# Patient Record
Sex: Female | Born: 1945 | Race: White | Hispanic: No | Marital: Married | Smoking: Former smoker
Health system: Southern US, Community
[De-identification: ages and names within clinical notes are randomized; demographics above are authoritative.]

## PROBLEM LIST (undated history)

## (undated) DIAGNOSIS — R55 Syncope and collapse: Secondary | ICD-10-CM

## (undated) DIAGNOSIS — I2510 Atherosclerotic heart disease of native coronary artery without angina pectoris: Secondary | ICD-10-CM

## (undated) DIAGNOSIS — J449 Chronic obstructive pulmonary disease, unspecified: Secondary | ICD-10-CM

## (undated) DIAGNOSIS — N183 Chronic kidney disease, stage 3 unspecified: Secondary | ICD-10-CM

## (undated) DIAGNOSIS — E119 Type 2 diabetes mellitus without complications: Secondary | ICD-10-CM

## (undated) DIAGNOSIS — I5042 Chronic combined systolic (congestive) and diastolic (congestive) heart failure: Secondary | ICD-10-CM

## (undated) DIAGNOSIS — I428 Other cardiomyopathies: Secondary | ICD-10-CM

## (undated) HISTORY — PX: APPENDECTOMY: SHX54

## (undated) HISTORY — PX: TUBAL LIGATION: SHX77

## (undated) HISTORY — DX: Chronic kidney disease, stage 3 (moderate): N18.3

## (undated) HISTORY — DX: Chronic kidney disease, stage 3 unspecified: N18.30

## (undated) HISTORY — PX: PACEMAKER INSERTION: SHX728

## (undated) HISTORY — DX: Chronic combined systolic (congestive) and diastolic (congestive) heart failure: I50.42

## (undated) HISTORY — PX: TONSILLECTOMY: SUR1361

---

## 2015-08-17 DIAGNOSIS — J449 Chronic obstructive pulmonary disease, unspecified: Secondary | ICD-10-CM | POA: Insufficient documentation

## 2015-08-17 DIAGNOSIS — Z87898 Personal history of other specified conditions: Secondary | ICD-10-CM | POA: Insufficient documentation

## 2015-09-30 DIAGNOSIS — E782 Mixed hyperlipidemia: Secondary | ICD-10-CM | POA: Insufficient documentation

## 2015-10-29 ENCOUNTER — Ambulatory Visit: Payer: Self-pay | Admitting: Cardiovascular Disease

## 2015-10-30 DIAGNOSIS — E1122 Type 2 diabetes mellitus with diabetic chronic kidney disease: Secondary | ICD-10-CM | POA: Insufficient documentation

## 2015-10-30 DIAGNOSIS — N183 Chronic kidney disease, stage 3 unspecified: Secondary | ICD-10-CM | POA: Insufficient documentation

## 2015-11-01 DIAGNOSIS — Z87898 Personal history of other specified conditions: Secondary | ICD-10-CM | POA: Insufficient documentation

## 2015-11-24 ENCOUNTER — Other Ambulatory Visit: Payer: Self-pay | Admitting: Internal Medicine

## 2015-11-24 DIAGNOSIS — R112 Nausea with vomiting, unspecified: Secondary | ICD-10-CM

## 2015-11-25 ENCOUNTER — Encounter: Payer: Self-pay | Admitting: Emergency Medicine

## 2015-11-25 ENCOUNTER — Inpatient Hospital Stay
Admission: EM | Admit: 2015-11-25 | Discharge: 2015-11-29 | DRG: 377 | Disposition: A | Discharge status: Home Health | Payer: Medicare Other | Attending: Internal Medicine | Admitting: Internal Medicine

## 2015-11-25 ENCOUNTER — Emergency Department: Payer: Medicare Other

## 2015-11-25 ENCOUNTER — Inpatient Hospital Stay: Payer: Medicare Other

## 2015-11-25 DIAGNOSIS — K2980 Duodenitis without bleeding: Secondary | ICD-10-CM | POA: Diagnosis present

## 2015-11-25 DIAGNOSIS — D62 Acute posthemorrhagic anemia: Secondary | ICD-10-CM | POA: Diagnosis present

## 2015-11-25 DIAGNOSIS — I5032 Chronic diastolic (congestive) heart failure: Secondary | ICD-10-CM | POA: Diagnosis present

## 2015-11-25 DIAGNOSIS — E875 Hyperkalemia: Secondary | ICD-10-CM | POA: Diagnosis present

## 2015-11-25 DIAGNOSIS — Z79899 Other long term (current) drug therapy: Secondary | ICD-10-CM | POA: Diagnosis not present

## 2015-11-25 DIAGNOSIS — R112 Nausea with vomiting, unspecified: Secondary | ICD-10-CM

## 2015-11-25 DIAGNOSIS — J449 Chronic obstructive pulmonary disease, unspecified: Secondary | ICD-10-CM | POA: Diagnosis present

## 2015-11-25 DIAGNOSIS — E1122 Type 2 diabetes mellitus with diabetic chronic kidney disease: Secondary | ICD-10-CM | POA: Diagnosis present

## 2015-11-25 DIAGNOSIS — K2960 Other gastritis without bleeding: Secondary | ICD-10-CM | POA: Diagnosis present

## 2015-11-25 DIAGNOSIS — K921 Melena: Principal | ICD-10-CM | POA: Diagnosis present

## 2015-11-25 DIAGNOSIS — N183 Chronic kidney disease, stage 3 (moderate): Secondary | ICD-10-CM | POA: Diagnosis present

## 2015-11-25 DIAGNOSIS — Z9981 Dependence on supplemental oxygen: Secondary | ICD-10-CM | POA: Diagnosis not present

## 2015-11-25 DIAGNOSIS — Z950 Presence of cardiac pacemaker: Secondary | ICD-10-CM

## 2015-11-25 DIAGNOSIS — Z794 Long term (current) use of insulin: Secondary | ICD-10-CM | POA: Diagnosis not present

## 2015-11-25 DIAGNOSIS — T460X5A Adverse effect of cardiac-stimulant glycosides and drugs of similar action, initial encounter: Secondary | ICD-10-CM | POA: Diagnosis present

## 2015-11-25 DIAGNOSIS — Z87891 Personal history of nicotine dependence: Secondary | ICD-10-CM

## 2015-11-25 DIAGNOSIS — I509 Heart failure, unspecified: Secondary | ICD-10-CM

## 2015-11-25 DIAGNOSIS — Y92009 Unspecified place in unspecified non-institutional (private) residence as the place of occurrence of the external cause: Secondary | ICD-10-CM

## 2015-11-25 DIAGNOSIS — D649 Anemia, unspecified: Secondary | ICD-10-CM | POA: Diagnosis present

## 2015-11-25 DIAGNOSIS — Z7982 Long term (current) use of aspirin: Secondary | ICD-10-CM | POA: Diagnosis not present

## 2015-11-25 DIAGNOSIS — E1310 Other specified diabetes mellitus with ketoacidosis without coma: Secondary | ICD-10-CM

## 2015-11-25 DIAGNOSIS — N170 Acute kidney failure with tubular necrosis: Secondary | ICD-10-CM | POA: Diagnosis present

## 2015-11-25 HISTORY — DX: Chronic obstructive pulmonary disease, unspecified: J44.9

## 2015-11-25 HISTORY — DX: Syncope and collapse: R55

## 2015-11-25 HISTORY — DX: Type 2 diabetes mellitus without complications: E11.9

## 2015-11-25 LAB — COMPREHENSIVE METABOLIC PANEL
ALBUMIN: 2.7 g/dL — AB (ref 3.5–5.0)
ALK PHOS: 46 U/L (ref 38–126)
ALT: 20 U/L (ref 14–54)
AST: 24 U/L (ref 15–41)
Anion gap: 9 (ref 5–15)
BILIRUBIN TOTAL: 0.7 mg/dL (ref 0.3–1.2)
BUN: 73 mg/dL — AB (ref 6–20)
CO2: 26 mmol/L (ref 22–32)
Calcium: 8.9 mg/dL (ref 8.9–10.3)
Chloride: 105 mmol/L (ref 101–111)
Creatinine, Ser: 2.65 mg/dL — ABNORMAL HIGH (ref 0.44–1.00)
GFR calc Af Amer: 20 mL/min — ABNORMAL LOW (ref >60)
GFR calc non Af Amer: 17 mL/min — ABNORMAL LOW (ref >60)
GLUCOSE: 100 mg/dL — AB (ref 65–99)
POTASSIUM: 5.8 mmol/L — AB (ref 3.5–5.1)
Sodium: 140 mmol/L (ref 135–145)
TOTAL PROTEIN: 6.5 g/dL (ref 6.5–8.1)

## 2015-11-25 LAB — CBC
HEMATOCRIT: 21.2 % — AB (ref 35.0–47.0)
Hemoglobin: 6.9 g/dL — ABNORMAL LOW (ref 12.0–16.0)
MCH: 28.2 pg (ref 26.0–34.0)
MCHC: 32.5 g/dL (ref 32.0–36.0)
MCV: 86.6 fL (ref 80.0–100.0)
Platelets: 232 10*3/uL (ref 150–440)
RBC: 2.45 MIL/uL — ABNORMAL LOW (ref 3.80–5.20)
RDW: 17.2 % — AB (ref 11.5–14.5)
WBC: 7.8 10*3/uL (ref 3.6–11.0)

## 2015-11-25 LAB — ABO/RH: ABO/RH(D): O NEG

## 2015-11-25 LAB — PROTIME-INR
INR: 1.14
PROTHROMBIN TIME: 14.8 s (ref 11.4–15.0)

## 2015-11-25 LAB — DIGOXIN LEVEL: DIGOXIN LVL: 2.6 ng/mL — AB (ref 0.8–2.0)

## 2015-11-25 LAB — GLUCOSE, CAPILLARY
GLUCOSE-CAPILLARY: 51 mg/dL — AB (ref 65–99)
GLUCOSE-CAPILLARY: 132 mg/dL — AB (ref 65–99)

## 2015-11-25 LAB — PREPARE RBC (CROSSMATCH)

## 2015-11-25 LAB — TROPONIN I

## 2015-11-25 MED ORDER — PANTOPRAZOLE SODIUM 40 MG IV SOLR: 40.0000 mg | Freq: Two times a day (BID) | INTRAVENOUS | Status: DC

## 2015-11-25 MED ORDER — INSULIN ASPART 100 UNIT/ML ~~LOC~~ SOLN
0.0000 [IU] | Freq: Every day | SUBCUTANEOUS | Status: DC
  Administered 2015-11-27 – 2015-11-28 (×2): 2 [IU] via SUBCUTANEOUS
  Filled 2015-11-25 (×2): qty 2

## 2015-11-25 MED ORDER — ATORVASTATIN CALCIUM 20 MG PO TABS
80.0000 mg | ORAL_TABLET | Freq: Every day | ORAL | Status: DC
  Administered 2015-11-25 – 2015-11-28 (×4): 80 mg via ORAL
  Filled 2015-11-25 (×4): qty 4

## 2015-11-25 MED ORDER — SODIUM CHLORIDE 0.9 % IV SOLN
1.0000 g | Freq: Once | INTRAVENOUS | Status: AC
  Administered 2015-11-25: 1 g via INTRAVENOUS
  Filled 2015-11-25: qty 10

## 2015-11-25 MED ORDER — SODIUM CHLORIDE 0.9 % IV SOLN
80.0000 mg | Freq: Once | INTRAVENOUS | Status: AC
  Administered 2015-11-25: 80 mg via INTRAVENOUS
  Filled 2015-11-25: qty 80

## 2015-11-25 MED ORDER — SODIUM CHLORIDE 0.9 % IV SOLN
8.0000 mg/h | INTRAVENOUS | Status: DC
  Administered 2015-11-25 – 2015-11-28 (×4): 8 mg/h via INTRAVENOUS
  Filled 2015-11-25 (×6): qty 80

## 2015-11-25 MED ORDER — ONDANSETRON HCL 4 MG/2ML IJ SOLN
4.0000 mg | Freq: Four times a day (QID) | INTRAMUSCULAR | Status: DC | PRN
Start: 2015-11-25 — End: 2015-11-29

## 2015-11-25 MED ORDER — SODIUM CHLORIDE 0.9% FLUSH
3.0000 mL | Freq: Two times a day (BID) | INTRAVENOUS | Status: DC
  Administered 2015-11-26 – 2015-11-29 (×5): 3 mL via INTRAVENOUS

## 2015-11-25 MED ORDER — ONDANSETRON HCL 4 MG PO TABS: 4.0000 mg | ORAL_TABLET | Freq: Four times a day (QID) | ORAL | Status: DC | PRN

## 2015-11-25 MED ORDER — ACETAMINOPHEN 325 MG PO TABS: 650.0000 mg | ORAL_TABLET | Freq: Four times a day (QID) | ORAL | Status: DC | PRN

## 2015-11-25 MED ORDER — SODIUM CHLORIDE 0.9 % IV SOLN
10.0000 mL/h | Freq: Once | INTRAVENOUS | Status: AC
  Administered 2015-11-25: 10 mL/h via INTRAVENOUS

## 2015-11-25 MED ORDER — INSULIN ASPART 100 UNIT/ML IV SOLN
10.0000 [IU] | Freq: Once | INTRAVENOUS | Status: AC
  Administered 2015-11-25: 10 [IU] via INTRAVENOUS
  Filled 2015-11-25: qty 0.1

## 2015-11-25 MED ORDER — VITAMIN B-12 1000 MCG PO TABS
1000.0000 ug | ORAL_TABLET | Freq: Every day | ORAL | Status: DC
  Administered 2015-11-25 – 2015-11-29 (×3): 1000 ug via ORAL
  Filled 2015-11-25 (×4): qty 1

## 2015-11-25 MED ORDER — INSULIN ASPART 100 UNIT/ML ~~LOC~~ SOLN
0.0000 [IU] | Freq: Three times a day (TID) | SUBCUTANEOUS | Status: DC
  Administered 2015-11-27 – 2015-11-28 (×2): 2 [IU] via SUBCUTANEOUS
  Administered 2015-11-28: 3 [IU] via SUBCUTANEOUS
  Administered 2015-11-28: 2 [IU] via SUBCUTANEOUS
  Administered 2015-11-29 (×3): 3 [IU] via SUBCUTANEOUS
  Filled 2015-11-25: qty 3
  Filled 2015-11-25: qty 2
  Filled 2015-11-25: qty 3
  Filled 2015-11-25 (×3): qty 2
  Filled 2015-11-25: qty 3

## 2015-11-25 MED ORDER — DEXTROSE 50 % IV SOLN
50.0000 mL | Freq: Once | INTRAVENOUS | Status: AC
  Administered 2015-11-25: 50 mL via INTRAVENOUS
  Filled 2015-11-25: qty 50

## 2015-11-25 MED ORDER — SODIUM CHLORIDE 0.9 % IV SOLN
INTRAVENOUS | Status: DC
  Administered 2015-11-25 – 2015-11-26 (×2): via INTRAVENOUS

## 2015-11-25 MED ORDER — ACETAMINOPHEN 650 MG RE SUPP: 650.0000 mg | Freq: Four times a day (QID) | RECTAL | Status: DC | PRN

## 2015-11-25 MED ORDER — IPRATROPIUM-ALBUTEROL 0.5-2.5 (3) MG/3ML IN SOLN
3.0000 mL | Freq: Four times a day (QID) | RESPIRATORY_TRACT | Status: DC | PRN
  Administered 2015-11-27: 3 mL via RESPIRATORY_TRACT

## 2015-11-25 MED ORDER — INSULIN REGULAR HUMAN 100 UNIT/ML IJ SOLN: 10.0000 [IU] | Freq: Once | INTRAMUSCULAR | Status: DC

## 2015-11-25 NOTE — ED Notes (Signed)
Patient transported to CT 

## 2015-11-25 NOTE — ED Notes (Signed)
Patient transported to x-ray. ?

## 2015-11-25 NOTE — ED Notes (Signed)
Pharmacy called and notified of need of protonix. States they will send. Patient made aware.

## 2015-11-25 NOTE — ED Notes (Signed)
Patient presents to the ED with a lack of appetite per husband and stating, "she just seems out of it, not fully awake."  Husband states patient has been sick for a long time and just left [facility] 2-3 weeks ago.  Husband states  the lack of appetite  has been going on for about 2 weeks and patient told him that her stool was dark yesterday.  Patient's mucous membranes appear pale as well as patient's skin.  Patient appears very tired in triage.  Patient is able to answer questions when asked.

## 2015-11-25 NOTE — H&P (Signed)
[facility] NAME: Teresa Mercado    MR#:  [id number]  DATE OF BIRTH:  1946-03-23  DATE OF ADMISSION:  11/25/2015  PRIMARY CARE PHYSICIAN: Idelle Crouch, MD   REQUESTING/REFERRING PHYSICIAN: Dr. Charlotte Crumb  CHIEF COMPLAINT:   Chief Complaint  Patient presents with  . Altered Mental Status    HISTORY OF PRESENT ILLNESS:  Teresa Mercado  is a 70 y.o. female with a known history of diastolic CHF, COPD, diabetes mellitus, CK D stage III with baseline creatinine around 1.6, complete heart block with a pacemaker presents to the hospital secondary to dark stools and worsening nausea and vomiting for 4 days now. Patient has been having indigestion symptoms for over a month now. But she's been started having nausea and vomiting up until 4 days ago. She saw her PCP for this same 2 days ago and was ordered and after sound of the liver and also GI follow up for an endoscopy. This morning she noted that she was having dark stools with worsening abdominal pain, periumbilical mostly. She was extremely weak and also appearing pale and so was brought to the emergency room. Baseline hemoglobin seems to be around 10.5-11. Hemoglobin 2 days ago was 11.0 and now it's down to 6.9. Blood pressure is stable. No other source of active bleeding noted.  Also labs indicate acute on chronic renal failure. Digoxin level is pending.  PAST MEDICAL HISTORY:   Past Medical History  Diagnosis Date  . COPD (chronic obstructive pulmonary disease) (HCC)     Not on home o2  . CHF (congestive heart failure) (HCC)     Diastolic, last ECHO from Jan 2017- normal EF 55%  . Diabetes mellitus without complication ([place])     On Levemir  . Renal disorder     CKD stage 3  . Syncope     s/p pacemaker    PAST SURGICAL HISTORY:   Past Surgical History  Procedure Laterality Date  . Pacemaker insertion    . Appendectomy    . Tonsillectomy    . Tubal ligation       SOCIAL HISTORY:   Social History  Substance Use Topics  . Smoking status: Former Smoker -- 2.00 packs/day    Types: Cigarettes    Quit date: 11/24/2005  . Smokeless tobacco: Not on file     Comment: Quit 10 years ago, but has 45 years smoking prior to that  . Alcohol Use: No    FAMILY HISTORY:   Family History  Problem Relation Age of Onset  . Kidney cancer Father   . Esophageal cancer Brother     DRUG ALLERGIES:   Allergies  Allergen Reactions  . Quinapril Other (See Comments)    Reaction:  Elevated potassium   . Venlafaxine Other (See Comments)    Reaction:  GI upset   . Zetia [Ezetimibe] Nausea And Vomiting    REVIEW OF SYSTEMS:   Review of Systems  Constitutional: Positive for malaise/fatigue. Negative for fever, chills and weight loss.  HENT: Negative for ear discharge, ear pain, hearing loss and nosebleeds.   Eyes: Negative for blurred vision, double vision and photophobia.  Respiratory: Negative for cough, hemoptysis, shortness of breath and wheezing.   Cardiovascular: Negative for chest pain, palpitations, orthopnea and leg swelling.  Gastrointestinal: Positive for nausea, vomiting, abdominal pain, blood in stool and melena. Negative for heartburn, diarrhea and constipation.  Genitourinary: Negative for dysuria, urgency, frequency and hematuria.  Musculoskeletal:  Positive for myalgias. Negative for back pain and neck pain.  Skin: Negative for rash.  Neurological: Positive for weakness. Negative for dizziness, tingling, tremors, sensory change, speech change, focal weakness and headaches.  Endo/Heme/Allergies: Does not bruise/bleed easily.  Psychiatric/Behavioral: Negative for depression.    MEDICATIONS AT HOME:   Prior to Admission medications   Medication Sig Start Date End Date Taking? Authorizing Provider  aspirin EC 325 MG tablet Take 325 mg by mouth daily.   Yes Historical Provider, MD  atorvastatin (LIPITOR) 80 MG tablet Take 80 mg by mouth at  bedtime.   Yes Historical Provider, MD  digoxin (LANOXIN) 0.125 MG tablet Take 0.125 mg by mouth daily.   Yes Historical Provider, MD  furosemide (LASIX) 40 MG tablet Take 40 mg by mouth daily.   Yes Historical Provider, MD  glimepiride (AMARYL) 4 MG tablet Take 4 mg by mouth daily with breakfast.   Yes Historical Provider, MD  insulin detemir (LEVEMIR) 100 UNIT/ML injection Inject 32 Units into the skin at bedtime.   Yes Historical Provider, MD  ipratropium-albuterol (DUONEB) 0.5-2.5 (3) MG/3ML SOLN Take 3 mLs by nebulization every 6 (six) hours as needed (for wheezing/shortness of breath).   Yes Historical Provider, MD  lisinopril (PRINIVIL,ZESTRIL) 10 MG tablet Take 10 mg by mouth daily.   Yes Historical Provider, MD  metFORMIN (GLUCOPHAGE) 500 MG tablet Take 1,000 mg by mouth 2 (two) times daily with a meal.   Yes Historical Provider, MD  metoprolol tartrate (LOPRESSOR) 25 MG tablet Take 25 mg by mouth 2 (two) times daily.   Yes Historical Provider, MD  ondansetron (ZOFRAN) 4 MG tablet Take 4 mg by mouth every 8 (eight) hours as needed for nausea or vomiting.   Yes Historical Provider, MD  pantoprazole (PROTONIX) 40 MG tablet Take 40 mg by mouth daily.   Yes Historical Provider, MD  sitaGLIPtin (JANUVIA) 100 MG tablet Take 100 mg by mouth daily.   Yes Historical Provider, MD  vitamin B-12 (CYANOCOBALAMIN) 1000 MCG tablet Take 1,000 mcg by mouth daily.   Yes Historical Provider, MD      VITAL SIGNS:  Blood pressure 104/57, pulse 84, temperature 98 F (36.7 C), temperature source Oral, resp. rate 19, height 5\' 3"  (1.6 m), weight 106.595 kg (235 lb), SpO2 100 %.  PHYSICAL EXAMINATION:   Physical Exam  GENERAL:  70 y.o.-year-old patient lying in the bed with no acute distress. Pale appearing EYES: Pupils equal, round, reactive to light and accommodation. No scleral icterus. Extraocular muscles intact.  HEENT: Head atraumatic, normocephalic. Oropharynx and nasopharynx clear.  Edentulous NECK:  Supple, no jugular venous distention. No thyroid enlargement, no tenderness.  LUNGS: Normal breath sounds bilaterally, no wheezing, rales,rhonchi or crepitation. No use of accessory muscles of respiration. Decreased bibasilar breath sounds. CARDIOVASCULAR: S1, S2 normal. No rubs, or gallops. 2/6 systolic murmur heard ABDOMEN: Soft, generalized discomfort on abdominal palpation, no guarding or rigidity, nondistended. Bowel sounds present. No organomegaly or mass.  EXTREMITIES: No cyanosis, or clubbing. 1+ pedal edema present. NEUROLOGIC: Cranial nerves II through XII are intact. Muscle strength 5/5 in all extremities. Sensation intact. Gait not checked.  PSYCHIATRIC: The patient is alert and oriented x 3.  SKIN: No obvious rash, lesion, or ulcer.   LABORATORY PANEL:   CBC  Recent Labs Lab 11/25/15 1457  WBC 7.8  HGB 6.9*  HCT 21.2*  PLT 232   ------------------------------------------------------------------------------------------------------------------  Chemistries   Recent Labs Lab 11/25/15 1457  NA 140  K 5.8*  CL 105  CO2 26  GLUCOSE 100*  BUN 73*  CREATININE 2.65*  CALCIUM 8.9  AST 24  ALT 20  ALKPHOS 46  BILITOT 0.7   ------------------------------------------------------------------------------------------------------------------  Cardiac Enzymes  Recent Labs Lab 11/25/15 1457  TROPONINI <0.03   ------------------------------------------------------------------------------------------------------------------  RADIOLOGY:  Dg Chest 2 View  11/25/2015  CLINICAL DATA:  Short of breath for several days. EXAM: CHEST  2 VIEW COMPARISON:  10/30/2015 FINDINGS: Hyperexpansion is consistent with emphysema. The lungs are clear wiithout focal pneumonia, edema, pneumothorax or pleural effusion. Interstitial markings are diffusely coarsened with chronic features. The cardio pericardial silhouette is enlarged. The visualized bony structures of the  thorax are intact. Left-sided permanent pacemaker remains in place. IMPRESSION: No active cardiopulmonary disease. Electronically Signed   By: Misty Stanley M.D.   On: 11/25/2015 17:55    EKG:   Orders placed or performed during the hospital encounter of 11/25/15  . ED EKG  . ED EKG  . EKG 12-Lead  . EKG 12-Lead    IMPRESSION AND PLAN:   Aleysha Lakin  is a 70 y.o. female with a known history of diastolic CHF, COPD, diabetes mellitus, CK D stage III with baseline creatinine around 1.6, complete heart block with a pacemaker presents to the hospital secondary to dark stools and worsening nausea and vomiting for 4 days now.  #1 Anemia- acute anemia- likely GI source- melena with nausea, vomiting - transfuse 2 units now and check hb q8h, no active bleeding, vitals stable now - GI consulted - CT abd f/u - also started on protonix drip  #2 ARF on CKD stage 3- ATN likely - Hold nephrotoxins, IV fluids, f/u on kidneys after CT abd - baseline cr around 1.6, now at 2.6 - monitor I&O  #3 Diastolic CHF- chronic- hold lasix Monitor on tele, f/u CXR  #4 Hyperkalemia- could be from digoxin toxicity in the setting of ARF - hold lisinopril, digoxin - calcium gluconate and insulin, dextrose ordered - hold kayexalate in the setting of GI bleed- recheck K+ again  #5 DM- hold levemir, metformin and Januvia as NPO On SSI  #6 DVT Prophylaxis- TEDs and SCDs    All the records are reviewed and case discussed with ED provider. Management plans discussed with the patient, family and they are in agreement.  CODE STATUS: Full Code  TOTAL CRITICAL CARE TIME SPENT IN TAKING CARE OF THIS PATIENT: 60 minutes.    Gladstone Lighter M.D on 11/25/2015 at 6:10 PM  Between 7am to 6pm - Pager - [phone]  After 6pm go to [email or web address] - password [email or web address] [facility]  Office  [phone]  CC: Primary care physician; Idelle Crouch, MD

## 2015-11-25 NOTE — Progress Notes (Signed)
Called Dr. To notify that patients BP was low even with blood and fluids running. He said to monitor and call back once blood was finished running. Will continue to monitor. Patient resting with no complaints.

## 2015-11-25 NOTE — ED Provider Notes (Addendum)
[facility] Emergency Department Provider Note  ____________________________________________   I have reviewed the triage vital signs and the nursing notes.   HISTORY  Chief Complaint Altered Mental Status    HPI Teresa Mercado is a 70 y.o. female with a history of reflux disease, CHF, on aspirin every day, as well as diabetes and renal disorder presents today feeling generally weak which is beginning gradually worse last couple weeks. She denies any fever or chills. She denies any CHF symptoms. She states she feels short of breath because she is weak not because of fluid in her lungs to the extent that she can tell the difference. She denies any lower extremity swelling. She states that she did have a black stool yesterday. That is the first time she has noticed it. Her family feels that she looks somewhat pale. No fever no chills. No other anticoagulation aside from aspirin. She does not recollect having a GI procedure in the past.  Past Medical History  Diagnosis Date  . COPD (chronic obstructive pulmonary disease) ([place])   . CHF (congestive heart failure) ([place])   . Diabetes mellitus without complication ([place])   . Renal disorder     There are no active problems to display for this patient.   Past Surgical History  Procedure Laterality Date  . Pacemaker insertion    . Appendectomy    . Tonsillectomy    . Tubal ligation      No current outpatient prescriptions on file.  Allergies Review of patient's allergies indicates not on file.  No family history on file.  Social History Social History  Substance Use Topics  . Smoking status: Former Smoker    Quit date: 11/24/2005  . Smokeless tobacco: None  . Alcohol Use: No    Review of Systems Constitutional: No fever/chills Eyes: No visual changes. ENT: No sore throat. No stiff neck no neck pain Cardiovascular: Denies chest pain. Respiratory: See history of present illness Gastrointestinal:   no  vomiting.  No diarrhea.  No constipation. Genitourinary: Negative for dysuria. Musculoskeletal: Negative lower extremity swelling Skin: Negative for rash. Neurological: Negative for headaches, focal weakness or numbness. 10-point ROS otherwise negative.  ____________________________________________   PHYSICAL EXAM:  VITAL SIGNS: ED Triage Vitals  Enc Vitals Group     BP 11/25/15 1447 110/81 mmHg     Pulse Rate 11/25/15 1445 77     Resp --      Temp 11/25/15 1445 99.1 F (37.3 C)     Temp Source 11/25/15 1445 Oral     SpO2 11/25/15 1445 96 %     Weight 11/25/15 1445 235 lb (106.595 kg)     Height 11/25/15 1445 5\' 3"  (1.6 m)     Head Cir --      Peak Flow --      Pain Score 11/25/15 1446 5     Pain Loc --      Pain Edu? --      Excl. in [place]? --     Constitutional: Alert and oriented. Pale and generally weak appearing but otherwise in no acute medical distress at this moment Eyes: Conjunctivae are normal. PERRL. EOMI. Head: Atraumatic. Nose: No congestion/rhinnorhea. Mouth/Throat: Mucous membranes are moist.  Oropharynx non-erythematous. Neck: No stridor.   Nontender with no meningismus Cardiovascular: Normal rate, regular rhythm. Grossly normal heart sounds.  Good peripheral circulation. Respiratory: Normal respiratory effort.  No retractions. Lungs CTAB. Abdominal: Soft and nontender. No distention. No guarding no rebound Back:  There  is no focal tenderness or step off there is no midline tenderness there are no lesions noted. there is no CVA tenderness Rectal exam: Female nurse chaperone present, Black guaiac positive stool noted. Musculoskeletal: No lower extremity tenderness. No joint effusions, no DVT signs strong distal pulses no edema Neurologic:  Normal speech and language. No gross focal neurologic deficits are appreciated.  Skin:  Skin is warm, dry and intact. No rash noted. Psychiatric: Mood and affect are normal. Speech and behavior are  normal.  ____________________________________________   LABS (all labs ordered are listed, but only abnormal results are displayed)  Labs Reviewed  COMPREHENSIVE METABOLIC PANEL - Abnormal; Notable for the following:    Potassium 5.8 (*)    Glucose, Bld 100 (*)    BUN 73 (*)    Creatinine, Ser 2.65 (*)    Albumin 2.7 (*)    GFR calc non Af Amer 17 (*)    GFR calc Af Amer 20 (*)    All other components within normal limits  CBC - Abnormal; Notable for the following:    RBC 2.45 (*)    Hemoglobin 6.9 (*)    HCT 21.2 (*)    RDW 17.2 (*)    All other components within normal limits  PROTIME-INR  DIGOXIN LEVEL  TYPE AND SCREEN  ABO/RH   ____________________________________________  EKG  I personally interpreted any EKGs ordered by me or triage Ventricularly paced rhythm at 80 bpm ____________________________________________  RADIOLOGY  I reviewed any imaging ordered by me or triage that were performed during my shift and, if possible, patient and/or family made aware of any abnormal findings. ____________________________________________   PROCEDURES  Procedure(s) performed: None  Critical Care performed: CRITICAL CARE Performed by: Schuyler Amor   Total critical care time: 44 minutes  Critical care time was exclusive of separately billable procedures and treating other patients.  Critical care was necessary to treat or prevent imminent or life-threatening deterioration.  Critical care was time spent personally by me on the following activities: development of treatment plan with patient and/or surrogate as well as nursing, discussions with consultants, evaluation of patient's response to treatment, examination of patient, obtaining history from patient or surrogate, ordering and performing treatments and interventions, ordering and review of laboratory studies, ordering and review of radiographic studies, pulse oximetry and re-evaluation of patient's  condition.   ____________________________________________   INITIAL IMPRESSION / ASSESSMENT AND PLAN / ED COURSE  Pertinent labs & imaging results that were available during my care of the patient were reviewed by me and considered in my medical decision making (see chart for details).  Patient with dark rectal bleeding and a hemoglobin of the significant lower than baseline and feeling weak. Vital signs are stable likely this is been a gradual progression. We will admit her to the hospital start her on telemetry strip as a suspect this is an upper GI bleed. ____________________________________________   FINAL CLINICAL IMPRESSION(S) / ED DIAGNOSES  Final diagnoses:  None      This chart was dictated using voice recognition software.  Despite best efforts to proofread,  errors can occur which can change meaning.     Schuyler Amor, MD 11/25/15 1633  Schuyler Amor, MD 11/25/15 1700

## 2015-11-26 LAB — CBC
HEMATOCRIT: 24.9 % — AB (ref 35.0–47.0)
Hemoglobin: 8.2 g/dL — ABNORMAL LOW (ref 12.0–16.0)
MCH: 28.5 pg (ref 26.0–34.0)
MCHC: 33 g/dL (ref 32.0–36.0)
MCV: 86.4 fL (ref 80.0–100.0)
Platelets: 194 10*3/uL (ref 150–440)
RBC: 2.88 MIL/uL — ABNORMAL LOW (ref 3.80–5.20)
RDW: 16.2 % — AB (ref 11.5–14.5)
WBC: 6.4 10*3/uL (ref 3.6–11.0)

## 2015-11-26 LAB — HEMOGLOBIN
HEMOGLOBIN: 8.4 g/dL — AB (ref 12.0–16.0)
Hemoglobin: 8.9 g/dL — ABNORMAL LOW (ref 12.0–16.0)

## 2015-11-26 LAB — TYPE AND SCREEN
ABO/RH(D): O NEG
ANTIBODY SCREEN: NEGATIVE
Unit division: 0
Unit division: 0

## 2015-11-26 LAB — BASIC METABOLIC PANEL
Anion gap: 5 (ref 5–15)
BUN: 58 mg/dL — AB (ref 6–20)
CALCIUM: 8.3 mg/dL — AB (ref 8.9–10.3)
CO2: 27 mmol/L (ref 22–32)
CREATININE: 2.27 mg/dL — AB (ref 0.44–1.00)
Chloride: 112 mmol/L — ABNORMAL HIGH (ref 101–111)
GFR calc Af Amer: 24 mL/min — ABNORMAL LOW (ref >60)
GFR calc non Af Amer: 21 mL/min — ABNORMAL LOW (ref >60)
GLUCOSE: 53 mg/dL — AB (ref 65–99)
Potassium: 5.3 mmol/L — ABNORMAL HIGH (ref 3.5–5.1)
Sodium: 144 mmol/L (ref 135–145)

## 2015-11-26 LAB — GLUCOSE, CAPILLARY
GLUCOSE-CAPILLARY: 125 mg/dL — AB (ref 65–99)
Glucose-Capillary: 44 mg/dL — CL (ref 65–99)
Glucose-Capillary: 137 mg/dL — ABNORMAL HIGH (ref 65–99)
Glucose-Capillary: 94 mg/dL (ref 65–99)
Glucose-Capillary: 129 mg/dL — ABNORMAL HIGH (ref 65–99)

## 2015-11-26 LAB — DIGOXIN LEVEL: Digoxin Level: 2.2 ng/mL — ABNORMAL HIGH (ref 0.8–2.0)

## 2015-11-26 LAB — HEMOGLOBIN A1C: Hgb A1c MFr Bld: 6.6 % — ABNORMAL HIGH (ref 4.0–6.0)

## 2015-11-26 MED ORDER — DEXTROSE 50 % IV SOLN
1.0000 | Freq: Once | INTRAVENOUS | Status: AC
  Administered 2015-11-26: 50 mL via INTRAVENOUS

## 2015-11-26 MED ORDER — DEXTROSE 50 % IV SOLN
INTRAVENOUS | Status: AC
  Administered 2015-11-26: 08:00:00
  Filled 2015-11-26: qty 50

## 2015-11-26 MED ORDER — DEXTROSE-NACL 5-0.9 % IV SOLN
INTRAVENOUS | Status: DC
  Administered 2015-11-26 – 2015-11-27 (×2): via INTRAVENOUS

## 2015-11-26 NOTE — Progress Notes (Signed)
Inpatient Diabetes Program Recommendations  AACE/ADA: New Consensus Statement on Inpatient Glycemic Control (2015)  Target Ranges:  Prepandial:   less than 140 mg/dL      Peak postprandial:   less than 180 mg/dL (1-2 hours)      Critically ill patients:  140 - 180 mg/dL   Review of Glycemic Control  Results for Teresa Mercado, Teresa Mercado (MRN [id number]) as of 11/26/2015 09:50  Ref. Range 11/25/2015 20:12 11/25/2015 21:24 11/26/2015 07:26 11/26/2015 08:13  Glucose-Capillary Latest Ref Range: 65-99 mg/dL 51 (L) 132 (H) 44 (LL) 137 (H)    Diabetes history: Type 2 Outpatient Diabetes medications: Amaryl 4mg  qam, Levemir 32 units at hs, Metformin 1000mg  bid, Januvia 100mg /day Current orders for Inpatient glycemic control: Novolog 0-9 units tid, Novolog 0-5 units qhs  Inpatient Diabetes Program Recommendations:  Agree with current orders for diabetes management.  Gentry Fitz, RN, BA, MHA, CDE Diabetes Coordinator Inpatient Diabetes Program  [phone] (Team Pager) [phone] ([place]) 11/26/2015 10:21 AM

## 2015-11-26 NOTE — Progress Notes (Signed)
Patient's CBG 44 this AM. Was alert and oriented and talking to me during report this AM. Given 1 amp of d50 due to patient being NPO. Dr. Posey Pronto notified. CBG came up to 137 and patient is doing well. Will continue to monitor.

## 2015-11-26 NOTE — Consult Note (Signed)
Please see full GI consult note by Ms Constance Haw. Patietn seen and examined chart reviewed.  Patient admitted with anemia, n+v, and report of black stool. Patient taking a 325 mg asa daily, unsure of other meds. No apparent h/o pudz.  Patient is not microcytic, and bun improving.  No evidence of ongoing obvious GI bleeding, hemodynamically stable.    Recommend continue ppi as you are, serial hgb, transfuse as needed, will plan for EGD tomorrow.  I have discussed the risks benefits and complications of procedures to include not limited to bleeding, infection, perforation and the risk of sedation and the patient wishes to proceed. Case discussed with patient and he caretaker/daughter.

## 2015-11-26 NOTE — Consult Note (Signed)
GI Inpatient Consult Note  Reason for Consult: Anemia    Attending Requesting Consult: Dr. Tressia Miners   History of Present Illness: Teresa Mercado is a 70 y.o. female seen for evaluation of Dr. Tressia Miners at the request of anemia. PMHx of CHF, COPD, DM, CKD stage III, complete heart block.     She is a fairly poor historian.  Most of the history is obtained via chart review.  She does not have any family at bedside.  Per patient she has had nausea and vomiting for a couple days, she is really rather unsure when this started but it seems better at this point. Denies nausea and vomiting today. No hx of hematemesis. She also reports a history of abdominal pain but unable to say if that started with the vomiting.  She denies any abdominal pain currently  She reports her biggest symptoms as weakness and shortness of breath.  She denies any chronic GERD symptoms and dysphagia.  She is unsure how often she has a bowel movement, states approximately daily.  Unsure as to color stool, per chart review these have been black.  She does take aspirin 325 mg which she has been on many years.  She denies any history of upper endoscopy or colonoscopy. She uses alk seltizer but denies other NSAIDs.   Last Colonoscopy: None prior Last Endoscopy: None prior   Past Medical History:  Past Medical History  Diagnosis Date  . COPD (chronic obstructive pulmonary disease) (HCC)     Not on home o2  . CHF (congestive heart failure) (HCC)     Diastolic, last ECHO from Jan 2017- normal EF 55%  . Diabetes mellitus without complication ([place])     On Levemir  . Renal disorder     CKD stage 3  . Syncope     s/p pacemaker    Problem List: Patient Active Problem List   Diagnosis Date Noted  . Anemia 11/25/2015    Past Surgical History: Past Surgical History  Procedure Laterality Date  . Pacemaker insertion    . Appendectomy    . Tonsillectomy    . Tubal ligation      Allergies: Allergies  Allergen Reactions  .  Quinapril Other (See Comments)    Reaction:  Elevated potassium   . Venlafaxine Other (See Comments)    Reaction:  GI upset   . Zetia [Ezetimibe] Nausea And Vomiting    Home Medications: Prescriptions prior to admission  Medication Sig Dispense Refill Last Dose  . aspirin EC 325 MG tablet Take 325 mg by mouth daily.   11/24/2015 at [place]   . atorvastatin (LIPITOR) 80 MG tablet Take 80 mg by mouth at bedtime.   11/24/2015 at Unknown time  . digoxin (LANOXIN) 0.125 MG tablet Take 0.125 mg by mouth daily.   11/24/2015 at [place]   . furosemide (LASIX) 40 MG tablet Take 40 mg by mouth daily.   11/24/2015 at Unknown time  . glimepiride (AMARYL) 4 MG tablet Take 4 mg by mouth daily with breakfast.   11/24/2015 at Unknown time  . insulin detemir (LEVEMIR) 100 UNIT/ML injection Inject 32 Units into the skin at bedtime.   11/24/2015 at Unknown time  . ipratropium-albuterol (DUONEB) 0.5-2.5 (3) MG/3ML SOLN Take 3 mLs by nebulization every 6 (six) hours as needed (for wheezing/shortness of breath).   11/24/2015 at Unknown time  . lisinopril (PRINIVIL,ZESTRIL) 10 MG tablet Take 10 mg by mouth daily.   11/24/2015 at Unknown time  . metFORMIN (GLUCOPHAGE) 500  MG tablet Take 1,000 mg by mouth 2 (two) times daily with a meal.   11/24/2015 at Unknown time  . metoprolol tartrate (LOPRESSOR) 25 MG tablet Take 25 mg by mouth 2 (two) times daily.   11/24/2015 at [place]   . ondansetron (ZOFRAN) 4 MG tablet Take 4 mg by mouth every 8 (eight) hours as needed for nausea or vomiting.   11/24/2015 at Unknown time  . pantoprazole (PROTONIX) 40 MG tablet Take 40 mg by mouth daily.   11/24/2015 at Unknown time  . sitaGLIPtin (JANUVIA) 100 MG tablet Take 100 mg by mouth daily.   11/24/2015 at Unknown time  . vitamin B-12 (CYANOCOBALAMIN) 1000 MCG tablet Take 1,000 mcg by mouth daily.   11/24/2015 at Unknown time   Home medication reconciliation was completed with the patient.   Scheduled Inpatient Medications:   . atorvastatin  80 mg Oral QHS  .  insulin aspart  0-5 Units Subcutaneous QHS  . insulin aspart  0-9 Units Subcutaneous TID WC  . [START ON 11/29/2015] pantoprazole (PROTONIX) IV  40 mg Intravenous Q12H  . sodium chloride flush  3 mL Intravenous Q12H  . vitamin B-12  1,000 mcg Oral Daily    Continuous Inpatient Infusions:   . dextrose 5 % and 0.9% NaCl    . pantoprozole (PROTONIX) infusion 8 mg/hr (11/25/15 1827)    PRN Inpatient Medications:  acetaminophen **OR** acetaminophen, ipratropium-albuterol, ondansetron **OR** ondansetron (ZOFRAN) IV  Family History: family history includes Esophageal cancer in her brother; Kidney cancer in her father.    Social History:   reports that she quit smoking about 10 years ago. Her smoking use included Cigarettes. She smoked 2.00 packs per day. She does not have any smokeless tobacco history on file. She reports that she does not drink alcohol or use illicit drugs.   Review of Systems: Constitutional: Weight is stable.  Eyes: No changes in vision. ENT: No oral lesions, sore throat.  GI: see HPI.  Heme/Lymph: No easy bruising.  CV: No chest pain.  GU: No hematuria.  Integumentary: No rashes.  Neuro: No headaches.  Psych: No depression/anxiety.  Endocrine: No heat/cold intolerance.  Allergic/Immunologic: No urticaria.  Resp: No cough, SOB.  Musculoskeletal: No joint swelling.    Physical Examination: BP 114/33 mmHg  Pulse 74  Temp(Src) 97.8 F (36.6 C) (Oral)  Resp 20  Ht '5\' 3"'  (1.6 m)  Wt 235 lb (106.595 kg)  BMI 41.64 kg/m2  SpO2 96% Gen: NAD, alert and oriented x 4. Able to accurately state the year but unable to provide details on recent illness.  HEENT: PEERLA, EOMI, Neck: supple, no JVD or thyromegaly Chest: CTA bilaterally, no wheezes, crackles, or other adventitious sounds CV: RRR, no m/g/c/r Abd: soft, NT, ND, +BS in all four quadrants; no HSM, guarding, ridigity, or rebound tenderness Rectal-dark brown stool in rectal vault. Grossly heme + Ext: no  edema, well perfused with 2+ pulses, Skin: no rash or lesions noted Lymph: no LAD  Data: Lab Results  Component Value Date   WBC 6.4 11/26/2015   HGB 8.4* 11/26/2015   HCT 24.9* 11/26/2015   MCV 86.4 11/26/2015   PLT 194 11/26/2015    Recent Labs Lab 11/25/15 1457 11/26/15 0508 11/26/15 1028  HGB 6.9* 8.2* 8.4*   Lab Results  Component Value Date   NA 144 11/26/2015   K 5.3* 11/26/2015   CL 112* 11/26/2015   CO2 27 11/26/2015   BUN 58* 11/26/2015   CREATININE 2.27* 11/26/2015  Lab Results  Component Value Date   ALT 20 11/25/2015   AST 24 11/25/2015   ALKPHOS 46 11/25/2015   BILITOT 0.7 11/25/2015    Recent Labs Lab 11/25/15 1457  INR 1.14   Elevated digoxin level at 2.6-->2.2    CT a/p: IMPRESSION: There is diverticulosis throughout the descending and sigmoid colon.On axial images 42 through 48, there appears to be a 2.6 cm rounded structure containing air and debris, arising off of the medial wall of a loop of left mid abdomen jejunum. There is a zone of mesenteric inflammation around this structure measuring 5 cm. There are several nonpathologic mesenteric lymph nodes in this region.  Assessment/Plan: Teresa Mercado is a 70 y.o. female admitted for nausea/vomiting, AMS found to have anemia and worsening renal function.   1. Anemia - baseline Hgb around 10-11 now down to 6.9 on admission. Normocytic. INR 1.14. Has improved after transfusion. Grossly occult blood positive on exam. Given her aspirin 325 mg use most likely PUD, erosive gastritis. Plan for upper endoscopy initially. If negative consider colonoscopy which she has not had in the past. Broad differential includes AVM, malignancy, PUD, etc. Continue PPI gtt.   2. ARF w/ CKD-improving w/ rehydration.   Recommendations:   1. EGD tomorrow.  2. NPO at midnight.  3. Continue PPI gtt 4. Hold ASA 317m  Case discussed w/ Dr. SGustavo Lah   Thank you for the consult. Please call with questions or  concerns.  JRonney Asters PA-C [facility]

## 2015-11-26 NOTE — Progress Notes (Signed)
Patient ID: Anjalina Lehto, female   DOB: Jan 07, 1946, 70 y.o.   MRN: [id number] [place] at [place] NAME: Teresa Mercado    MR#:  [id number]  DATE OF BIRTH:  [birth year]  SUBJECTIVE:  Patient presented with increasing shortness of breath found to be very weak had black tarry stools hemoglobin dropped to 6.9  REVIEW OF SYSTEMS:   Review of Systems  Constitutional: Negative for fever, chills and weight loss.  HENT: Negative for ear discharge, ear pain and nosebleeds.   Eyes: Negative for blurred vision, pain and discharge.  Respiratory: Positive for shortness of breath. Negative for sputum production, wheezing and stridor.   Cardiovascular: Positive for palpitations. Negative for chest pain, orthopnea and PND.  Gastrointestinal: Negative for nausea, vomiting, abdominal pain and diarrhea.  Genitourinary: Negative for urgency and frequency.  Musculoskeletal: Positive for back pain. Negative for joint pain.  Neurological: Positive for weakness. Negative for sensory change, speech change and focal weakness.  Psychiatric/Behavioral: Negative for depression and hallucinations. The patient is not nervous/anxious.   All other systems reviewed and are negative.  Tolerating Diet:npo Tolerating PT: pending  DRUG ALLERGIES:   Allergies  Allergen Reactions  . Quinapril Other (See Comments)    Reaction:  Elevated potassium   . Venlafaxine Other (See Comments)    Reaction:  GI upset   . Zetia [Ezetimibe] Nausea And Vomiting    VITALS:  Blood pressure 114/33, pulse 74, temperature 97.8 F (36.6 C), temperature source Oral, resp. rate 20, height 5\' 3"  (1.6 m), weight 106.595 kg (235 lb), SpO2 96 %.  PHYSICAL EXAMINATION:   Physical Exam  GENERAL:  70 y.o.-year-old patient lying in the bed with no acute distress.  EYES: Pupils equal, round, reactive to light and accommodation. No scleral icterus. Extraocular muscles intact. palllor HEENT: Head  atraumatic, normocephalic. Oropharynx and nasopharynx clear.  NECK:  Supple, no jugular venous distention. No thyroid enlargement, no tenderness.  LUNGS: distant breath sounds bilaterally, no wheezing, rales, rhonchi. No use of accessory muscles of respiration.  CARDIOVASCULAR: S1, S2 normal. No murmurs, rubs, or gallops.  ABDOMEN: Soft, nontender, nondistended. Bowel sounds present. No organomegaly or mass.  EXTREMITIES: No cyanosis, clubbing or edema b/l.    NEUROLOGIC: Cranial nerves II through XII are intact. No focal Motor or sensory deficits b/l.   PSYCHIATRIC:  patient is alert and oriented x 3.  SKIN: No obvious rash, lesion, or ulcer.   LABORATORY PANEL:  CBC  Recent Labs Lab 11/26/15 0508 11/26/15 1028  WBC 6.4  --   HGB 8.2* 8.4*  HCT 24.9*  --   PLT 194  --     Chemistries   Recent Labs Lab 11/25/15 1457 11/26/15 0508  NA 140 144  K 5.8* 5.3*  CL 105 112*  CO2 26 27  GLUCOSE 100* 53*  BUN 73* 58*  CREATININE 2.65* 2.27*  CALCIUM 8.9 8.3*  AST 24  --   ALT 20  --   ALKPHOS 46  --   BILITOT 0.7  --    Cardiac Enzymes  Recent Labs Lab 11/25/15 1457  TROPONINI <0.03   RADIOLOGY:  Ct Abdomen Pelvis Wo Contrast  11/25/2015  CLINICAL DATA:  Dark stools with worsening nausea and vomiting for 4 days, history of congestive heart failure and COPD EXAM: CT ABDOMEN AND PELVIS WITHOUT CONTRAST TECHNIQUE: Multidetector CT imaging of the abdomen and pelvis was performed following the standard protocol without IV contrast. COMPARISON:  None. FINDINGS: Lower  chest: Subsegmental atelectasis at both lung bases. Small focal pericardial effusion along the right heart border measuring up to 16 mm. Hepatobiliary: Normal non contrasted appearance Pancreas: Normal Spleen: Normal Adrenals/Urinary Tract: Adrenal glands are normal. Both kidneys are atrophic. Right kidney appears otherwise normal. Left kidney demonstrates a heavily calcified area in the lower pole cortex measuring 2  cm likely not of acute significance. Bladder normal. Stomach/Bowel: There is diverticulosis throughout the descending and sigmoid colon.On axial images 42 through 48, there appears to be a 2.6 cm rounded structure containing air and debris, arising off of the medial wall of a loop of left mid abdomen jejunum. There is a zone of mesenteric inflammation around this structure measuring 5 cm. There are several nonpathologic mesenteric lymph nodes in this region. Vascular/Lymphatic: Extensive atherosclerotic aortoiliac calcification. No significant abdominal or pelvic adenopathy. Reproductive: 2.5 cm calcified left uterine fibroid. Other: No ascites Musculoskeletal: No acute musculoskeletal findings IMPRESSION: 5 cm focus of inflammation in the small bowel mesentery of the left abdomen appears related to a 2.5 cm structure that likely represents an inflamed jejunal diverticulum, consistent with diverticuli. Electronically Signed   By: Skipper Cliche M.D.   On: 11/25/2015 18:45   Dg Chest 2 View  11/25/2015  CLINICAL DATA:  Short of breath for several days. EXAM: CHEST  2 VIEW COMPARISON:  10/30/2015 FINDINGS: Hyperexpansion is consistent with emphysema. The lungs are clear wiithout focal pneumonia, edema, pneumothorax or pleural effusion. Interstitial markings are diffusely coarsened with chronic features. The cardio pericardial silhouette is enlarged. The visualized bony structures of the thorax are intact. Left-sided permanent pacemaker remains in place. IMPRESSION: No active cardiopulmonary disease. Electronically Signed   By: Misty Stanley M.D.   On: 11/25/2015 17:55   ASSESSMENT AND PLAN:   Teresa Mercado is a 70 y.o. female with a known history of diastolic CHF, COPD, diabetes mellitus, CK D stage III with baseline creatinine around 1.6, complete heart block with a pacemaker presents to the hospital secondary to dark stools and worsening nausea and vomiting for 4 days now.  #1 Anemia- acute anemia- likely GI  source- melena with nausea, vomiting -Patient came in with 6.9-- transfused 2 units--8.3--- 8.4 - GI consulted -  on protonix drip  #2 ARF on CKD stage 3- ATN likely - Hold nephrotoxins, IV fluids, f/u on kidneys after CT abd - baseline cr around 1.6, now at 2.6 - monitor I&O  #3 Diastolic CHF- chronic- hold lasix Monitor on tele, f/u CXR  #4 Hyperkalemia- could be from digoxin toxicity in the setting of ARF - hold lisinopril, digoxin - calcium gluconate and insulin, dextrose ordered - hold kayexalate in the setting of GI bleed- recheck K+ again  #5 DM- hold levemir, metformin and Januvia as NPO On SSI  #6 DVT Prophylaxis- TEDs and SCDs  Case discussed with Care Management/Social Worker. Management plans discussed with the patient, family and they are in agreement.  CODE STATUS: full  DVT Prophylaxis: scd and teds  TOTAL TIME TAKING CARE OF THIS PATIENT: 30 minutes.  >50% time spent on counselling and coordination of care  POSSIBLE D/C IN 2-23 DAYS, DEPENDING ON CLINICAL CONDITION.  Note: This dictation was prepared with Dragon dictation along with smaller phrase technology. Any transcriptional errors that result from this process are unintentional.  Teresa Mercado M.D on 11/26/2015 at 2:22 PM  Between 7am to 6pm - Pager - [phone]  After 6pm go to [email or web address] - [facility]  Office  [phone]  CC: Primary care physician; Idelle Crouch, MD

## 2015-11-26 NOTE — Progress Notes (Signed)
Notified Dr. Posey Pronto that patient's CBG's are running low due to NPO status. MD stated to change fluids to D5 NS @75 . Will continue to monitor.

## 2015-11-27 ENCOUNTER — Encounter
Admission: EM | Disposition: A | Discharge status: Home Health | Payer: Self-pay | Source: Home / Self Care | Attending: Internal Medicine

## 2015-11-27 ENCOUNTER — Encounter: Payer: Self-pay | Admitting: *Deleted

## 2015-11-27 ENCOUNTER — Inpatient Hospital Stay: Payer: Medicare Other | Admitting: Anesthesiology

## 2015-11-27 HISTORY — PX: ESOPHAGOGASTRODUODENOSCOPY: SHX5428

## 2015-11-27 LAB — GLUCOSE, CAPILLARY
GLUCOSE-CAPILLARY: 185 mg/dL — AB (ref 65–99)
GLUCOSE-CAPILLARY: 158 mg/dL — AB (ref 65–99)
Glucose-Capillary: 179 mg/dL — ABNORMAL HIGH (ref 65–99)
Glucose-Capillary: 219 mg/dL — ABNORMAL HIGH (ref 65–99)
Glucose-Capillary: 247 mg/dL — ABNORMAL HIGH (ref 65–99)

## 2015-11-27 LAB — CREATININE, SERUM
CREATININE: 1.84 mg/dL — AB (ref 0.44–1.00)
GFR calc Af Amer: 31 mL/min — ABNORMAL LOW (ref >60)
GFR, EST NON AFRICAN AMERICAN: 27 mL/min — AB (ref >60)

## 2015-11-27 SURGERY — ESOPHAGOGASTRODUODENOSCOPY (EGD) WITH PROPOFOL
Anesthesia: General

## 2015-11-27 SURGERY — EGD (ESOPHAGOGASTRODUODENOSCOPY)
Anesthesia: General

## 2015-11-27 MED ORDER — SODIUM CHLORIDE 0.9 % IV SOLN: INTRAVENOUS | Status: DC

## 2015-11-27 MED ORDER — LIDOCAINE HCL (PF) 2 % IJ SOLN
INTRAMUSCULAR | Status: DC | PRN
  Administered 2015-11-27: 60 mg

## 2015-11-27 MED ORDER — PROPOFOL 500 MG/50ML IV EMUL
INTRAVENOUS | Status: DC | PRN
  Administered 2015-11-27: 50 ug/kg/min via INTRAVENOUS

## 2015-11-27 MED ORDER — MIDAZOLAM HCL 5 MG/5ML IJ SOLN
INTRAMUSCULAR | Status: DC | PRN
  Administered 2015-11-27: 1 mg via INTRAVENOUS

## 2015-11-27 MED ORDER — SODIUM CHLORIDE 0.9 % IV SOLN
INTRAVENOUS | Status: DC
  Administered 2015-11-27 (×2): via INTRAVENOUS

## 2015-11-27 MED ORDER — PROPOFOL 10 MG/ML IV BOLUS
INTRAVENOUS | Status: DC | PRN
  Administered 2015-11-27: 30 mg via INTRAVENOUS
  Administered 2015-11-27 (×2): 10 mg via INTRAVENOUS

## 2015-11-27 MED ORDER — PHENYLEPHRINE HCL 10 MG/ML IJ SOLN
INTRAMUSCULAR | Status: DC | PRN
  Administered 2015-11-27 (×2): 300 ug via INTRAVENOUS
  Administered 2015-11-27 (×2): 200 ug via INTRAVENOUS
  Administered 2015-11-27: 100 ug via INTRAVENOUS
  Administered 2015-11-27: 200 ug via INTRAVENOUS
  Administered 2015-11-27: 300 ug via INTRAVENOUS
  Administered 2015-11-27: 200 ug via INTRAVENOUS

## 2015-11-27 MED ORDER — FENTANYL CITRATE (PF) 100 MCG/2ML IJ SOLN
INTRAMUSCULAR | Status: DC | PRN
  Administered 2015-11-27: 50 ug via INTRAVENOUS

## 2015-11-27 MED ORDER — IPRATROPIUM-ALBUTEROL 0.5-2.5 (3) MG/3ML IN SOLN
RESPIRATORY_TRACT | Status: AC
  Administered 2015-11-27: 3 mL via RESPIRATORY_TRACT
  Filled 2015-11-27: qty 3

## 2015-11-27 MED ORDER — IPRATROPIUM-ALBUTEROL 0.5-2.5 (3) MG/3ML IN SOLN: 3.0000 mL | Freq: Once | RESPIRATORY_TRACT | Status: DC

## 2015-11-27 MED ORDER — SODIUM CHLORIDE 0.9 % IV SOLN
INTRAVENOUS | Status: DC
  Administered 2015-11-27: 15:00:00 via INTRAVENOUS

## 2015-11-27 NOTE — Progress Notes (Signed)
Initial Nutrition Assessment    INTERVENTION:   Meals/Snacks: await diet progression post procedure  NUTRITION DIAGNOSIS:   Inadequate oral intake related to acute illness as evidenced by NPO status.  GOAL:   Patient will meet greater than or equal to 90% of their needs  MONITOR:   Diet advancement, PO intake, Labs, Weight trends, Other (Comment) (Digestigve System)  REASON FOR ASSESSMENT:   Malnutrition Screening Tool    ASSESSMENT:    Pt admitted with acute anemia with black tarry stools, down for EGD on visit today, acute on CKD III  Past Medical History  Diagnosis Date  . COPD (chronic obstructive pulmonary disease) (HCC)     Not on home o2  . CHF (congestive heart failure) (HCC)     Diastolic, last ECHO from Jan 2017- normal EF 55%  . Diabetes mellitus without complication ([place])     On Levemir  . Renal disorder     CKD stage 3  . Syncope     s/p pacemaker    Diet Order:  Diet NPO time specified   Energy Intake: NPO day 3   Recent Labs Lab 11/25/15 1457 11/26/15 0508 11/27/15 1010  NA 140 144  --   K 5.8* 5.3*  --   CL 105 112*  --   CO2 26 27  --   BUN 73* 58*  --   CREATININE 2.65* 2.27* 1.84*  CALCIUM 8.9 8.3*  --   GLUCOSE 100* 53*  --     Glucose Profile:   Recent Labs  11/27/15 0554 11/27/15 0802 11/27/15 1110  GLUCAP 179* 185* 158*   Nutritional Anemia Profile:  CBC Latest Ref Rng 11/26/2015 11/26/2015 11/26/2015  WBC 3.6 - 11.0 K/uL - - 6.4  Hemoglobin 12.0 - 16.0 g/dL 8.9(L) 8.4(L) 8.2(L)  Hematocrit 35.0 - 47.0 % - - 24.9(L)  Platelets 150 - 440 K/uL - - 194    Meds: D5-NS at 40 ml/hr  Skin:  Reviewed, no issues  Height:   Ht Readings from Last 1 Encounters:  11/27/15 5\' 3"  (1.6 m)    Weight:   Wt Readings from Last 1 Encounters:  11/27/15 235 lb (106.595 kg)    BMI:  Body mass index is 41.64 kg/(m^2).  LOW Care Level  Kerman Passey MS, [place], LDN [phone] Pager  [phone] Weekend/On-Call Pager

## 2015-11-27 NOTE — Progress Notes (Signed)
[facility]  Patient Status: Active  [facility] is providing the following services: SN/PT/OT  If patient discharges after hours, please call [phone].   Teresa Mercado 11/27/2015, 9:30 AM

## 2015-11-27 NOTE — Consult Note (Signed)
Subjective: Patient seen for anemia and reported black stools. No nausea vomiting or abdominal pain overnight. No bowel movement overnight. Hemoglobin stable on testing. Hemodynamically stable.  Objective: Vital signs in last 24 hours: Temp:  [98 F (36.7 C)-98.2 F (36.8 C)] 98 F (36.7 C) (03/10 1139) Pulse Rate:  [81-112] 88 (03/10 1414) Resp:  [18-20] 18 (03/10 1139) BP: (111-148)/(27-41) 124/27 mmHg (03/10 1414) SpO2:  [89 %-98 %] 89 % (03/10 1201) Blood pressure 124/27, pulse 88, temperature 98 F (36.7 C), temperature source Oral, resp. rate 18, height 5\' 3"  (1.6 m), weight 106.595 kg (235 lb), SpO2 89 %.   Intake/Output from previous day: 03/09 0701 - 03/10 0700 In: 1136.3 [I.V.:1136.3] Out: 1725 [Urine:1725]  Intake/Output this shift: Total I/O In: -  Out: 300 [Urine:300]   General appearance:  Elderly 70 year old female no distress Resp:  Coarse wheezes bilaterally Cardio:  Regular rate and rhythm GI:  Soft nontender nondistended bowel sounds positive normoactive, obese Extremities:  Clubbing or cyanosis   Lab Results: Results for orders placed or performed during the hospital encounter of 11/25/15 (from the past 24 hour(s))  Glucose, capillary     Status: Abnormal   Collection Time: 11/26/15  4:28 PM  Result Value Ref Range   Glucose-Capillary 125 (H) 65 - 99 mg/dL   Comment 1 Notify RN   Hemoglobin     Status: Abnormal   Collection Time: 11/26/15  7:24 PM  Result Value Ref Range   Hemoglobin 8.9 (L) 12.0 - 16.0 g/dL  Glucose, capillary     Status: Abnormal   Collection Time: 11/26/15  9:32 PM  Result Value Ref Range   Glucose-Capillary 129 (H) 65 - 99 mg/dL  Glucose, capillary     Status: Abnormal   Collection Time: 11/27/15  5:54 AM  Result Value Ref Range   Glucose-Capillary 179 (H) 65 - 99 mg/dL  Glucose, capillary     Status: Abnormal   Collection Time: 11/27/15  8:02 AM  Result Value Ref Range   Glucose-Capillary 185 (H) 65 - 99 mg/dL   Creatinine, serum     Status: Abnormal   Collection Time: 11/27/15 10:10 AM  Result Value Ref Range   Creatinine, Ser 1.84 (H) 0.44 - 1.00 mg/dL   GFR calc non Af Amer 27 (L) >60 mL/min   GFR calc Af Amer 31 (L) >60 mL/min  Glucose, capillary     Status: Abnormal   Collection Time: 11/27/15 11:10 AM  Result Value Ref Range   Glucose-Capillary 158 (H) 65 - 99 mg/dL      Recent Labs  11/25/15 1457 11/26/15 0508 11/26/15 1028 11/26/15 1924  WBC 7.8 6.4  --   --   HGB 6.9* 8.2* 8.4* 8.9*  HCT 21.2* 24.9*  --   --   PLT 232 194  --   --    BMET  Recent Labs  11/25/15 1457 11/26/15 0508 11/27/15 1010  NA 140 144  --   K 5.8* 5.3*  --   CL 105 112*  --   CO2 26 27  --   GLUCOSE 100* 53*  --   BUN 73* 58*  --   CREATININE 2.65* 2.27* 1.84*  CALCIUM 8.9 8.3*  --    LFT  Recent Labs  11/25/15 1457  PROT 6.5  ALBUMIN 2.7*  AST 24  ALT 20  ALKPHOS 46  BILITOT 0.7   PT/INR  Recent Labs  11/25/15 1457  LABPROT 14.8  INR 1.14   Hepatitis  Panel No results for input(s): HEPBSAG, HCVAB, HEPAIGM, HEPBIGM in the last 72 hours. C-Diff No results for input(s): CDIFFTOX in the last 72 hours. No results for input(s): CDIFFPCR in the last 72 hours.   Studies/Results: Ct Abdomen Pelvis Wo Contrast  11/25/2015  CLINICAL DATA:  Dark stools with worsening nausea and vomiting for 4 days, history of congestive heart failure and COPD EXAM: CT ABDOMEN AND PELVIS WITHOUT CONTRAST TECHNIQUE: Multidetector CT imaging of the abdomen and pelvis was performed following the standard protocol without IV contrast. COMPARISON:  None. FINDINGS: Lower chest: Subsegmental atelectasis at both lung bases. Small focal pericardial effusion along the right heart border measuring up to 16 mm. Hepatobiliary: Normal non contrasted appearance Pancreas: Normal Spleen: Normal Adrenals/Urinary Tract: Adrenal glands are normal. Both kidneys are atrophic. Right kidney appears otherwise normal. Left kidney  demonstrates a heavily calcified area in the lower pole cortex measuring 2 cm likely not of acute significance. Bladder normal. Stomach/Bowel: There is diverticulosis throughout the descending and sigmoid colon.On axial images 42 through 48, there appears to be a 2.6 cm rounded structure containing air and debris, arising off of the medial wall of a loop of left mid abdomen jejunum. There is a zone of mesenteric inflammation around this structure measuring 5 cm. There are several nonpathologic mesenteric lymph nodes in this region. Vascular/Lymphatic: Extensive atherosclerotic aortoiliac calcification. No significant abdominal or pelvic adenopathy. Reproductive: 2.5 cm calcified left uterine fibroid. Other: No ascites Musculoskeletal: No acute musculoskeletal findings IMPRESSION: 5 cm focus of inflammation in the small bowel mesentery of the left abdomen appears related to a 2.5 cm structure that likely represents an inflamed jejunal diverticulum, consistent with diverticuli. Electronically Signed   By: Skipper Cliche M.D.   On: 11/25/2015 18:45   Dg Chest 2 View  11/25/2015  CLINICAL DATA:  Short of breath for several days. EXAM: CHEST  2 VIEW COMPARISON:  10/30/2015 FINDINGS: Hyperexpansion is consistent with emphysema. The lungs are clear wiithout focal pneumonia, edema, pneumothorax or pleural effusion. Interstitial markings are diffusely coarsened with chronic features. The cardio pericardial silhouette is enlarged. The visualized bony structures of the thorax are intact. Left-sided permanent pacemaker remains in place. IMPRESSION: No active cardiopulmonary disease. Electronically Signed   By: Misty Stanley M.D.   On: 11/25/2015 17:55    Scheduled Inpatient Medications:   . atorvastatin  80 mg Oral QHS  . insulin aspart  0-5 Units Subcutaneous QHS  . insulin aspart  0-9 Units Subcutaneous TID WC  . [START ON 11/29/2015] pantoprazole (PROTONIX) IV  40 mg Intravenous Q12H  . sodium chloride flush  3 mL  Intravenous Q12H  . vitamin B-12  1,000 mcg Oral Daily    Continuous Inpatient Infusions:   . dextrose 5 % and 0.9% NaCl 40 mL/hr at 11/27/15 0923  . pantoprozole (PROTONIX) infusion 8 mg/hr (11/27/15 0226)    PRN Inpatient Medications:  acetaminophen **OR** acetaminophen, ipratropium-albuterol, ondansetron **OR** ondansetron (ZOFRAN) IV  Miscellaneous:   Assessment:  1. Anemia, black stools, recent nausea and vomiting.  Plan:  1. EGD with indicated procedures. I have discussed the risks benefits and complications of procedures to include not limited to bleeding, infection, perforation and the risk of sedation and the patient wishes to proceed.  Lollie Sails MD 11/27/2015, 2:20 PM

## 2015-11-27 NOTE — Consult Note (Addendum)
Please see full EGD report. There was no evidence of an active bleeding lesion or lesion with stigmata of recent bleeding. There was some mild duodenitis and some mild prepyloric gastritis. It is possible that she is Helicobacter pylori positive and biopsies have been taken for that. I would recommend further evaluation which could be done as an outpatient to include small bowel series, by mouth capsule endoscopy, and colonoscopy. Small bowel series could possibly be done before discharge which would assist in the outpatient evaluation. The last Dr. Rayann Heman to see tomorrow if patient is still in house. Continue bid ppi until seen in GI fu.Case discussed with Dr Fritzi Mandes.

## 2015-11-27 NOTE — Op Note (Signed)
[facility] Patient Name: Teresa Mercado Procedure Date: 11/27/2015 2:23 PM MRN: [id number] Account #: [id number] Date of Birth: 12/01/1945 Admit Type: Inpatient Age: 70 Room: [facility] ENDO ROOM 3 Gender: Female Note Status: Finalized Procedure:            Upper GI endoscopy Indications:          Acute post hemorrhagic anemia, Nausea with vomiting Providers:            Lollie Sails, MD Referring MD:         Leonie Douglas. Doy Hutching, MD (Referring MD) Medicines:            Monitored Anesthesia Care Complications:        No immediate complications. Procedure:            Pre-Anesthesia Assessment:                       - ASA Grade Assessment: III - A patient with severe                        systemic disease.                       After obtaining informed consent, the endoscope was                        passed under direct vision. Throughout the procedure,                        the patient's blood pressure, pulse, and oxygen                        saturations were monitored continuously. The Endoscope                        was introduced through the mouth, and advanced to the                        third part of duodenum. The upper GI endoscopy was                        accomplished without difficulty. The patient tolerated                        the procedure well. Findings:      The Z-line was variable. Biopsies were taken with a cold forceps for       histology.      The exam of the esophagus was otherwise normal.      Patchy mild inflammation characterized by congestion (edema) and       erythema was found in the prepyloric region of the stomach. Biopsies       were taken with a cold forceps for histology. Biopsies were taken with a       cold forceps for Helicobacter pylori testing.      Patchy mild inflammation characterized by congestion (edema), erythema       and granularity was found in the duodenal bulb and in the second portion       of  the duodenum.      There is some nodular appearing mucosa in the anterior duodenal bulb.       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 Impression:           - Z-line variable. Biopsied.                       - Gastritis. Biopsied.                       - Duodenitis. Recommendation:       - Await pathology results.                       - Use Protonix (pantoprazole) 40 mg PO BID daily.                       - consider colonoscopy, small bowel series and video                        capsule endoscopy Procedure Code(s):    --- Professional ---                       [phone], Esophagogastroduodenoscopy, flexible, transoral;                        with biopsy, single or multiple Diagnosis Code(s):    --- Professional ---                       K22.8, Other specified diseases of esophagus                       K29.70, Gastritis, unspecified, without bleeding                       K29.80, Duodenitis without bleeding                       D62, Acute posthemorrhagic anemia                       R11.2, Nausea with vomiting, unspecified CPT copyright 2016 American Medical Association. All rights reserved. The codes documented in this report are preliminary and upon coder review may  be revised to meet current compliance requirements. Lollie Sails, MD 11/27/2015 4:42:06 PM This report has been signed electronically. Number of Addenda: 0 Note Initiated On: 11/27/2015 2:23 PM      [facility]

## 2015-11-27 NOTE — Care Management Important Message (Signed)
Important Message  Patient Details  Name: Teresa Mercado MRN: [id number] Date of Birth: 29-Oct-1945   Medicare Important Message Given:  Yes    [name] 11/27/2015, 9:31 AM

## 2015-11-27 NOTE — Progress Notes (Signed)
Patients last CBG was >200. Prime doc paged to see if IVF can be changed from D5% to NS. Dr. Vianne Bulls gave orders to change IVF to NS @60ml /hr.

## 2015-11-27 NOTE — Care Management (Signed)
Patient is admitted from home.  EGD today.  Found that patient is currently open to [place] for SN PT OT.  PT consult pending.

## 2015-11-27 NOTE — Anesthesia Preprocedure Evaluation (Addendum)
Anesthesia Evaluation  Patient identified by MRN, date of birth, ID band Patient awake    Reviewed: Allergy & Precautions, H&P , NPO status , Patient's Chart, lab work & pertinent test results  History of Anesthesia Complications Negative for: history of anesthetic complications  Airway Mallampati: III  TM Distance: >3 FB Neck ROM: full    Dental  (+) Poor Dentition, Chipped, Missing   Pulmonary shortness of breath and with exertion, COPD, former smoker,    Pulmonary exam normal breath sounds clear to auscultation       Cardiovascular Exercise Tolerance: Poor (-) angina+CHF and + DOE  (-) Past MI Normal cardiovascular exam+ pacemaker  Rhythm:regular Rate:Normal     Neuro/Psych negative neurological ROS  negative psych ROS   GI/Hepatic negative GI ROS, Neg liver ROS,   Endo/Other  negative endocrine ROSdiabetes, Type 2  Renal/GU Renal disease  negative genitourinary   Musculoskeletal   Abdominal   Peds  Hematology negative hematology ROS (+) anemia ,   Anesthesia Other Findings Past Medical History:   COPD (chronic obstructive pulmonary disease) (*                Comment:Not on home o2   CHF (congestive heart failure) ([place])                           Comment:Diastolic, last ECHO from Jan 2017- normal EF               55%   Diabetes mellitus without complication ([place])                   Comment:On Levemir   Renal disorder                                                 Comment:CKD stage 3   Syncope                                                        Comment:s/p pacemaker  Past Surgical History:   PACEMAKER INSERTION                                           APPENDECTOMY                                                  TONSILLECTOMY                                                 TUBAL LIGATION  BMI    Body Mass Index   41.63 kg/m 2       Reproductive/Obstetrics negative OB ROS                            Anesthesia Physical Anesthesia Plan  ASA: III  Anesthesia Plan: General   Post-op Pain Management:    Induction:   Airway Management Planned:   Additional Equipment:   Intra-op Plan:   Post-operative Plan:   Informed Consent: I have reviewed the patients History and Physical, chart, labs and discussed the procedure including the risks, benefits and alternatives for the proposed anesthesia with the patient or authorized representative who has indicated his/her understanding and acceptance.   Dental Advisory Given  Plan Discussed with: Anesthesiologist, CRNA and Surgeon  Anesthesia Plan Comments:         Anesthesia Quick Evaluation

## 2015-11-27 NOTE — Evaluation (Signed)
Physical Therapy Evaluation Patient Details Name: Teresa Mercado MRN: [id number] DOB: 08/12/1946 Today's Date: 11/27/2015   History of Present Illness  Pt is a 70 y.o. female with PMH of CHF, COPD, diabetes, CKD and pacemaker.  Pt presented with indigestion, nausea, vomitting, altered mental status, dark stools and weakness.  Pt admitted for upper GI bleed.  Pt with recent discharge outside hospital two to three weeks ago.    Clinical Impression  Prior to admission pt was independent.  Pt lives with husband.  Pt was CGA for bed mobility, sit to stand with RW and ambulation with RW.  Pt required intermittent VC's for RW placement during ambulation.  Pt's vitals were [facility] during session.  Due to aforementioned function and strength deficits, pt is in need of skilled physical therapy.  It is recommended that pt is discharged to home with familial support and home health PT when medically appropriate.     Follow Up Recommendations Home health PT;Supervision/Assistance - 24 hour    Equipment Recommendations  Rolling walker with 5" wheels    Recommendations for Other Services       Precautions / Restrictions Precautions Precautions: Fall Restrictions Weight Bearing Restrictions: No      Mobility  Bed Mobility Overal bed mobility: Needs Assistance Bed Mobility: Rolling;Sidelying to Sit;Sit to Sidelying Rolling: Min guard Sidelying to sit: Min guard     Sit to sidelying: Min guard    Transfers Overall transfer level: Needs assistance Equipment used: Rolling walker (2 wheeled) Transfers: Sit to/from Stand Sit to Stand: Min guard            Ambulation/Gait Ambulation/Gait assistance: Min guard Ambulation Distance (Feet): 15 Feet Assistive device: Rolling walker (2 wheeled) Gait Pattern/deviations: Decreased step length - left;Decreased step length - right Gait velocity: decreased   General Gait Details: intermittent VC's for walker placement   Stairs             Wheelchair Mobility    Modified Rankin (Stroke Patients Only)       Balance Overall balance assessment: Needs assistance Sitting-balance support: Feet supported Sitting balance-Leahy Scale: Fair     Standing balance support: Bilateral upper extremity supported (RW) Standing balance-Leahy Scale: Fair                               Pertinent Vitals/Pain Pain Assessment: No/denies pain    Home Living Family/patient expects to be discharged to:: Private residence   Available Help at Discharge: Family Type of Home: House Home Access: Stairs to enter Entrance Stairs-Rails: None Technical brewer of Steps: 1 Home Layout: One level Home Equipment: None      Prior Function Level of Independence: Independent               Hand Dominance        Extremity/Trunk Assessment   Upper Extremity Assessment: Overall WFL for tasks assessed           Lower Extremity Assessment: Generalized weakness      Cervical / Trunk Assessment: Normal  Communication   Communication: No difficulties  Cognition Arousal/Alertness: Awake/alert Behavior During Therapy: WFL for tasks assessed/performed Overall Cognitive Status: No family/caregiver present to determine baseline cognitive functioning       Memory: Decreased recall of precautions;Decreased short-term memory.  Oriented to person, not oriented to place, time or reason admitted.              General Comments  Nursing was contacted and cleared pt for physical therapy.  Pt was agreeable and tolerated session well.  Dr Posey Pronto was contacted and MD gave verbal confirmation to see pt due to previous hyperkalemia concerns.     Exercises        Assessment/Plan    PT Assessment Patient needs continued PT services  PT Diagnosis Generalized weakness   PT Problem List Decreased strength;Decreased activity tolerance;Decreased balance;Decreased mobility;Decreased knowledge of use of DME  PT Treatment  Interventions DME instruction;Gait training;Stair training;Functional mobility training;Therapeutic activities;Therapeutic exercise;Balance training;Patient/family education   PT Goals (Current goals can be found in the Care Plan section) Acute Rehab PT Goals Patient Stated Goal: to go home  PT Goal Formulation: With patient Time For Goal Achievement: 12/11/15 Potential to Achieve Goals: Fair    Frequency Min 2X/week   Barriers to discharge        Co-evaluation               End of Session Equipment Utilized During Treatment: Gait belt;Oxygen (2 L/min via nasal cannula) Activity Tolerance: Patient tolerated treatment well Patient left: with bed alarm set;in bed;with call bell/phone within reach;with SCD's reapplied Nurse Communication: Mobility status         Time: [id number] PT Time Calculation (min) (ACUTE ONLY): 34 min   Charges:         PT G Codes:       Mittie Bodo, SPT Mittie Bodo 11/27/2015, 12:49 PM

## 2015-11-27 NOTE — Care Management (Signed)
Patient off floor in endoscopy.  PT consult pending.

## 2015-11-27 NOTE — Anesthesia Postprocedure Evaluation (Signed)
Anesthesia Post Note  Patient: Teresa Mercado  Procedure(s) Performed: Procedure(s) (LRB): ESOPHAGOGASTRODUODENOSCOPY (EGD) (N/A)  Patient location during evaluation: PACU Anesthesia Type: General Level of consciousness: awake Pain management: pain level controlled Vital Signs Assessment: post-procedure vital signs reviewed and stable Respiratory status: spontaneous breathing Cardiovascular status: blood pressure returned to baseline Anesthetic complications: no    Last Vitals:  Filed Vitals:   11/27/15 1639 11/27/15 1649  BP: 107/41 91/41  Pulse: 90 105  Temp: 36.5 C   Resp: 37 27    Last Pain:  Filed Vitals:   11/27/15 1652  PainSc: 0-No pain                 VAN STAVEREN,Cora Brierley

## 2015-11-27 NOTE — Transfer of Care (Signed)
Immediate Anesthesia Transfer of Care Note  Patient: Teresa Mercado  Procedure(s) Performed: Procedure(s): ESOPHAGOGASTRODUODENOSCOPY (EGD) (N/A)  Patient Location: PACU  Anesthesia Type:General  Level of Consciousness: sedated  Airway & Oxygen Therapy: Patient Spontanous Breathing and Patient connected to nasal cannula oxygen  Post-op Assessment: Report given to RN and Post -op Vital signs reviewed and stable  Post vital signs: Reviewed and stable  Last Vitals:  Filed Vitals:   11/27/15 1426 11/27/15 1639  BP: 130/32 96/45  Pulse: 93 90  Temp: 37 C 36.5 C  Resp: 16 37    Complications: No apparent anesthesia complications

## 2015-11-27 NOTE — Progress Notes (Signed)
Patient ID: Teresa Mercado, female   DOB: 08/11/1946, 70 y.o.   MRN: [id number] [facility] NAME: Teresa Mercado    MR#:  [id number]  DATE OF BIRTH:  1946-01-23  SUBJECTIVE:  Patient presented with increasing shortness of breath found to be very weak had black tarry stools hemoglobin dropped to 6.9 No complaints REVIEW OF SYSTEMS:   Review of Systems  Constitutional: Negative for fever, chills and weight loss.  HENT: Negative for ear discharge, ear pain and nosebleeds.   Eyes: Negative for blurred vision, pain and discharge.  Respiratory: Positive for shortness of breath. Negative for sputum production, wheezing and stridor.   Cardiovascular: Positive for palpitations. Negative for chest pain, orthopnea and PND.  Gastrointestinal: Negative for nausea, vomiting, abdominal pain and diarrhea.  Genitourinary: Negative for urgency and frequency.  Musculoskeletal: Positive for back pain. Negative for joint pain.  Neurological: Positive for weakness. Negative for sensory change, speech change and focal weakness.  Psychiatric/Behavioral: Negative for depression and hallucinations. The patient is not nervous/anxious.   All other systems reviewed and are negative.  Tolerating Diet:npo Tolerating PT: pending  DRUG ALLERGIES:   Allergies  Allergen Reactions  . Quinapril Other (See Comments)    Reaction:  Elevated potassium   . Venlafaxine Other (See Comments)    Reaction:  GI upset   . Zetia [Ezetimibe] Nausea And Vomiting    VITALS:  Blood pressure 111/35, pulse 112, temperature 98 F (36.7 C), temperature source Oral, resp. rate 18, height 5\' 3"  (1.6 m), weight 106.595 kg (235 lb), SpO2 89 %.  PHYSICAL EXAMINATION:   Physical Exam  GENERAL:  70 y.o.-year-old patient lying in the bed with no acute distress.  EYES: Pupils equal, round, reactive to light and accommodation. No scleral icterus. Extraocular muscles intact.  palllor HEENT: Head atraumatic, normocephalic. Oropharynx and nasopharynx clear.  NECK:  Supple, no jugular venous distention. No thyroid enlargement, no tenderness.  LUNGS: distant breath sounds bilaterally, no wheezing, rales, rhonchi. No use of accessory muscles of respiration.  CARDIOVASCULAR: S1, S2 normal. No murmurs, rubs, or gallops.  ABDOMEN: Soft, nontender, nondistended. Bowel sounds present. No organomegaly or mass.  EXTREMITIES: No cyanosis, clubbing or edema b/l.    NEUROLOGIC: Cranial nerves II through XII are intact. No focal Motor or sensory deficits b/l.   PSYCHIATRIC:  patient is alert and oriented x 3.  SKIN: No obvious rash, lesion, or ulcer.   LABORATORY PANEL:  CBC  Recent Labs Lab 11/26/15 0508  11/26/15 1924  WBC 6.4  --   --   HGB 8.2*  < > 8.9*  HCT 24.9*  --   --   PLT 194  --   --   < > = values in this interval not displayed.  Chemistries   Recent Labs Lab 11/25/15 1457 11/26/15 0508 11/27/15 1010  NA 140 144  --   K 5.8* 5.3*  --   CL 105 112*  --   CO2 26 27  --   GLUCOSE 100* 53*  --   BUN 73* 58*  --   CREATININE 2.65* 2.27* 1.84*  CALCIUM 8.9 8.3*  --   AST 24  --   --   ALT 20  --   --   ALKPHOS 46  --   --   BILITOT 0.7  --   --    Cardiac Enzymes  Recent Labs Lab 11/25/15 1457  TROPONINI <0.03   RADIOLOGY:  Ct Abdomen Pelvis Wo Contrast  11/25/2015  CLINICAL DATA:  Dark stools with worsening nausea and vomiting for 4 days, history of congestive heart failure and COPD EXAM: CT ABDOMEN AND PELVIS WITHOUT CONTRAST TECHNIQUE: Multidetector CT imaging of the abdomen and pelvis was performed following the standard protocol without IV contrast. COMPARISON:  None. FINDINGS: Lower chest: Subsegmental atelectasis at both lung bases. Small focal pericardial effusion along the right heart border measuring up to 16 mm. Hepatobiliary: Normal non contrasted appearance Pancreas: Normal Spleen: Normal Adrenals/Urinary Tract: Adrenal glands are  normal. Both kidneys are atrophic. Right kidney appears otherwise normal. Left kidney demonstrates a heavily calcified area in the lower pole cortex measuring 2 cm likely not of acute significance. Bladder normal. Stomach/Bowel: There is diverticulosis throughout the descending and sigmoid colon.On axial images 42 through 48, there appears to be a 2.6 cm rounded structure containing air and debris, arising off of the medial wall of a loop of left mid abdomen jejunum. There is a zone of mesenteric inflammation around this structure measuring 5 cm. There are several nonpathologic mesenteric lymph nodes in this region. Vascular/Lymphatic: Extensive atherosclerotic aortoiliac calcification. No significant abdominal or pelvic adenopathy. Reproductive: 2.5 cm calcified left uterine fibroid. Other: No ascites Musculoskeletal: No acute musculoskeletal findings IMPRESSION: 5 cm focus of inflammation in the small bowel mesentery of the left abdomen appears related to a 2.5 cm structure that likely represents an inflamed jejunal diverticulum, consistent with diverticuli. Electronically Signed   By: Skipper Cliche M.D.   On: 11/25/2015 18:45   Dg Chest 2 View  11/25/2015  CLINICAL DATA:  Short of breath for several days. EXAM: CHEST  2 VIEW COMPARISON:  10/30/2015 FINDINGS: Hyperexpansion is consistent with emphysema. The lungs are clear wiithout focal pneumonia, edema, pneumothorax or pleural effusion. Interstitial markings are diffusely coarsened with chronic features. The cardio pericardial silhouette is enlarged. The visualized bony structures of the thorax are intact. Left-sided permanent pacemaker remains in place. IMPRESSION: No active cardiopulmonary disease. Electronically Signed   By: Misty Stanley M.D.   On: 11/25/2015 17:55   ASSESSMENT AND PLAN:   Teresa Mercado is a 70 y.o. female with a known history of diastolic CHF, COPD, diabetes mellitus, CK D stage III with baseline creatinine around 1.6, complete heart  block with a pacemaker presents to the hospital secondary to dark stools and worsening nausea and vomiting for 4 days now.  #1 Anemia- acute anemia- likely GI source- melena with nausea, vomiting -Patient came in with 6.9-- transfused 2 units--8.3--- 8.4--8.9 - GI consulted---EGD today -  on protonix drip  #2 ARF on CKD stage 3- ATN likely - Hold nephrotoxins, IV fluids, f/u on kidneys after CT abd - baseline cr around 1.6, now at 0000000  #3 Diastolic CHF- chronic- hold lasix stable  #4 Hyperkalemia- could be from digoxin toxicity in the setting of ARF - hold lisinopril, digoxin - calcium gluconate and insulin, dextrose ordered - hold kayexalate in the setting of GI bleed- recheck K+ again  #5 DM- hold levemir, metformin and Januvia as NPO On SSI  #6 DVT Prophylaxis- TEDs and SCDs  Case discussed with Care Management/Social Worker. Management plans discussed with the patient, family and they are in agreement.  CODE STATUS: full  DVT Prophylaxis: scd and teds  TOTAL TIME TAKING CARE OF THIS PATIENT: 30 minutes.  >50% time spent on counselling and coordination of care  POSSIBLE D/C IN 2-23 DAYS, DEPENDING ON CLINICAL CONDITION.  Note: This dictation was prepared with Viviann Spare  dictation along with smaller phrase technology. Any transcriptional errors that result from this process are unintentional.  Davison Ohms M.D on 11/27/2015 at 2:05 PM  Between 7am to 6pm - Pager - [phone]  After 6pm go to [email or web address] - password [email or web address]  Office  [phone]  CC: Primary care physician; Idelle Crouch, MD

## 2015-11-28 ENCOUNTER — Inpatient Hospital Stay: Payer: Medicare Other

## 2015-11-28 ENCOUNTER — Encounter: Payer: Self-pay | Admitting: Gastroenterology

## 2015-11-28 LAB — BASIC METABOLIC PANEL
ANION GAP: 6 (ref 5–15)
BUN: 25 mg/dL — AB (ref 6–20)
CALCIUM: 7.7 mg/dL — AB (ref 8.9–10.3)
CO2: 25 mmol/L (ref 22–32)
Chloride: 108 mmol/L (ref 101–111)
Creatinine, Ser: 1.63 mg/dL — ABNORMAL HIGH (ref 0.44–1.00)
GFR calc Af Amer: 36 mL/min — ABNORMAL LOW (ref >60)
GFR calc non Af Amer: 31 mL/min — ABNORMAL LOW (ref >60)
GLUCOSE: 214 mg/dL — AB (ref 65–99)
Potassium: 4.4 mmol/L (ref 3.5–5.1)
Sodium: 139 mmol/L (ref 135–145)

## 2015-11-28 LAB — GLUCOSE, CAPILLARY
GLUCOSE-CAPILLARY: 157 mg/dL — AB (ref 65–99)
GLUCOSE-CAPILLARY: 212 mg/dL — AB (ref 65–99)
Glucose-Capillary: 199 mg/dL — ABNORMAL HIGH (ref 65–99)
Glucose-Capillary: 228 mg/dL — ABNORMAL HIGH (ref 65–99)

## 2015-11-28 LAB — CBC
HCT: 24.8 % — ABNORMAL LOW (ref 35.0–47.0)
HEMOGLOBIN: 7.9 g/dL — AB (ref 12.0–16.0)
MCH: 28.1 pg (ref 26.0–34.0)
MCHC: 31.9 g/dL — AB (ref 32.0–36.0)
MCV: 88.1 fL (ref 80.0–100.0)
Platelets: 196 10*3/uL (ref 150–440)
RBC: 2.82 MIL/uL — ABNORMAL LOW (ref 3.80–5.20)
RDW: 17.2 % — ABNORMAL HIGH (ref 11.5–14.5)
WBC: 7.3 10*3/uL (ref 3.6–11.0)

## 2015-11-28 LAB — DIGOXIN LEVEL: DIGOXIN LVL: 1.1 ng/mL (ref 0.8–2.0)

## 2015-11-28 MED ORDER — INSULIN DETEMIR 100 UNIT/ML ~~LOC~~ SOLN
12.0000 [IU] | Freq: Every day | SUBCUTANEOUS | Status: DC
  Administered 2015-11-28: 12 [IU] via SUBCUTANEOUS
  Filled 2015-11-28 (×2): qty 0.12

## 2015-11-28 MED ORDER — PANTOPRAZOLE SODIUM 40 MG IV SOLR
40.0000 mg | Freq: Two times a day (BID) | INTRAVENOUS | Status: DC
  Administered 2015-11-28 – 2015-11-29 (×3): 40 mg via INTRAVENOUS
  Filled 2015-11-28 (×3): qty 40

## 2015-11-28 NOTE — Progress Notes (Addendum)
Patient ID: Teresa Mercado, female   DOB: May 12, 1946, 70 y.o.   MRN: [id number] [facility] [place] NAME: Teresa Mercado    MR#:  [id number]  DATE OF BIRTH:  10/20/1945  SUBJECTIVE:   -Patient admitted with melena and drop in hemoglobin on admission. She received 2 units of packed RBC and hemoglobin is stable at 8. -Status post upper GI endoscopy showing gastritis and duodenitis. Further GI workup recommended as outpatient. Hasn't had a bowel movement after EGD yet. -Remains on oxygen at 3 L, on chronic home O2.  REVIEW OF SYSTEMS:   Review of Systems  Constitutional: Negative for fever, chills and weight loss.  HENT: Negative for ear discharge, ear pain and nosebleeds.   Eyes: Negative for blurred vision, pain and discharge.  Respiratory: Positive for shortness of breath. Negative for sputum production, wheezing and stridor.   Cardiovascular: Negative for chest pain, palpitations, orthopnea and PND.  Gastrointestinal: Negative for nausea, vomiting, abdominal pain and diarrhea.  Genitourinary: Negative for urgency and frequency.  Musculoskeletal: Positive for back pain. Negative for joint pain.  Neurological: Positive for weakness. Negative for sensory change, speech change and focal weakness.  Psychiatric/Behavioral: Negative for depression and hallucinations. The patient is not nervous/anxious.   All other systems reviewed and are negative.  Tolerating Diet: Tolerating liquids Tolerating PT: pending  DRUG ALLERGIES:   Allergies  Allergen Reactions  . Quinapril Other (See Comments)    Reaction:  Elevated potassium   . Venlafaxine Other (See Comments)    Reaction:  GI upset   . Zetia [Ezetimibe] Nausea And Vomiting    VITALS:  Blood pressure 110/33, pulse 85, temperature 97.8 F (36.6 C), temperature source Oral, resp. rate 16, height 5\' 3"  (1.6 m), weight 106.595 kg (235 lb), SpO2 97 %.  PHYSICAL EXAMINATION:   Physical  Exam  GENERAL:  70 y.o.-year-old patient lying in the bed with no acute distress.  EYES: Pupils equal, round, reactive to light and accommodation. No scleral icterus. Extraocular muscles intact. palllor HEENT: Head atraumatic, normocephalic. Oropharynx and nasopharynx clear.  NECK:  Supple, no jugular venous distention. No thyroid enlargement, no tenderness.  LUNGS: distant breath sounds bilaterally, no wheezing, rales, rhonchi. No use of accessory muscles of respiration. Decreased bibasilar breath sounds CARDIOVASCULAR: S1, S2 normal. No murmurs, rubs, or gallops.  ABDOMEN: Soft, nontender, nondistended. Bowel sounds present. No organomegaly or mass.  EXTREMITIES: No cyanosis, clubbing or edema b/l.    NEUROLOGIC: Cranial nerves II through XII are intact. No focal Motor or sensory deficits b/l.   PSYCHIATRIC:  patient is alert and oriented x 3.  SKIN: No obvious rash, lesion, or ulcer.   LABORATORY PANEL:  CBC  Recent Labs Lab 11/26/15 0508  11/26/15 1924  WBC 6.4  --   --   HGB 8.2*  < > 8.9*  HCT 24.9*  --   --   PLT 194  --   --   < > = values in this interval not displayed.  Chemistries   Recent Labs Lab 11/25/15 1457 11/26/15 0508 11/27/15 1010  NA 140 144  --   K 5.8* 5.3*  --   CL 105 112*  --   CO2 26 27  --   GLUCOSE 100* 53*  --   BUN 73* 58*  --   CREATININE 2.65* 2.27* 1.84*  CALCIUM 8.9 8.3*  --   AST 24  --   --   ALT 20  --   --  ALKPHOS 46  --   --   BILITOT 0.7  --   --    Cardiac Enzymes  Recent Labs Lab 11/25/15 1457  TROPONINI <0.03   RADIOLOGY:  No results found. ASSESSMENT AND PLAN:   Shali Hau is a 70 y.o. female with a known history of diastolic CHF, COPD, diabetes mellitus, CK D stage III with baseline creatinine around 1.6, complete heart block with a pacemaker presents to the hospital secondary to dark stools and worsening nausea and vomiting for 4 days now.  #1 Anemia- acute anemia- likely GI source- melena with nausea,  vomiting -Patient came in with 6.9-- transfused 2 units--Hemoglobin stable around 8.5 - Status post EGD yesterday showing duodenitis and gastritis. H. pylori testing is pending. -Change Protonix to IV twice a day. Advance diet to full liquids today. -Appreciate GI consult. Small bowel series, capsule endoscopy and colonoscopy as outpatient  #2 ARF on CKD stage 3- ATN likely - Hold nephrotoxins,discontinue fluids, atrophied kidneys noted on CT abd - baseline cr around 1.6, now at 1.84 -Monitor as fluids are stopped today  #3 Diastolic CHF- chronic- hold lasix stable. On chronically 2 L of home oxygen. Follow up chest x-ray today  #4 Hyperkalemia- could be from digoxin toxicity in the setting of ARF - hold lisinopril, digoxin -Repeat digitoxin level today. Potassium is corrected  #5 DM- restart low-dose levemir as patient is still on clear liquids,  -Continue to hold metformin and Januvia On SSI  #6 DVT Prophylaxis- TEDs and SCDs  Case discussed with Care Management/Social Worker. Management plans discussed with the patient, family and they are in agreement.  CODE STATUS: full  DVT Prophylaxis: scd and teds due to GI bleed  TOTAL TIME TAKING CARE OF THIS PATIENT: 30 minutes.  >50% time spent on counselling and coordination of care  Physical therapy recommended home health.  POSSIBLE D/C IN 1-2 DAYS, DEPENDING ON CLINICAL CONDITION.  Note: This dictation was prepared with Dragon dictation along with smaller phrase technology. Any transcriptional errors that result from this process are unintentional.  Isom Kochan M.D on 11/28/2015 at 10:22 AM  Between 7am to 6pm - Pager - [phone]  After 6pm go to [email or web address] - password [email or web address] [facility]  Office  [phone]  CC: Primary care physician; Idelle Crouch, MD

## 2015-11-28 NOTE — Progress Notes (Signed)
NS continuous at 60 mL/hour and patient is CHF. Dr. Jannifer Franklin notified if the fluid can be D/C, he stated if patient is not having SOB, then the fluid should continue running as scheduled. Patient is alert and oriented, no SOB or any c/o pain. Resting quietly at this time with her eyes closed, respirations even and unlabored.  Will continue to monitor.

## 2015-11-28 NOTE — Progress Notes (Signed)
Pt had comfortable day. ivf's d/c'd. protonix gtt as well. Pt remains a/o and comfortable. Pt advanced to soft diet which she is tolerating. No bm as yet post egd. Up with single assist. Tele is pacing all afternoon .

## 2015-11-29 LAB — GLUCOSE, CAPILLARY
GLUCOSE-CAPILLARY: 176 mg/dL — AB (ref 65–99)
GLUCOSE-CAPILLARY: 223 mg/dL — AB (ref 65–99)
GLUCOSE-CAPILLARY: 243 mg/dL — AB (ref 65–99)

## 2015-11-29 MED ORDER — DIGOXIN 125 MCG PO TABS
0.1250 mg | ORAL_TABLET | ORAL | Status: DC
Start: 2015-11-29 — End: 2015-11-29
  Administered 2015-11-29: 0.125 mg via ORAL
  Filled 2015-11-29: qty 1

## 2015-11-29 MED ORDER — FERROUS SULFATE 325 (65 FE) MG PO TABS: 325.0000 mg | ORAL_TABLET | Freq: Two times a day (BID) | ORAL | Status: DC

## 2015-11-29 MED ORDER — INSULIN DETEMIR 100 UNIT/ML ~~LOC~~ SOLN: 15.0000 [IU] | Freq: Every day | SUBCUTANEOUS | Status: DC

## 2015-11-29 MED ORDER — METOPROLOL TARTRATE 25 MG PO TABS
25.0000 mg | ORAL_TABLET | Freq: Two times a day (BID) | ORAL | Status: DC
  Administered 2015-11-29: 25 mg via ORAL
  Filled 2015-11-29: qty 1

## 2015-11-29 MED ORDER — DIGOXIN 125 MCG PO TABS: 0.1250 mg | ORAL_TABLET | ORAL | Status: DC

## 2015-11-29 MED ORDER — FUROSEMIDE 10 MG/ML IJ SOLN
40.0000 mg | Freq: Once | INTRAMUSCULAR | Status: AC
  Administered 2015-11-29: 40 mg via INTRAVENOUS
  Filled 2015-11-29: qty 4

## 2015-11-29 MED ORDER — PANTOPRAZOLE SODIUM 40 MG PO TBEC: 40.0000 mg | DELAYED_RELEASE_TABLET | Freq: Two times a day (BID) | ORAL | Status: DC

## 2015-11-29 NOTE — Discharge Summary (Signed)
[facility] [place] NAME: Teresa Mercado    MR#:  [id number]  DATE OF BIRTH:  [birth year]  DATE OF ADMISSION:  11/25/2015 ADMITTING PHYSICIAN: Gladstone Lighter, MD  DATE OF DISCHARGE: 11/29/2015  PRIMARY CARE PHYSICIAN: SPARKS,JEFFREY D, MD    ADMISSION DIAGNOSIS:  CHF (congestive heart failure) (HCC) [I50.9] Nausea & vomiting [R11.2]  DISCHARGE DIAGNOSIS:  Active Problems:   Anemia   SECONDARY DIAGNOSIS:   Past Medical History  Diagnosis Date  . COPD (chronic obstructive pulmonary disease) (HCC)     Not on home o2  . CHF (congestive heart failure) (HCC)     Diastolic, last ECHO from Jan 2017- normal EF 55%  . Diabetes mellitus without complication ([place])     On Levemir  . Renal disorder     CKD stage 3  . Syncope     s/p pacemaker    HOSPITAL COURSE:   Teresa Mercado is a 70 y.o. female with a known history of diastolic CHF, COPD, diabetes mellitus, CK D stage III with baseline creatinine around 1.6, complete heart block with a pacemaker presents to the hospital secondary to dark stools and worsening nausea and vomiting for 4 days now.  #1 Anemia- acute anemia- likely GI source- melena with nausea, vomiting on presentation -Patient came in with 6.9-- transfused 2 units--Hemoglobin stable around 8.0 - Status post EGD showing duodenitis and gastritis. H. pylori testing is pending. -continue Protonix to BID. Tolerating soft diet today -Appreciate GI consult. Small bowel series, capsule endoscopy and colonoscopy as outpatient  #2 ARF on CKD stage 3- ATN likely - Hold nephrotoxins, atrophied kidneys noted on CT abd - baseline cr around 1.6, and creatinine is close to baseline and stable.  #3 Diastolic CHF- chronic-  Restart lasix stable. On chronically 2 L of home oxygen. - restarted on metoprolol and digoxin- at lower dose  #4 Hyperkalemia- could be from digoxin toxicity in the setting of ARF on admission- resolved -  hold lisinopril, digoxin -Repeat digitoxin level today. Potassium is corrected  #5 DM- restarted low-dose levemir.  -restart metformin, amaryl and Januvia   Weakness at baseline, ambulates with a walker. Discharge today with home health  DISCHARGE CONDITIONS:   Guarded  CONSULTS OBTAINED:   GI consult by Dr. Gustavo Lah  DRUG ALLERGIES:   Allergies  Allergen Reactions  . Quinapril Other (See Comments)    Reaction:  Elevated potassium   . Venlafaxine Other (See Comments)    Reaction:  GI upset   . Zetia [Ezetimibe] Nausea And Vomiting    DISCHARGE MEDICATIONS:   Current Discharge Medication List    CONTINUE these medications which have CHANGED   Details  digoxin (LANOXIN) 0.125 MG tablet Take 1 tablet (0.125 mg total) by mouth every other day. Qty: 30 tablet, Refills: 1    insulin detemir (LEVEMIR) 100 UNIT/ML injection Inject 0.15 mLs (15 Units total) into the skin at bedtime. Qty: 10 mL, Refills: 11      CONTINUE these medications which have NOT CHANGED   Details  atorvastatin (LIPITOR) 80 MG tablet Take 80 mg by mouth at bedtime.    furosemide (LASIX) 40 MG tablet Take 40 mg by mouth daily.    glimepiride (AMARYL) 4 MG tablet Take 4 mg by mouth daily with breakfast.    ipratropium-albuterol (DUONEB) 0.5-2.5 (3) MG/3ML SOLN Take 3 mLs by nebulization every 6 (six) hours as needed (for wheezing/shortness of breath).    metFORMIN (GLUCOPHAGE) 500 MG tablet  Take 1,000 mg by mouth 2 (two) times daily with a meal.    metoprolol tartrate (LOPRESSOR) 25 MG tablet Take 25 mg by mouth 2 (two) times daily.    ondansetron (ZOFRAN) 4 MG tablet Take 4 mg by mouth every 8 (eight) hours as needed for nausea or vomiting.    pantoprazole (PROTONIX) 40 MG tablet Take 40 mg by mouth daily.    sitaGLIPtin (JANUVIA) 100 MG tablet Take 100 mg by mouth daily.    vitamin B-12 (CYANOCOBALAMIN) 1000 MCG tablet Take 1,000 mcg by mouth daily.      STOP taking these medications      aspirin EC 325 MG tablet      lisinopril (PRINIVIL,ZESTRIL) 10 MG tablet          DISCHARGE INSTRUCTIONS:   1. GI f/u in 1 week- will need outpatient small bowel series, capsule endoscopy and also colonoscopy Repeat CBC by GI 2. PCP f/u in 1-2 weeks  If you experience worsening of your admission symptoms, develop shortness of breath, life threatening emergency, suicidal or homicidal thoughts you must seek medical attention immediately by calling 911 or calling your MD immediately  if symptoms less severe.  You Must read complete instructions/literature along with all the possible adverse reactions/side effects for all the Medicines you take and that have been prescribed to you. Take any new Medicines after you have completely understood and accept all the possible adverse reactions/side effects.   Please note  You were cared for by a hospitalist during your hospital stay. If you have any questions about your discharge medications or the care you received while you were in the hospital after you are discharged, you can call the unit and asked to speak with the hospitalist on call if the hospitalist that took care of you is not available. Once you are discharged, your primary care physician will handle any further medical issues. Please note that NO REFILLS for any discharge medications will be authorized once you are discharged, as it is imperative that you return to your primary care physician (or establish a relationship with a primary care physician if you do not have one) for your aftercare needs so that they can reassess your need for medications and monitor your lab values.    Today   CHIEF COMPLAINT:   Chief Complaint  Patient presents with  . Altered Mental Status    VITAL SIGNS:  Blood pressure 119/77, pulse 116, temperature 98.2 F (36.8 C), temperature source Oral, resp. rate 20, height 5\' 3"  (1.6 m), weight 106.595 kg (235 lb), SpO2 91 %.  I/O:   Intake/Output  Summary (Last 24 hours) at 11/29/15 1500 Last data filed at 11/29/15 1342  Gross per 24 hour  Intake    120 ml  Output   1900 ml  Net  -1780 ml    PHYSICAL EXAMINATION:   Physical Exam  GENERAL: 70 y.o.-year-old patient lying in the bed with no acute distress.  EYES: Pupils equal, round, reactive to light and accommodation. No scleral icterus. Extraocular muscles intact. palllor HEENT: Head atraumatic, normocephalic. Oropharynx and nasopharynx clear.  NECK: Supple, no jugular venous distention. No thyroid enlargement, no tenderness.  LUNGS: distant breath sounds bilaterally, no wheezing, rales, rhonchi. No use of accessory muscles of respiration. Decreased bibasilar breath sounds CARDIOVASCULAR: S1, S2 normal. No murmurs, rubs, or gallops.  ABDOMEN: Soft, nontender, nondistended. Bowel sounds present. No organomegaly or mass.  EXTREMITIES: No cyanosis, clubbing or edema b/l.  NEUROLOGIC: Cranial nerves II  through XII are intact. No focal Motor or sensory deficits b/l.  PSYCHIATRIC: patient is alert and oriented x 3.  SKIN: No obvious rash, lesion, or ulcer.   DATA REVIEW:   CBC  Recent Labs Lab 11/28/15 1018  WBC 7.3  HGB 7.9*  HCT 24.8*  PLT 196    Chemistries   Recent Labs Lab 11/25/15 1457  11/28/15 1018  NA 140  < > 139  K 5.8*  < > 4.4  CL 105  < > 108  CO2 26  < > 25  GLUCOSE 100*  < > 214*  BUN 73*  < > 25*  CREATININE 2.65*  < > 1.63*  CALCIUM 8.9  < > 7.7*  AST 24  --   --   ALT 20  --   --   ALKPHOS 46  --   --   BILITOT 0.7  --   --   < > = values in this interval not displayed.  Cardiac Enzymes  Recent Labs Lab 11/25/15 [place] <0.03    Microbiology Results  No results found for this or any previous visit.  RADIOLOGY:  Dg Chest 2 View  11/28/2015  CLINICAL DATA:  Shortness of breath EXAM: CHEST  2 VIEW COMPARISON:  November 25, 2015 FINDINGS: No pneumothorax. Stable cardiomegaly. Mild opacity in left lung base  persists. A pleural effusion is seen on the lateral view. Suggested mild edema. No other acute abnormalities. IMPRESSION: 1. Mild edema and small effusion. 2. Opacity in the left base is mildly more focal. Recommend attention on follow-up. Electronically Signed   By: Dorise Bullion III M.D   On: 11/28/2015 11:25    EKG:   Orders placed or performed during the hospital encounter of 11/25/15  . ED EKG  . ED EKG  . EKG 12-Lead  . EKG 12-Lead      Management plans discussed with the patient, family and they are in agreement.  CODE STATUS:     Code Status Orders        Start     Ordered   11/25/15 1900  Full code   Continuous     11/25/15 1859    Code Status History    Date Active Date Inactive Code Status Order ID Comments User Context   This patient has a current code status but no historical code status.      TOTAL TIME TAKING CARE OF THIS PATIENT: 40 minutes.    Gladstone Lighter M.D on 11/29/2015 at 3:00 PM  Between 7am to 6pm - Pager - [phone]  After 6pm go to [email or web address] - password [email or web address] [facility]  Office  [phone]  CC: Primary care physician; Idelle Crouch, MD

## 2015-11-29 NOTE — Care Management Note (Signed)
Case Management Note  Patient Details  Name: [name] MRN: [id number] Date of Birth: [birth year]  Subjective/Objective:         Mrs Teresa Mercado is an open client of [name]. A referral was faxed and called to [facility] requesting resumption of care for PT, OT, RN.            Action/Plan:   Expected Discharge Date:                  Expected Discharge Plan:     In-House Referral:     Discharge planning Services     Post Acute Care Choice:    Choice offered to:     DME Arranged:    DME Agency:     HH Arranged:    [facility] Agency:     Status of Service:     Medicare Important Message Given:  Yes Date Medicare IM Given:    Medicare IM give by:    Date Additional Medicare IM Given:    Additional Medicare Important Message give by:     If discussed at [facility] of Stay Meetings, dates discussed:    Additional Comments:  Jery Hollern A, RN 11/29/2015, 3:04 PM

## 2015-11-30 ENCOUNTER — Ambulatory Visit: Payer: Self-pay

## 2015-12-01 LAB — SURGICAL PATHOLOGY

## 2015-12-08 ENCOUNTER — Encounter (INDEPENDENT_AMBULATORY_CARE_PROVIDER_SITE_OTHER): Payer: Self-pay

## 2015-12-08 ENCOUNTER — Encounter: Payer: Self-pay | Admitting: Cardiovascular Disease

## 2015-12-08 ENCOUNTER — Ambulatory Visit (INDEPENDENT_AMBULATORY_CARE_PROVIDER_SITE_OTHER): Payer: Medicare Other | Admitting: Cardiovascular Disease

## 2015-12-08 VITALS — BP 110/58 | HR 75 | Ht 63.0 in | Wt 242.0 lb

## 2015-12-08 DIAGNOSIS — Z950 Presence of cardiac pacemaker: Secondary | ICD-10-CM

## 2015-12-08 DIAGNOSIS — R0602 Shortness of breath: Secondary | ICD-10-CM

## 2015-12-08 DIAGNOSIS — D500 Iron deficiency anemia secondary to blood loss (chronic): Secondary | ICD-10-CM

## 2015-12-08 DIAGNOSIS — R600 Localized edema: Secondary | ICD-10-CM

## 2015-12-08 DIAGNOSIS — I5032 Chronic diastolic (congestive) heart failure: Secondary | ICD-10-CM | POA: Diagnosis not present

## 2015-12-08 MED ORDER — FUROSEMIDE 40 MG PO TABS: 40.0000 mg | ORAL_TABLET | Freq: Two times a day (BID) | ORAL | Status: DC

## 2015-12-08 MED ORDER — ESOMEPRAZOLE MAGNESIUM 40 MG PO CPDR: 40.0000 mg | DELAYED_RELEASE_CAPSULE | Freq: Every day | ORAL | Status: DC

## 2015-12-08 NOTE — Assessment & Plan Note (Signed)
Significant lower extremity edema, recommended leg elevation, Lasix 40 mg twice a day, Ace wraps if tolerated   Total encounter time more than 45 minutes  Greater than 50% was spent in counseling and coordination of care with the patient

## 2015-12-08 NOTE — Assessment & Plan Note (Signed)
Recent GI bleed Unable to tolerate pantoprazole Recommend she talk with primary care, perhaps restart Nexium which she was taking in the past Also recommended she start on her iron

## 2015-12-08 NOTE — Assessment & Plan Note (Signed)
We will schedule an appointment with Dr. Caryl Comes to have her pacemaker evaluated This has not been evaluated since she arrived in [place] from [place] Presume he had sick sinus syndrome, she volunteers that she had 10 second pause, previous episodes of syncope

## 2015-12-08 NOTE — Assessment & Plan Note (Signed)
We have encouraged continued exercise, careful diet management in an effort to lose weight. 

## 2015-12-08 NOTE — Progress Notes (Signed)
Patient ID: Teresa Mercado, female    DOB: 08-Jul-1946, 70 y.o.   MRN: [id number]  HPI Comments: Teresa Mercado is a 70 y.o. female with a known history of diastolic CHF, COPD, diabetes mellitus, CK D stage III with baseline creatinine around 1.6, complete heart block with a pacemaker who recently presented to the [facility] with  dark stools and worsening nausea and vomiting for 4 days, found to have GI bleed, hematocrit 6.9 Requiring inpatient stay, blood transfusion 2. She presents today to establish care in the [facility] cardiology office for routine care of her chronic systolic CHF . Previously seen at [facility], she has indicated that she prefers to be seen by our office and is referred by Dr. Doy Hutching.   On today's visit, she reports that she was started on pantoprazole while in the hospital . She strongly feels this is causing diarrhea and she has stopped the medication . This was presumably started for findings from her EGD showing gastritis, erosions . Diarrhea is slowly improving. She has not started iron supplement yet, secondary to her diarrhea has not been able to go to the store to pick this up. She has been taking Lasix 40 mg daily, reports having worsening leg swelling since she left the hospital also with abdominal swelling. She does have shortness of breath, sleeps with several pillows legs are tight, very distended, tender to touch. She has been using oxygen at home for sleep, also for walking long distances..  She reports that she has not needed to take potassium in the past  EKG dated 11/25/2015 shows ventricular paced rhythm    Other past medical history   pacer placed in the past for sick sinus syndrome, reportedly had 10 second pause per the patient    Allergies  Allergen Reactions  . Quinapril Other (See Comments)    Reaction:  Elevated potassium   . Venlafaxine Other (See Comments)    Reaction:  GI upset   . Zetia [Ezetimibe] Nausea And Vomiting    Current Outpatient  Prescriptions on File Prior to Visit  Medication Sig Dispense Refill  . atorvastatin (LIPITOR) 80 MG tablet Take 80 mg by mouth at bedtime.    . digoxin (LANOXIN) 0.125 MG tablet Take 1 tablet (0.125 mg total) by mouth every other day. 30 tablet 1  . ferrous sulfate (FERROUSUL) 325 (65 FE) MG tablet Take 1 tablet (325 mg total) by mouth 2 (two) times daily with a meal. 60 tablet 2  . glimepiride (AMARYL) 4 MG tablet Take 4 mg by mouth daily with breakfast.    . insulin detemir (LEVEMIR) 100 UNIT/ML injection Inject 0.15 mLs (15 Units total) into the skin at bedtime. 10 mL 11  . ipratropium-albuterol (DUONEB) 0.5-2.5 (3) MG/3ML SOLN Take 3 mLs by nebulization every 6 (six) hours as needed (for wheezing/shortness of breath).    . metFORMIN (GLUCOPHAGE) 500 MG tablet Take 1,000 mg by mouth 2 (two) times daily with a meal.    . metoprolol tartrate (LOPRESSOR) 25 MG tablet Take 25 mg by mouth 2 (two) times daily.    . ondansetron (ZOFRAN) 4 MG tablet Take 4 mg by mouth every 8 (eight) hours as needed for nausea or vomiting.    . sitaGLIPtin (JANUVIA) 100 MG tablet Take 100 mg by mouth daily.    . vitamin B-12 (CYANOCOBALAMIN) 1000 MCG tablet Take 1,000 mcg by mouth daily.     No current facility-administered medications on file prior to visit.    Past Medical History  Diagnosis Date  . COPD (chronic obstructive pulmonary disease) (HCC)     Not on home o2  . CHF (congestive heart failure) (HCC)     Diastolic, last ECHO from Jan 2017- normal EF 55%  . Diabetes mellitus without complication ([place])     On Levemir  . Renal disorder     CKD stage 3  . Syncope     s/p pacemaker    Past Surgical History  Procedure Laterality Date  . Pacemaker insertion    . Appendectomy    . Tonsillectomy    . Tubal ligation    . Esophagogastroduodenoscopy N/A 11/27/2015    Procedure: ESOPHAGOGASTRODUODENOSCOPY (EGD);  Surgeon: Lollie Sails, MD;  Location[facility] ENDOSCOPY;  Service: Endoscopy;  Laterality:  N/A;    Social History  reports that she quit smoking about 10 years ago. Her smoking use included Cigarettes. She smoked 2.00 packs per day. She does not have any smokeless tobacco history on file. She reports that she does not drink alcohol or use illicit drugs.  Family History family history includes Esophageal cancer in her brother; Kidney cancer in her father.    Review of Systems  Constitutional: Negative.   Respiratory: Positive for shortness of breath.   Cardiovascular: Positive for leg swelling.  Gastrointestinal: Positive for diarrhea.  Musculoskeletal: Negative.   Neurological: Negative.   Hematological: Negative.   Psychiatric/Behavioral: Negative.   All other systems reviewed and are negative.   BP 110/58 mmHg  Pulse 75  Ht 5\' 3"  (1.6 m)  Wt 242 lb (109.77 kg)  BMI 42.88 kg/m2  SpO2 94%   Physical Exam  Constitutional: She is oriented to person, place, and time. She appears well-developed and well-nourished.  HENT:  Head: Normocephalic.  Nose: Nose normal.  Mouth/Throat: Oropharynx is clear and moist.  Eyes: Conjunctivae are normal. Pupils are equal, round, and reactive to light.  Neck: Normal range of motion. Neck supple. No JVD present.  Cardiovascular: Normal rate, regular rhythm, normal heart sounds and intact distal pulses.  Exam reveals no gallop and no friction rub.   No murmur heard. Bilaterally 2+ edema to below the knees  Pulmonary/Chest: Effort normal and breath sounds normal. No respiratory distress. She has no wheezes. She has no rales. She exhibits no tenderness.  Abdominal: Soft. Bowel sounds are normal. She exhibits no distension. There is no tenderness.  Musculoskeletal: Normal range of motion. She exhibits edema. She exhibits no tenderness.  Lymphadenopathy:    She has no cervical adenopathy.  Neurological: She is alert and oriented to person, place, and time. Coordination normal.  Skin: Skin is warm and dry. No rash noted. No erythema.   Psychiatric: She has a normal mood and affect. Her behavior is normal. Judgment and thought content normal.

## 2015-12-08 NOTE — Patient Instructions (Addendum)
You are doing well.  Please increase lasix up to one pill twice a day  We will set up an appt with Dr. Caryl Comes for pacemaker  Consider kidney labs in 2 weeks  Please call us if you have new issues that need to be addressed before your next appt.  Your physician wants you to follow-up in: 1 month.

## 2015-12-08 NOTE — Assessment & Plan Note (Signed)
Significant leg swelling on today's visit consistent with acute on chronic diastolic CHF.  recommended that she increase her Lasix up to 40 mg twice a day, decrease her fluid intake.  Symptoms likely exacerbated by underlying anemia. in the past has not needed potassium.

## 2015-12-08 NOTE — Assessment & Plan Note (Signed)
Shortness of breath secondary to anemia, deconditioning, obesity, acute on chronic diastolic CHF.

## 2016-01-22 ENCOUNTER — Encounter: Payer: Self-pay | Admitting: Cardiovascular Disease

## 2016-01-22 ENCOUNTER — Ambulatory Visit (INDEPENDENT_AMBULATORY_CARE_PROVIDER_SITE_OTHER): Payer: Medicare Other | Admitting: Cardiovascular Disease

## 2016-01-22 VITALS — BP 145/76 | HR 84 | Ht 63.0 in | Wt 227.8 lb

## 2016-01-22 DIAGNOSIS — R0602 Shortness of breath: Secondary | ICD-10-CM

## 2016-01-22 DIAGNOSIS — I5032 Chronic diastolic (congestive) heart failure: Secondary | ICD-10-CM

## 2016-01-22 DIAGNOSIS — R600 Localized edema: Secondary | ICD-10-CM

## 2016-01-22 DIAGNOSIS — Z950 Presence of cardiac pacemaker: Secondary | ICD-10-CM

## 2016-01-22 DIAGNOSIS — D500 Iron deficiency anemia secondary to blood loss (chronic): Secondary | ICD-10-CM

## 2016-01-22 NOTE — Assessment & Plan Note (Signed)
Needs follow-up with Dr. Caryl Comes

## 2016-01-22 NOTE — Assessment & Plan Note (Signed)
Shortness of breath has resolved with 15 pound diuresis in the past 6 weeks Feels that she is close to her baseline No other changes to her medications made at this time In follow-up we will consider adding low-dose ARB. She is currently on metoprolol. This could be changed to carvedilol. She is inclined to leave things alone as she feels well

## 2016-01-22 NOTE — Assessment & Plan Note (Signed)
We have encouraged continued exercise, careful diet management in an effort to lose weight. 

## 2016-01-22 NOTE — Assessment & Plan Note (Signed)
Dramatic improvement in her status in the past 6 weeks Weight declined, improved leg edema Suspect she may have 5 pounds more of weight loss to go before she is at her baseline Recommended she stay on Lasix 40 mg twice a day for now, With closely monitor her weight. After leg edema has resolved, if she develops dry mouth would potentially decrease Lasix down to 40 mg daily with close monitoring of her weight to maintain stable numbers

## 2016-01-22 NOTE — Assessment & Plan Note (Signed)
Dramatic improvement in her numbers. This were discussed with her She will stay on iron daily

## 2016-01-22 NOTE — Progress Notes (Signed)
Patient ID: Teresa Mercado, female    DOB: 09/28/1945, 70 y.o.   MRN: [id number]  HPI Comments: Teresa Mercado is a 70 y.o. female with a known history of diastolic CHF, COPD, diabetes mellitus, CK D stage III with baseline creatinine around 1.6, complete heart block with a pacemaker who recently presented to the [facility] with  dark stools and worsening nausea and vomiting for 4 days, found to have GI bleed, hematocrit 6.9 Requiring inpatient stay, blood transfusion 2. She presents today for routine follow-up of her chronic diastolic CHF  In follow-up today, she reports that she is feeling much better Leg swelling has improved, weight is down from 242 pounds in March 2017 now 227 pounds Legs are less tight, still mildly painful to outpatient Previously unable to wear compression hose, has not tried recently  Review of lab work with her in the office today shows hematocrit from a low of 27 now 33.3 Creatinine 2.1 now 1.6 with BUN previously 56 now 23 Hemoglobin A1c 7.4 now down to 7.2 Total cholesterol 158, LDL 65  Overall she is happy with her direction, started to cook more, do her ADLs, feels more comfortable Denies any PND or orthopnea She has been taking Nexium twice a day by mistake Taking Lasix 40 mg twice a day No longer needing oxygen during the daytime, but does sleep with oxygen  Other past medical history reviewed Previous EGD showing gastritis, erosions . Previously had diarrhea  EKG dated 11/25/2015 shows ventricular paced rhythm  Other past medical history   pacer placed in the past for sick sinus syndrome, reportedly had 10 second pause per the patient    Allergies  Allergen Reactions  . Quinapril Other (See Comments)    Reaction:  Elevated potassium   . Venlafaxine Other (See Comments)    Reaction:  GI upset   . Zetia [Ezetimibe] Nausea And Vomiting    Current Outpatient Prescriptions on File Prior to Visit  Medication Sig Dispense Refill  . atorvastatin (LIPITOR) 80  MG tablet Take 80 mg by mouth at bedtime.    . digoxin (LANOXIN) 0.125 MG tablet Take 1 tablet (0.125 mg total) by mouth every other day. 30 tablet 1  . esomeprazole (NEXIUM) 40 MG capsule Take 1 capsule (40 mg total) by mouth daily at 12 noon. 30 capsule 6  . ferrous sulfate (FERROUSUL) 325 (65 FE) MG tablet Take 1 tablet (325 mg total) by mouth 2 (two) times daily with a meal. 60 tablet 2  . furosemide (LASIX) 40 MG tablet Take 1 tablet (40 mg total) by mouth 2 (two) times daily. 60 tablet 6  . glimepiride (AMARYL) 4 MG tablet Take 4 mg by mouth daily with breakfast.    . insulin detemir (LEVEMIR) 100 UNIT/ML injection Inject 0.15 mLs (15 Units total) into the skin at bedtime. 10 mL 11  . ipratropium-albuterol (DUONEB) 0.5-2.5 (3) MG/3ML SOLN Take 3 mLs by nebulization every 6 (six) hours as needed (for wheezing/shortness of breath).    . metFORMIN (GLUCOPHAGE) 500 MG tablet Take 1,000 mg by mouth 2 (two) times daily with a meal.    . metoprolol tartrate (LOPRESSOR) 25 MG tablet Take 25 mg by mouth 2 (two) times daily.    . ondansetron (ZOFRAN) 4 MG tablet Take 4 mg by mouth every 8 (eight) hours as needed for nausea or vomiting.    . sitaGLIPtin (JANUVIA) 100 MG tablet Take 100 mg by mouth daily.    . vitamin B-12 (CYANOCOBALAMIN) 1000 MCG  tablet Take 1,000 mcg by mouth daily.     No current facility-administered medications on file prior to visit.    Past Medical History  Diagnosis Date  . COPD (chronic obstructive pulmonary disease) (HCC)     Not on home o2  . CHF (congestive heart failure) (HCC)     Diastolic, last ECHO from Jan 2017- normal EF 55%  . Diabetes mellitus without complication ([place])     On Levemir  . Renal disorder     CKD stage 3  . Syncope     s/p pacemaker    Past Surgical History  Procedure Laterality Date  . Pacemaker insertion    . Appendectomy    . Tonsillectomy    . Tubal ligation    . Esophagogastroduodenoscopy N/A 11/27/2015    Procedure:  ESOPHAGOGASTRODUODENOSCOPY (EGD);  Surgeon: Lollie Sails, MD;  Location: [facility] ENDOSCOPY;  Service: Endoscopy;  Laterality: N/A;    Social History  reports that she quit smoking about 10 years ago. Her smoking use included Cigarettes. She smoked 2.00 packs per day. She does not have any smokeless tobacco history on file. She reports that she does not drink alcohol or use illicit drugs.  Family History family history includes Esophageal cancer in her brother; Kidney cancer in her father.   Review of Systems  Respiratory: Negative.   Cardiovascular: Positive for leg swelling.  Gastrointestinal: Negative.   Musculoskeletal: Negative.   Neurological: Positive for weakness.  Hematological: Negative.   Psychiatric/Behavioral: Negative.   All other systems reviewed and are negative.   BP 145/76 mmHg  Pulse 84  Ht 5\' 3"  (1.6 m)  Wt 227 lb 12 oz (103.307 kg)  BMI 40.35 kg/m2   Physical Exam  Constitutional: She is oriented to person, place, and time. She appears well-developed and well-nourished.  Obese  HENT:  Head: Normocephalic.  Nose: Nose normal.  Mouth/Throat: Oropharynx is clear and moist.  Eyes: Conjunctivae are normal. Pupils are equal, round, and reactive to light.  Neck: Normal range of motion. Neck supple. No JVD present.  Cardiovascular: Normal rate, regular rhythm, normal heart sounds and intact distal pulses.  Exam reveals no gallop and no friction rub.   No murmur heard. Trace to 1+ edema above the sock line, tender to palpation  Pulmonary/Chest: Effort normal and breath sounds normal. No respiratory distress. She has no wheezes. She has no rales. She exhibits no tenderness.  Abdominal: Soft. Bowel sounds are normal. She exhibits no distension. There is no tenderness.  Musculoskeletal: Normal range of motion. She exhibits edema. She exhibits no tenderness.  Lymphadenopathy:    She has no cervical adenopathy.  Neurological: She is alert and oriented to person,  place, and time. Coordination normal.  Skin: Skin is warm and dry. No rash noted. No erythema.  Psychiatric: She has a normal mood and affect. Her behavior is normal. Judgment and thought content normal.

## 2016-01-22 NOTE — Assessment & Plan Note (Signed)
Leg edema has improved. Recommended she retry compression hose, leg elevation when she is sitting Suspect she has additional 5 pound weight loss to go Symptoms predominantly secondary to anemia contributing to her acute on chronic diastolic CHF   Total encounter time more than 25 minutes  Greater than 50% was spent in counseling and coordination of care with the patient

## 2016-01-22 NOTE — Patient Instructions (Signed)
You are doing well. No medication changes were made.  Please call us if you have new issues that need to be addressed before your next appt.  Your physician wants you to follow-up in: 6 months.  You will receive a reminder letter in the mail two months in advance. If you don't receive a letter, please call our office to schedule the follow-up appointment.   

## 2016-01-25 NOTE — Progress Notes (Signed)
ELECTROPHYSIOLOGY CONSULT NOTE  Patient ID: Teresa Mercado, MRN: [id number], DOB/AGE: January 02, 1946 70 y.o. Admit date: (Not on file) Date of Consult: 01/26/2016  Primary Physician: Idelle Crouch, MD Primary Cardiologist: TG  Chief Complaint: est pacemaker   HPI Teresa Mercado is a 70 y.o. female  Referred for follow-up of her pacemaker implanted 2010 for complete heart block. She had syncope at that time. She underwent generator replacement 12/15 in [place].  She has moderate dyspnea on exertion. She has occasional peripheral edema. She denies chest pain..    he  has no known history of atrial fibrillation*   She carries a diagnosis of diastolic heart failure. Echocardiogram 1/17 demonstrated normal left ventricular function and moderate LAE (46)  She has a history of GI bleeding. She underwent upper endoscopy but refused lower endoscopy. She is on iron replacement   Past Medical History  Diagnosis Date  . COPD (chronic obstructive pulmonary disease) (HCC)     Not on home o2  . CHF (congestive heart failure) (HCC)     Diastolic, last ECHO from Jan 2017- normal EF 55%  . Diabetes mellitus without complication ([place])     On Levemir  . Renal disorder     CKD stage 3  . Syncope     s/p pacemaker      Surgical History:  Past Surgical History  Procedure Laterality Date  . Pacemaker insertion    . Appendectomy    . Tonsillectomy    . Tubal ligation    . Esophagogastroduodenoscopy N/A 11/27/2015    Procedure: ESOPHAGOGASTRODUODENOSCOPY (EGD);  Surgeon: Lollie Sails, MD;  Location: [facility] ENDOSCOPY;  Service: Endoscopy;  Laterality: N/A;     Home Meds: Prior to Admission medications   Medication Sig Start Date End Date Taking? Authorizing Provider  atorvastatin (LIPITOR) 80 MG tablet Take 80 mg by mouth at bedtime.    Historical Provider, MD  digoxin (LANOXIN) 0.125 MG tablet Take 1 tablet (0.125 mg total) by mouth every other day. 11/29/15   Gladstone Lighter, MD    esomeprazole (NEXIUM) 40 MG capsule Take 1 capsule (40 mg total) by mouth daily at 12 noon. 12/08/15   Minna Merritts, MD  ferrous sulfate (FERROUSUL) 325 (65 FE) MG tablet Take 1 tablet (325 mg total) by mouth 2 (two) times daily with a meal. 11/29/15   Gladstone Lighter, MD  furosemide (LASIX) 40 MG tablet Take 1 tablet (40 mg total) by mouth 2 (two) times daily. 12/08/15   Minna Merritts, MD  glimepiride (AMARYL) 4 MG tablet Take 4 mg by mouth daily with breakfast.    Historical Provider, MD  insulin detemir (LEVEMIR) 100 UNIT/ML injection Inject 0.15 mLs (15 Units total) into the skin at bedtime. 11/29/15   Gladstone Lighter, MD  ipratropium-albuterol (DUONEB) 0.5-2.5 (3) MG/3ML SOLN Take 3 mLs by nebulization every 6 (six) hours as needed (for wheezing/shortness of breath).    Historical Provider, MD  metFORMIN (GLUCOPHAGE) 500 MG tablet Take 1,000 mg by mouth 2 (two) times daily with a meal.    Historical Provider, MD  metoprolol tartrate (LOPRESSOR) 25 MG tablet Take 25 mg by mouth 2 (two) times daily.    Historical Provider, MD  ondansetron (ZOFRAN) 4 MG tablet Take 4 mg by mouth every 8 (eight) hours as needed for nausea or vomiting.    Historical Provider, MD  sitaGLIPtin (JANUVIA) 100 MG tablet Take 100 mg by mouth daily.    Historical Provider, MD  vitamin B-12 (CYANOCOBALAMIN) 1000 MCG  tablet Take 1,000 mcg by mouth daily.    Historical Provider, MD     Allergies:  Allergies  Allergen Reactions  . Quinapril Other (See Comments)    Reaction:  Elevated potassium   . Venlafaxine Other (See Comments)    Reaction:  GI upset   . Zetia [Ezetimibe] Nausea And Vomiting    Social History   Social History  . Marital Status: Married    Spouse Name: N/A  . Number of Children: N/A  . Years of Education: N/A   Occupational History  . Not on file.   Social History Main Topics  . Smoking status: Former Smoker -- 2.00 packs/day    Types: Cigarettes    Quit date: 11/24/2005  .  Smokeless tobacco: Not on file     Comment: Quit 10 years ago, but has 45 years smoking prior to that  . Alcohol Use: No  . Drug Use: No  . Sexual Activity: Not on file   Other Topics Concern  . Not on file   Social History Narrative   Lives at home with husband.     Family History  Problem Relation Age of Onset  . Kidney cancer Father   . Esophageal cancer Brother      ROS:  Please see the history of present illness.     All other systems reviewed and negative.    Physical Exam: Blood pressure 126/68, pulse 77, height 5\' 3"  (1.6 m), weight 227 lb 8 oz (103.193 kg). General: Well developed, well nourished female in no acute distress. Head: Normocephalic, atraumatic, sclera non-icteric, no xanthomas, nares are without discharge. EENT: normal Lymph Nodes:  none Back: without scoliosis/kyphosis, no CVA tendersness Neck: Negative for carotid bruits. JVD not elevated. Lungs: Clear bilaterally to auscultation without wheezes, rales, or rhonchi. Breathing is unlabored. Heart: RRR with S1 S2. No* * murmur , rubs, or gallops appreciated. Abdomen: Soft, non-tender, non-distended with normoactive bowel sounds. No hepatomegaly. No rebound/guarding. No obvious abdominal masses. Msk:  Strength and tone appear normal for age. Extremities: No clubbing or cyanosis. tr edema.  Distal pedal pulses are 2+ and equal bilaterally. Skin: Warm and Dry Neuro: Alert and oriented X 3. CN III-XII intact Grossly normal sensory and motor function . Psych:  Responds to questions appropriately with a normal affect.      Labs: Cardiac Enzymes No results for input(s): CKTOTAL, CKMB, TROPONINI in the last 72 hours. CBC Lab Results  Component Value Date   WBC 7.3 11/28/2015   HGB 7.9* 11/28/2015   HCT 24.8* 11/28/2015   MCV 88.1 11/28/2015   PLT 196 11/28/2015   PROTIME: No results for input(s): LABPROT, INR in the last 72 hours. Chemistry No results for input(s): NA, K, CL, CO2, BUN, CREATININE,  CALCIUM, PROT, BILITOT, ALKPHOS, ALT, AST, GLUCOSE in the last 168 hours.  Invalid input(s): LABALBU Lipids No results found for: CHOL, HDL, LDLCALC, TRIG BNP No results found for: PROBNP Thyroid Function Tests: No results for input(s): TSH, T4TOTAL, T3FREE, THYROIDAB in the last 72 hours.  Invalid input(s): FREET3    Miscellaneous No results found for: DDIMER  Radiology/Studies:  No results found.  EKG: Sinus rhythm with PVCs synchronous pacing   Assessment and Plan:  Pacemaker  Syncope  Renal Insufficiency w DM  COPD  GI bleeding on iron replacement   DM   Pacemaker function is normal.  No evidence of atrial fibrillation.  Not on remote; we will have her come back in 6 months and we  will be getting that. Her equipment got misplaced and the moved from [facility]  No interval syncope    I reviewed with her the concerns regarding GI bleeding and her decision not to undergo colonoscopy  Virl Axe

## 2016-01-26 ENCOUNTER — Ambulatory Visit (INDEPENDENT_AMBULATORY_CARE_PROVIDER_SITE_OTHER): Payer: Medicare Other | Admitting: Internal Medicine

## 2016-01-26 ENCOUNTER — Encounter: Payer: Self-pay | Admitting: Internal Medicine

## 2016-01-26 VITALS — BP 126/68 | HR 77 | Ht 63.0 in | Wt 227.5 lb

## 2016-01-26 DIAGNOSIS — Z950 Presence of cardiac pacemaker: Secondary | ICD-10-CM | POA: Diagnosis not present

## 2016-01-26 DIAGNOSIS — I5032 Chronic diastolic (congestive) heart failure: Secondary | ICD-10-CM

## 2016-01-26 DIAGNOSIS — I442 Atrioventricular block, complete: Secondary | ICD-10-CM | POA: Diagnosis not present

## 2016-01-26 NOTE — Patient Instructions (Signed)
Medication Instructions: - no changes  Labwork: - none  Procedures/Testing: - none  Follow-Up: - Your physician wants you to follow-up in: 6 months with Raquel Sarna in the [place] year with Dr. Caryl Comes. You will receive a reminder letter in the mail two months in advance. If you don't receive a letter, please call our office to schedule the follow-up appointment.  Any Additional Special Instructions Will Be Listed Below (If Applicable).     If you need a refill on your cardiac medications before your next appointment, please call your pharmacy.

## 2016-01-27 LAB — CUP PACEART INCLINIC DEVICE CHECK
Date Time Interrogation Session: 20170510163522
Implantable Lead Location: 753860
Implantable Lead Model: 5076
MDC IDC LEAD IMPLANT DT: 20091106
MDC IDC LEAD IMPLANT DT: 20151221
MDC IDC LEAD LOCATION: 753859

## 2016-06-07 DIAGNOSIS — E1165 Type 2 diabetes mellitus with hyperglycemia: Secondary | ICD-10-CM | POA: Insufficient documentation

## 2016-08-05 ENCOUNTER — Ambulatory Visit (INDEPENDENT_AMBULATORY_CARE_PROVIDER_SITE_OTHER): Payer: Medicare Other | Admitting: Cardiovascular Disease

## 2016-08-05 ENCOUNTER — Encounter: Payer: Self-pay | Admitting: Cardiovascular Disease

## 2016-08-05 VITALS — BP 164/78 | HR 84 | Ht 63.0 in | Wt 216.5 lb

## 2016-08-05 DIAGNOSIS — N183 Chronic kidney disease, stage 3 unspecified: Secondary | ICD-10-CM | POA: Insufficient documentation

## 2016-08-05 DIAGNOSIS — Z950 Presence of cardiac pacemaker: Secondary | ICD-10-CM

## 2016-08-05 DIAGNOSIS — I5032 Chronic diastolic (congestive) heart failure: Secondary | ICD-10-CM

## 2016-08-05 DIAGNOSIS — R0602 Shortness of breath: Secondary | ICD-10-CM

## 2016-08-05 DIAGNOSIS — E1165 Type 2 diabetes mellitus with hyperglycemia: Secondary | ICD-10-CM

## 2016-08-05 DIAGNOSIS — I442 Atrioventricular block, complete: Secondary | ICD-10-CM | POA: Insufficient documentation

## 2016-08-05 MED ORDER — CLONIDINE HCL 0.1 MG PO TABS: 0.1000 mg | ORAL_TABLET | Freq: Two times a day (BID) | ORAL | 11 refills | Status: DC

## 2016-08-05 NOTE — Progress Notes (Signed)
Cardiology Office Note  Date:  08/05/2016   ID:  Teresa Mercado, DOB [birth year], MRN [id number]  PCP:  Idelle Crouch, MD   Chief Complaint  Patient presents with  . other     6 month f/u. pt states she is doing well. Reviewed meds with pt verbally.    HPI:  Teresa Mercado is a 70 y.o. female with a known history of diastolic CHF, COPD, diabetes mellitus, CKD stage III with baseline creatinine around 1.6 to 1.8, complete heart block with a pacemaker (initially presenting with syncope), prior history of GI bleed, hematocrit 6.9 requiring transfusion 2 whopresents today for routine follow-up of her chronic diastolic CHF  In general she reports that she Feels well,  No edema, takes Lasix 20 mg daily Sometimes with SOB when walking, does not like the cold air Trace ankle edema, wears compression hose  Diabetes has not been as well controlled, managed by Dr. Doy Hutching and endocrinology Continues to slowly lose weight No regular exercise program, in fact is very sedentary  Lab work reviewed with her in detail  Lab work reviewed July 2017 Creatinine 1.8, BUN 35, up from creatinine 1.6 in April Hematocrit 34.6, stable Hemoglobin A1c 10.7, up from 7.2 in April  Seen by Dr. Caryl Comes May 2017, device interrogated  Echocardiogram January 2017 Ejection fraction greater than 55%, No significant valve disease  Weight down 216, down from 227, previously from 247  EKG on today's visit shows paced rhythm rate 84 bpm  Other past medical history reviewed Previous EGD showing gastritis, erosions . Previously had diarrhea  EKG dated 11/25/2015 shows ventricular paced rhythm  Other past medical history   pacer placed in the past for sick sinus syndrome, reportedly had 10 second pause per the patient   PMH:   has a past medical history of CHF (congestive heart failure) ([place]); COPD (chronic obstructive pulmonary disease) ([place]); Diabetes mellitus without complication ([place]); Renal disorder; and  Syncope.  PSH:    Past Surgical History:  Procedure Laterality Date  . APPENDECTOMY    . ESOPHAGOGASTRODUODENOSCOPY N/A 11/27/2015   Procedure: ESOPHAGOGASTRODUODENOSCOPY (EGD);  Surgeon: Lollie Sails, MD;  Location: [facility] ENDOSCOPY;  Service: Endoscopy;  Laterality: N/A;  . PACEMAKER INSERTION    . TONSILLECTOMY    . TUBAL LIGATION      Current Outpatient Prescriptions  Medication Sig Dispense Refill  . atorvastatin (LIPITOR) 80 MG tablet Take 80 mg by mouth at bedtime.    . digoxin (LANOXIN) 0.125 MG tablet Take 1 tablet (0.125 mg total) by mouth every other day. 30 tablet 1  . esomeprazole (NEXIUM) 40 MG capsule Take 1 capsule (40 mg total) by mouth daily at 12 noon. 30 capsule 6  . ferrous sulfate (FERROUSUL) 325 (65 FE) MG tablet Take 1 tablet (325 mg total) by mouth 2 (two) times daily with a meal. 60 tablet 2  . furosemide (LASIX) 40 MG tablet Take 20 mg by mouth every morning.     Marland Kitchen glimepiride (AMARYL) 4 MG tablet Take 4 mg by mouth daily with breakfast.    . insulin aspart (NOVOLOG) 100 UNIT/ML FlexPen Inject 10 Units into the skin daily with lunch.    . insulin detemir (LEVEMIR) 100 UNIT/ML injection Inject 30 Units into the skin at bedtime.    . metFORMIN (GLUCOPHAGE) 500 MG tablet Take 1,000 mg by mouth 2 (two) times daily with a meal.    . metoprolol tartrate (LOPRESSOR) 25 MG tablet Take 25 mg by mouth 2 (two) times daily.    Marland Kitchen  ondansetron (ZOFRAN) 4 MG tablet Take 4 mg by mouth every 8 (eight) hours as needed for nausea or vomiting.    . vitamin B-12 (CYANOCOBALAMIN) 1000 MCG tablet Take 1,000 mcg by mouth daily.    . cloNIDine (CATAPRES) 0.1 MG tablet Take 1 tablet (0.1 mg total) by mouth 2 (two) times daily. 60 tablet 11  . lisinopril (PRINIVIL,ZESTRIL) 10 MG tablet Take 1 tablet (10 mg total) by mouth daily. 90 tablet 3   No current facility-administered medications for this visit.      Allergies:   Quinapril; Venlafaxine; and Zetia [ezetimibe]   Social  History:  The patient  reports that she quit smoking about 10 years ago. Her smoking use included Cigarettes. She smoked 2.00 packs per day. She has never used smokeless tobacco. She reports that she does not drink alcohol or use drugs.   Family History:   family history includes Esophageal cancer in her brother; Kidney cancer in her father.    Review of Systems: Review of Systems  Constitutional: Negative.   Respiratory: Positive for shortness of breath.   Cardiovascular: Negative.   Gastrointestinal: Negative.   Musculoskeletal: Negative.        Gait instability  Neurological: Negative.   Psychiatric/Behavioral: Negative.   All other systems reviewed and are negative.    PHYSICAL EXAM: VS:  BP (!) 164/78 (BP Location: Left Arm, Patient Position: Sitting, Cuff Size: Normal)   Pulse 84   Ht 5\' 3"  (1.6 m)   Wt 216 lb 8 oz (98.2 kg)   BMI 38.35 kg/m  , BMI Body mass index is 38.35 kg/m. GEN: Well nourished, well developed, in no acute distress  HEENT: normal  Neck: no JVD, carotid bruits, or masses Cardiac: RRR; no murmurs, rubs, or gallops,no edema  Respiratory:  clear to auscultation bilaterally, normal work of breathing GI: soft, nontender, nondistended, + BS MS: no deformity or atrophy  Skin: warm and dry, no rash Neuro:  Strength and sensation are intact Psych: euthymic mood, full affect    Recent Labs: 11/25/2015: ALT 20 11/28/2015: BUN 25; Creatinine, Ser 1.63; Hemoglobin 7.9; Platelets 196; Potassium 4.4; Sodium 139    Lipid Panel No results found for: CHOL, HDL, LDLCALC, TRIG    Wt Readings from Last 3 Encounters:  08/05/16 216 lb 8 oz (98.2 kg)  01/26/16 227 lb 8 oz (103.2 kg)  01/22/16 227 lb 12 oz (103.3 kg)       ASSESSMENT AND PLAN:  Chronic diastolic CHF (congestive heart failure) ([place]) - Plan: EKG 12-Lead Appears relatively euvolemic on today's visit Would avoid over diuresis given underlying renal dysfunction  Morbid obesity due to excess  calories ([place]) Unable to exercise, recommended low carbohydrate diet  Shortness of breath Chronic shortness of breath on exertion, suspect secondary to obesity, deconditioning. Leads a sedentary lifestyle Recommended she increase her steps daily  Poorly controlled type 2 diabetes mellitus ([place]) Currently working with Dr. Doy Hutching and endocrinology Suspect some dietary indiscretion  Complete heart block ([place]) Pacemaker, followed by Dr. 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 EKG 12-Lead     Signed, Esmond Plants, M.D., Ph.D. 08/05/2016  [place], [place]

## 2016-08-05 NOTE — Patient Instructions (Addendum)
Medication Instructions:   Clonidine one pill twice a day for blood pressure  Ask Dr. Doy Hutching about albuterol inhaler  Labwork:  No new labs needed  Testing/Procedures:  No further testing at this time   I recommend watching educational videos on topics of interest to you at:       [email or web address]  Enter code: [email or web address]    Follow-Up: It was a pleasure seeing you in the office today. Please call us if you have new issues that need to be addressed before your next appt.  [phone]  Your physician wants you to follow-up in: 12 months.  You will receive a reminder letter in the mail two months in advance. If you don't receive a letter, please call our office to schedule the follow-up appointment.  If you need a refill on your cardiac medications before your next appointment, please call your pharmacy.

## 2016-08-16 ENCOUNTER — Ambulatory Visit (INDEPENDENT_AMBULATORY_CARE_PROVIDER_SITE_OTHER): Payer: Medicare Other | Admitting: *Deleted

## 2016-08-16 DIAGNOSIS — Z950 Presence of cardiac pacemaker: Secondary | ICD-10-CM | POA: Diagnosis not present

## 2016-08-16 DIAGNOSIS — I442 Atrioventricular block, complete: Secondary | ICD-10-CM

## 2016-08-16 LAB — CUP PACEART INCLINIC DEVICE CHECK
Battery Impedance: 134 Ohm
Battery Remaining Longevity: 133 mo
Brady Statistic AP VP Percent: 1 %
Brady Statistic AS VP Percent: 99 %
Implantable Lead Implant Date: 20151221
Implantable Lead Location: 753859
Implantable Lead Model: 5076
Implantable Pulse Generator Implant Date: 20151221
Lead Channel Impedance Value: 515 Ohm
Lead Channel Impedance Value: 615 Ohm
Lead Channel Pacing Threshold Amplitude: 0.5 V
Lead Channel Pacing Threshold Amplitude: 0.5 V
Lead Channel Sensing Intrinsic Amplitude: 2.8 mV
Lead Channel Setting Pacing Amplitude: 1.5 V
Lead Channel Setting Pacing Pulse Width: 0.4 ms
MDC IDC LEAD IMPLANT DT: 20091106
MDC IDC LEAD LOCATION: 753860
MDC IDC MSMT BATTERY VOLTAGE: 2.79 V
MDC IDC MSMT LEADCHNL RA PACING THRESHOLD PULSEWIDTH: 0.4 ms
MDC IDC MSMT LEADCHNL RV PACING THRESHOLD PULSEWIDTH: 0.4 ms
MDC IDC SESS DTM: 20171128170642
MDC IDC SET LEADCHNL RV PACING AMPLITUDE: 2.5 V
MDC IDC SET LEADCHNL RV SENSING SENSITIVITY: 2.8 mV
MDC IDC STAT BRADY AP VS PERCENT: 0 %
MDC IDC STAT BRADY AS VS PERCENT: 0 %

## 2016-08-16 NOTE — Progress Notes (Signed)
Pacemaker check in clinic. Normal device function. Thresholds, sensing, impedances consistent with previous measurements. Device programmed to maximize longevity. 7 mode switches (<0.1%), EGMs suggest frequent PACs/AT, longest 3.49min. No high ventricular rate episodes. Device programmed at appropriate safety margins. Histogram distribution appropriate for patient activity level. Device programmed to optimize intrinsic conduction. Estimated longevity 11 years. Carelink transfer requested from clinic in [place]. Patient education completed. ROV with SK/B in 6 months.

## 2016-08-16 NOTE — Patient Instructions (Signed)
Your physician wants you to follow-up in: 6 months with Dr. Caryl Comes in [place]. You will receive a reminder letter in the mail two months in advance. If you don't receive a letter, please call our office to schedule the follow-up appointment.

## 2016-08-18 ENCOUNTER — Other Ambulatory Visit: Payer: Self-pay | Admitting: Cardiovascular Disease

## 2016-12-19 ENCOUNTER — Other Ambulatory Visit: Payer: Self-pay | Admitting: Cardiovascular Disease

## 2017-04-28 ENCOUNTER — Other Ambulatory Visit: Payer: Self-pay | Admitting: Cardiovascular Disease

## 2017-05-19 IMAGING — CR DG CHEST 2V
1 series · 2 of 2 positions shown · non-contrast
Comparison: 10/30/2015

CLINICAL DATA: Short of breath for several days.

EXAM:
CHEST  2 VIEW

[Series 1: dg chest 2 view · 0.14mm/px · 2 of 2 slices shown]
[im 1/2]
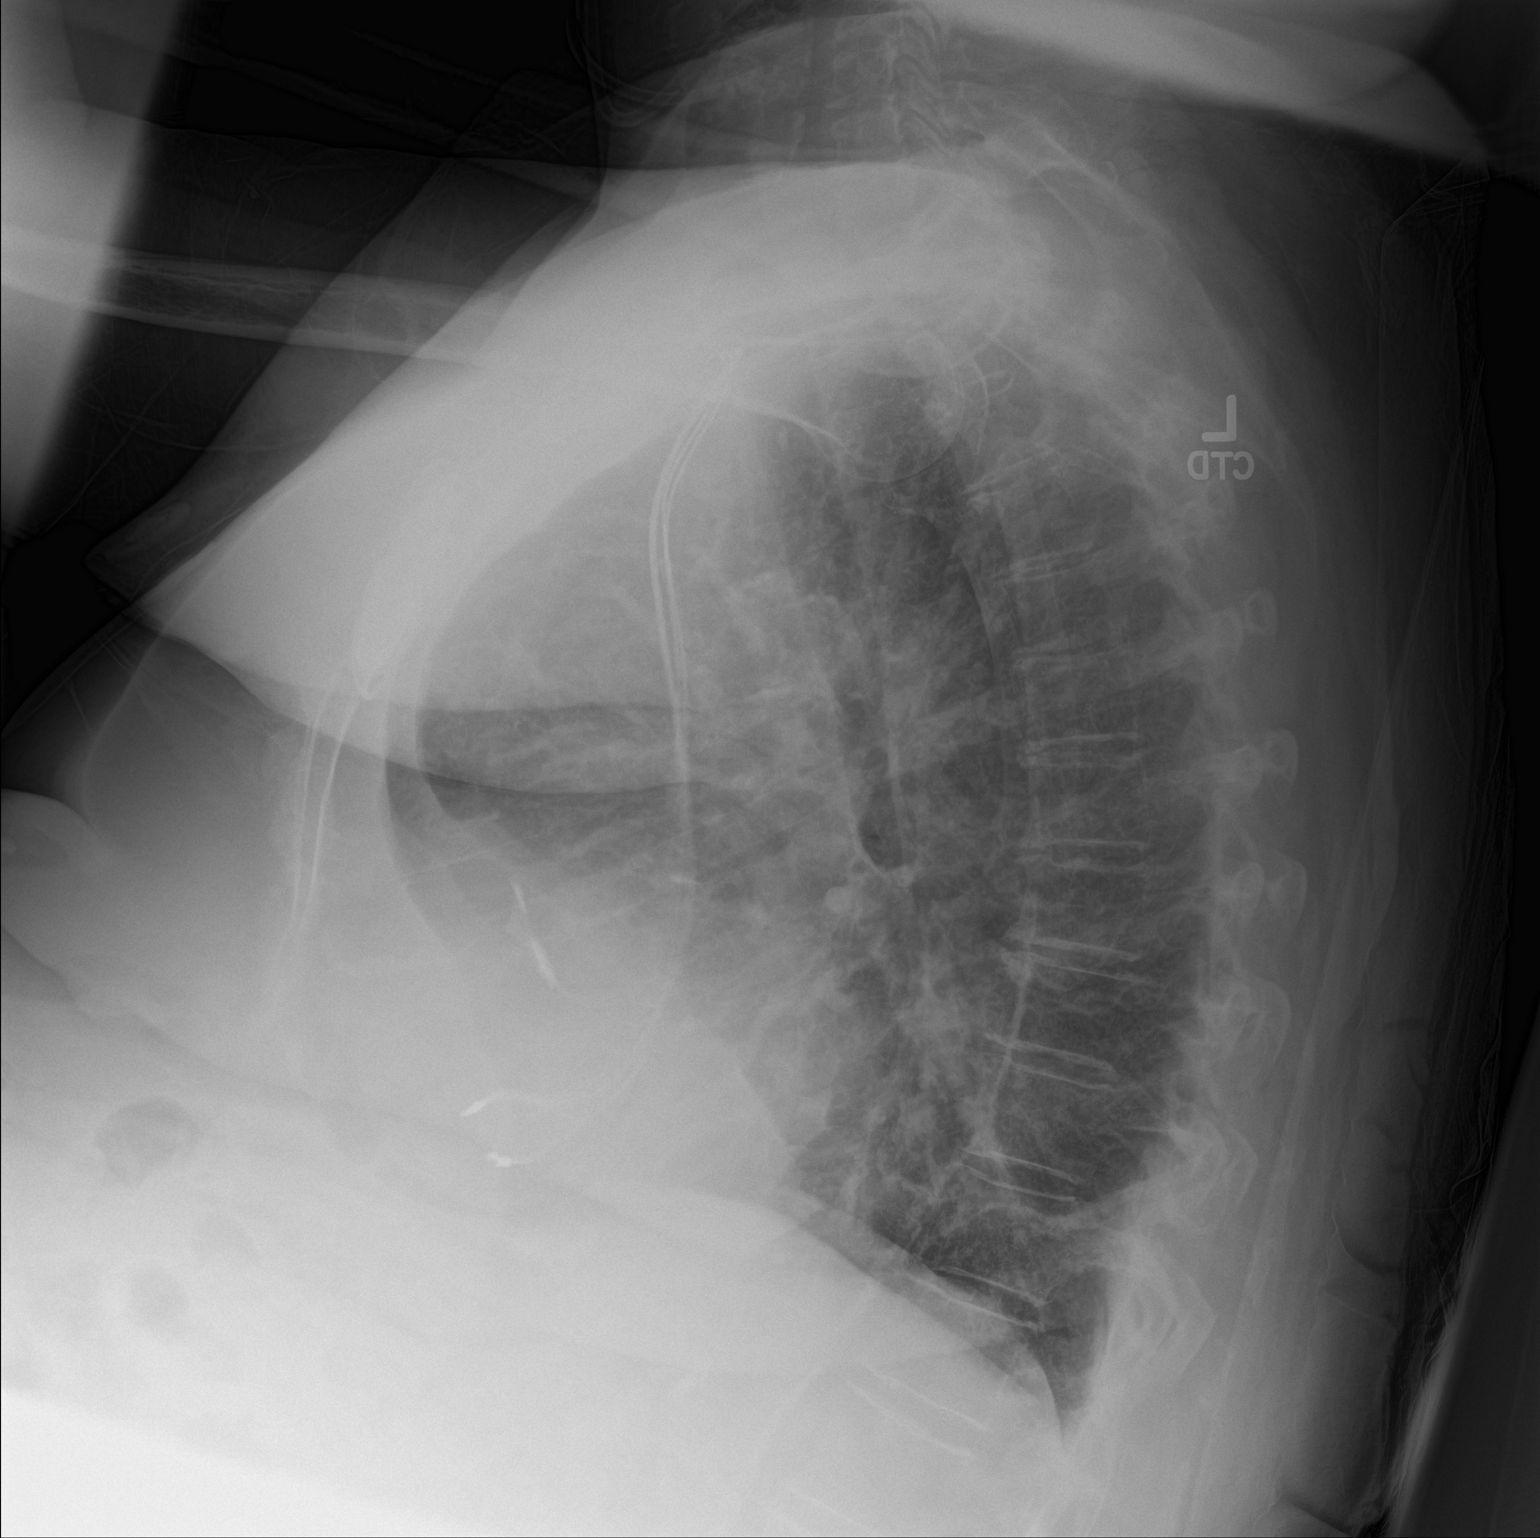
[im 2/2]
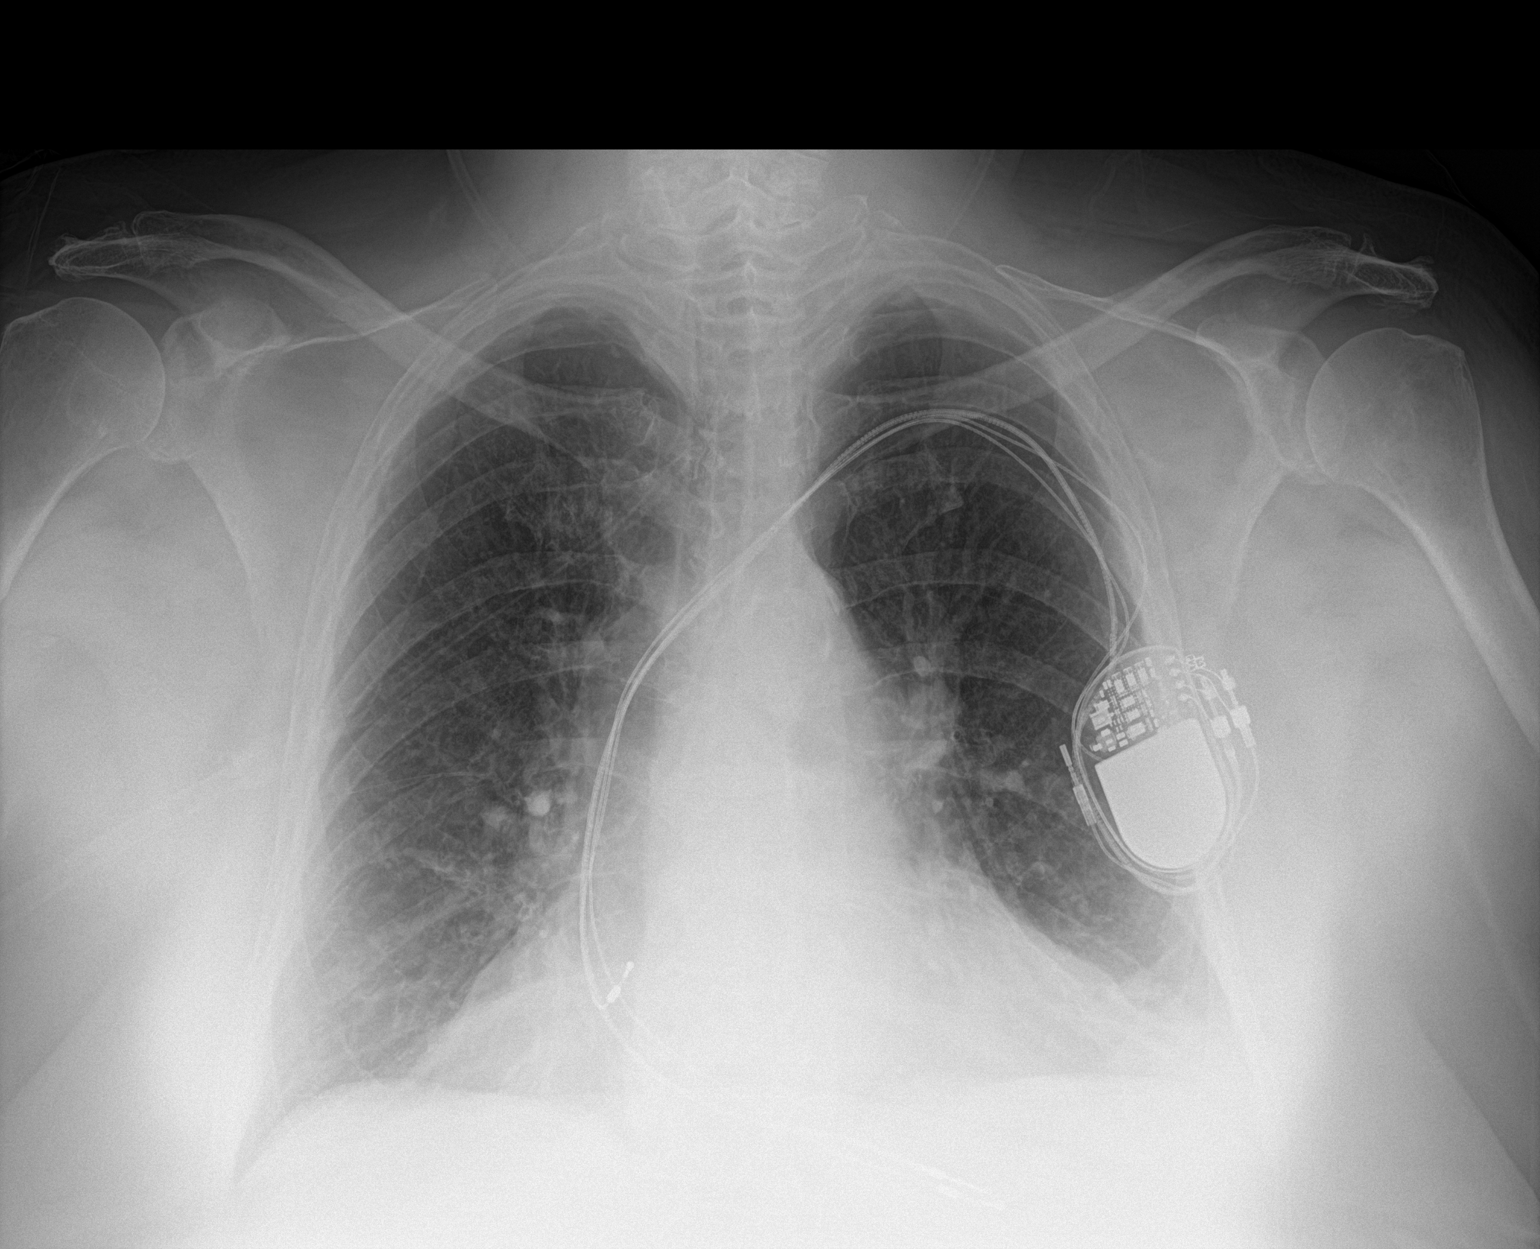

[2 of 2 positions shown; findings below may reference images not displayed]

FINDINGS: Hyperexpansion is consistent with emphysema. The lungs are clear
wiithout focal pneumonia, edema, pneumothorax or pleural effusion.
Interstitial markings are diffusely coarsened with chronic features.
The cardio pericardial silhouette is enlarged. The visualized bony
structures of the thorax are intact. Left-sided permanent pacemaker
remains in place.
IMPRESSION: No active cardiopulmonary disease.

## 2017-05-25 ENCOUNTER — Ambulatory Visit (INDEPENDENT_AMBULATORY_CARE_PROVIDER_SITE_OTHER): Payer: Medicare Other | Admitting: Cardiovascular Disease

## 2017-05-25 ENCOUNTER — Encounter: Payer: Self-pay | Admitting: Cardiovascular Disease

## 2017-05-25 VITALS — BP 170/70 | HR 71 | Ht 63.0 in | Wt 219.5 lb

## 2017-05-25 DIAGNOSIS — I442 Atrioventricular block, complete: Secondary | ICD-10-CM | POA: Diagnosis not present

## 2017-05-25 MED ORDER — CLONIDINE HCL 0.2 MG PO TABS: 0.2000 mg | ORAL_TABLET | Freq: Two times a day (BID) | ORAL | 4 refills | Status: DC

## 2017-05-25 MED ORDER — ESOMEPRAZOLE MAGNESIUM 40 MG PO CPDR: 40.0000 mg | DELAYED_RELEASE_CAPSULE | Freq: Every day | ORAL | 3 refills | Status: DC

## 2017-05-25 NOTE — Patient Instructions (Addendum)
Medication Instructions:   Please increase the clonidine up to 0.2 mg twice a day  Labwork:  No new labs needed  Testing/Procedures:  No further testing at this time   Follow-Up: It was a pleasure seeing you in the office today. Please call us if you have new issues that need to be addressed before your next appt.  [phone]  Your physician wants you to follow-up in: 12 months.  You will receive a reminder letter in the mail two months in advance. If you don't receive a letter, please call our office to schedule the follow-up appointment.  If you need a refill on your cardiac medications before your next appointment, please call your pharmacy.

## 2017-05-25 NOTE — Progress Notes (Signed)
Cardiology Office Note  Date:  05/25/2017   ID:  Teresa Mercado, DOB 1946-02-12, MRN [id number]  PCP:  Teresa Crouch, MD   Chief Complaint  Patient presents with  . other    6 month follow up. Meds reviewed by the pt. verbally. "doing well." Pt. c/o LE edema.     HPI:  Teresa Mercado is a 71 y.o. female with a known history of  diastolic CHF,  COPD,  diabetes mellitus,  CKD stage III with baseline creatinine around 1.6 to 1.8,  complete heart block with a pacemaker (initially presenting with syncope),   GI bleed, hematocrit 6.9 requiring transfusion 2 whopresents today for routine follow-up of her chronic diastolic CHF  Lab work reviewed with her in detail CR 1.5, stable HCT 33 HBA1C 8.4 tiotal chol 155, LDL 82  In general she reports that she Feels well,  Some leg swelling, nonpitting  takes Lasix 20 mg daily SOB when walking,Stable Trace ankle edema, wears compression hose in the winter, not in the summer  No regular exercise program, in fact is very sedentary Some leg weakness  Seen by Dr. Caryl Comes May 2017, device interrogated  Echocardiogram January 2017 Ejection fraction greater than 55%, No significant valve disease  Weight down 219, down from 227, previously from 247  EKG on today's visit shows paced rhythm rate 71 bpm  Other past medical history reviewed Previous EGD showing gastritis, erosions . Previously had diarrhea  EKG dated 11/25/2015 shows ventricular paced rhythm  Other past medical history   pacer placed in the past for sick sinus syndrome, reportedly had 10 second pause per the patient   PMH:   has a past medical history of CHF (congestive heart failure) ([place]); COPD (chronic obstructive pulmonary disease) ([place]); Diabetes mellitus without complication ([place]); Renal disorder; and Syncope.  PSH:    Past Surgical History:  Procedure Laterality Date  . APPENDECTOMY    . ESOPHAGOGASTRODUODENOSCOPY N/A 11/27/2015   Procedure:  ESOPHAGOGASTRODUODENOSCOPY (EGD);  Surgeon: Lollie Sails, MD;  Location: [facility] ENDOSCOPY;  Service: Endoscopy;  Laterality: N/A;  . PACEMAKER INSERTION    . TONSILLECTOMY    . TUBAL LIGATION      Current Outpatient Prescriptions  Medication Sig Dispense Refill  . atorvastatin (LIPITOR) 80 MG tablet Take 80 mg by mouth daily at 6 PM.     . cloNIDine (CATAPRES) 0.1 MG tablet Take 1 tablet (0.1 mg total) by mouth 2 (two) times daily. 60 tablet 11  . digoxin (LANOXIN) 0.125 MG tablet Take 1 tablet (0.125 mg total) by mouth every other day. 30 tablet 1  . esomeprazole (NEXIUM) 40 MG capsule take 1 capsule by mouth once daily 30 capsule 3  . ferrous sulfate (FERROUSUL) 325 (65 FE) MG tablet Take 1 tablet (325 mg total) by mouth 2 (two) times daily with a meal. (Patient taking differently: Take 325 mg by mouth daily with breakfast. ) 60 tablet 2  . furosemide (LASIX) 40 MG tablet Take 20 mg by mouth every morning.     Marland Kitchen glimepiride (AMARYL) 4 MG tablet Take 4 mg by mouth daily with breakfast.    . insulin aspart (NOVOLOG) 100 UNIT/ML FlexPen Inject 16 Units into the skin daily with lunch.     . insulin detemir (LEVEMIR) 100 UNIT/ML injection Inject 40 Units into the skin at bedtime.     Marland Kitchen lisinopril (PRINIVIL,ZESTRIL) 10 MG tablet Take 1 tablet (10 mg total) by mouth daily. 90 tablet 3  . metFORMIN (GLUCOPHAGE) 500 MG  tablet Take 500 mg by mouth 2 (two) times daily with a meal.     . metoprolol tartrate (LOPRESSOR) 25 MG tablet Take 25 mg by mouth 2 (two) times daily.    . ondansetron (ZOFRAN) 4 MG tablet Take 4 mg by mouth every 8 (eight) hours as needed for nausea or vomiting.    . vitamin B-12 (CYANOCOBALAMIN) 1000 MCG tablet Take 1,000 mcg by mouth daily.     No current facility-administered medications for this visit.      Allergies:   Quinapril; Venlafaxine; and Zetia [ezetimibe]   Social History:  The patient  reports that she quit smoking about 11 years ago. Her smoking use included  Cigarettes. She smoked 2.00 packs per day. She has never used smokeless tobacco. She reports that she does not drink alcohol or use drugs.   Family History:   family history includes Esophageal cancer in her brother; Kidney cancer in her father.    Review of Systems: Review of Systems  Constitutional: Negative.   Respiratory: Positive for shortness of breath.   Cardiovascular: Positive for leg swelling.  Gastrointestinal: Negative.   Musculoskeletal: Negative.        Gait instability  Neurological: Negative.   Psychiatric/Behavioral: Negative.   All other systems reviewed and are negative.    PHYSICAL EXAM: VS:  BP (!) 170/70 (BP Location: Right Arm, Patient Position: Sitting, Cuff Size: Normal)   Pulse 71   Ht 5\' 3"  (1.6 m)   Wt 219 lb 8 oz (99.6 kg)   BMI 38.88 kg/m  , BMI Body mass index is 38.88 kg/m. GEN: Well nourished, well developed, in no acute distress , obese HEENT: normal  Neck: no JVD, carotid bruits, or masses Cardiac: RRR; no murmurs, rubs, or gallops,nonpitting leg edema  Respiratory:  clear to auscultation bilaterally, normal work of breathing GI: soft, nontender, nondistended, + BS MS: no deformity or atrophy  Skin: warm and dry, no rash Neuro:  Strength and sensation are intact Psych: euthymic mood, full affect    Recent Labs: No results found for requested labs within last 8760 hours.    Lipid Panel No results found for: CHOL, HDL, LDLCALC, TRIG    Wt Readings from Last 3 Encounters:  05/25/17 219 lb 8 oz (99.6 kg)  08/05/16 216 lb 8 oz (98.2 kg)  01/26/16 227 lb 8 oz (103.2 kg)       ASSESSMENT AND PLAN:   Chronic diastolic CHF (congestive heart failure) ([place]) - Plan: EKG 12-Lead Appears relatively euvolemic on today's visit Continue 29 g Lasix daily  Morbid obesity due to excess calories (HCC) Weight slowly trending downward, stable Recommended regular walking program  Shortness of breath Chronic shortness of breath on  exertion, suspect secondary to obesity, deconditioning. sedentary lifestyle  Poorly controlled type 2 diabetes mellitus ([place]) Currently working with Dr. Doy Hutching and endocrinology Suspect some dietary indiscretion  Complete heart block ([place]) Pacemaker, followed by Dr. 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  Signed, Esmond Plants, M.D., Ph.D. 05/25/2017  [place], [place]

## 2017-07-04 ENCOUNTER — Ambulatory Visit (INDEPENDENT_AMBULATORY_CARE_PROVIDER_SITE_OTHER): Payer: Medicare Other | Admitting: Internal Medicine

## 2017-07-04 ENCOUNTER — Encounter: Payer: Self-pay | Admitting: Internal Medicine

## 2017-07-04 VITALS — BP 120/60 | HR 65 | Ht 63.0 in | Wt 215.5 lb

## 2017-07-04 DIAGNOSIS — E1165 Type 2 diabetes mellitus with hyperglycemia: Secondary | ICD-10-CM | POA: Diagnosis not present

## 2017-07-04 DIAGNOSIS — I442 Atrioventricular block, complete: Secondary | ICD-10-CM

## 2017-07-04 DIAGNOSIS — R600 Localized edema: Secondary | ICD-10-CM

## 2017-07-04 DIAGNOSIS — I5032 Chronic diastolic (congestive) heart failure: Secondary | ICD-10-CM

## 2017-07-04 DIAGNOSIS — Z950 Presence of cardiac pacemaker: Secondary | ICD-10-CM

## 2017-07-04 NOTE — Progress Notes (Signed)
Patient Care Team: Idelle Crouch, MD as PCP - General (Internal Medicine) Minna Merritts, MD as Consulting Physician (Cardiology)   HPI  Teresa Mercado is a 71 y.o. 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 She underwent generator replacement 12/15.  She continues with modest dyspnea on exertion as well as peripheral edema.  LVEF 1/17 by echo was normal with moderate LAE.  Dietary history reveals copious fluid intake and moderate sodium intake  Has a history of GI bleeding is on iron replacement    Date Cr K Hgb Dig   3/17      0.8   8/18 1.5 4.7 10.8       Records and Results Reviewed records andlabs  Past Medical History:  Diagnosis Date  . CHF (congestive heart failure) (HCC)    Diastolic, last ECHO from Jan 2017- normal EF 55%  . COPD (chronic obstructive pulmonary disease) (HCC)    Not on home o2  . Diabetes mellitus without complication ([place])    On Levemir  . Renal disorder    CKD stage 3  . Syncope    s/p pacemaker    Past Surgical History:  Procedure Laterality Date  . APPENDECTOMY    . ESOPHAGOGASTRODUODENOSCOPY N/A 11/27/2015   Procedure: ESOPHAGOGASTRODUODENOSCOPY (EGD);  Surgeon: Lollie Sails, MD;  Location: [facility] ENDOSCOPY;  Service: Endoscopy;  Laterality: N/A;  . PACEMAKER INSERTION    . TONSILLECTOMY    . TUBAL LIGATION      Current Outpatient Prescriptions  Medication Sig Dispense Refill  . atorvastatin (LIPITOR) 80 MG tablet Take 80 mg by mouth daily at 6 PM.     . cloNIDine (CATAPRES) 0.2 MG tablet Take 1 tablet (0.2 mg total) by mouth 2 (two) times daily. 180 tablet 4  . digoxin (LANOXIN) 0.125 MG tablet Take 1 tablet (0.125 mg total) by mouth every other day. 30 tablet 1  . esomeprazole (NEXIUM) 40 MG capsule Take 1 capsule (40 mg total) by mouth daily. 90 capsule 3  . ferrous sulfate (FERROUSUL) 325 (65 FE) MG tablet Take 1 tablet (325 mg total) by mouth 2  (two) times daily with a meal. (Patient taking differently: Take 325 mg by mouth daily with breakfast. ) 60 tablet 2  . furosemide (LASIX) 40 MG tablet Take 20 mg by mouth every morning.     Marland Kitchen glimepiride (AMARYL) 4 MG tablet Take 4 mg by mouth daily with breakfast.    . insulin aspart (NOVOLOG) 100 UNIT/ML FlexPen Inject 16 Units into the skin daily with lunch.     . insulin detemir (LEVEMIR) 100 UNIT/ML injection Inject 40 Units into the skin at bedtime.     Marland Kitchen lisinopril (PRINIVIL,ZESTRIL) 10 MG tablet Take 1 tablet (10 mg total) by mouth daily. 90 tablet 3  . metFORMIN (GLUCOPHAGE) 500 MG tablet Take 500 mg by mouth 2 (two) times daily with a meal.     . metoprolol tartrate (LOPRESSOR) 25 MG tablet Take 25 mg by mouth 2 (two) times daily.    . ondansetron (ZOFRAN) 4 MG tablet Take 4 mg by mouth every 8 (eight) hours as needed for nausea or vomiting.    . vitamin B-12 (CYANOCOBALAMIN) 1000 MCG tablet Take 1,000 mcg by mouth daily.     No current facility-administered medications for this visit.     Allergies  Allergen Reactions  . Quinapril Other (See Comments)  Reaction:  Elevated potassium   . Venlafaxine Other (See Comments)    Reaction:  GI upset   . Zetia [Ezetimibe] Nausea And Vomiting      Review of Systems negative except from HPI and PMH  Physical Exam BP 120/60 (BP Location: Left Arm, Patient Position: Sitting, Cuff Size: Large)   Pulse 65   Ht 5\' 3"  (1.6 m)   Wt 215 lb 8 oz (97.8 kg)   BMI 38.17 kg/m  Well developed and well nourished in no acute distress HENT normal E scleral and icterus clear Neck Supple JVP flat; carotids brisk and full Clear to ausculation Regular rate and rhythm, no murmurs gallops or rub Soft with active bowel sounds No clubbing cyanosis  1-2+ Edema Alert and oriented, grossly normal motor and sensory function Skin Warm and Dry  ECG demonstrates P synchronous pacing at 65  Assessment and  Plan  Pacemaker  [employer] The patient's  device was interrogated.  The information was reviewed. No changes were made in the programming.     Syncope  Renal Insufficiency w DM  Complete heart block  DM   HFpEF  With volume overload, we have reviewed dietary opportunities for sodium and fluid restriction. She'll see how she does. Would like to try this rather than increasing her diuretics  No interval syncope  We'll check a digoxin level although, if her LV function is normal I would be inclined to stop it given the paucity of data in HFpEF  Last Hgb A1c  3/17 was 6.6 /    Current medicines are reviewed at length with the patient today .  The patient does not  have concerns regarding medicines./

## 2017-07-04 NOTE — Patient Instructions (Signed)
Medication Instructions:  Your physician recommends that you continue on your current medications as directed. Please refer to the Current Medication list given to you today.   Labwork: Digoxin level and BMET today  Testing/Procedures: none  Follow-Up: Your physician wants you to follow-up in: 6 months in the office for a device check. You will receive a reminder letter in the mail two months in advance. If you don't receive a letter, please call our office to schedule the follow-up appointment.  Your physician wants you to follow-up in: 1 year with Dr. Caryl Comes.  You will receive a reminder letter in the mail two months in advance. If you don't receive a letter, please call our office to schedule the follow-up appointment.   Any Other Special Instructions Will Be Listed Below (If Applicable).     If you need a refill on your cardiac medications before your next appointment, please call your pharmacy.

## 2017-07-05 LAB — DIGOXIN, RANDOM, SERUM: Digoxin, Random, Serum: 1.6 ng/mL

## 2017-07-05 LAB — BASIC METABOLIC PANEL
BUN / CREAT RATIO: 19 (ref 12–28)
BUN: 35 mg/dL — ABNORMAL HIGH (ref 8–27)
CO2: 25 mmol/L (ref 20–29)
CREATININE: 1.8 mg/dL — AB (ref 0.57–1.00)
Calcium: 9.3 mg/dL (ref 8.7–10.3)
Chloride: 101 mmol/L (ref 96–106)
GFR calc Af Amer: 32 mL/min/{1.73_m2} — ABNORMAL LOW (ref >59)
GFR, EST NON AFRICAN AMERICAN: 28 mL/min/{1.73_m2} — AB (ref >59)
GLUCOSE: 187 mg/dL — AB (ref 65–99)
Potassium: 5.3 mmol/L — ABNORMAL HIGH (ref 3.5–5.2)
SODIUM: 141 mmol/L (ref 134–144)

## 2017-07-05 LAB — HEMOGLOBIN A1C
Est. average glucose Bld gHb Est-mCnc: 180 mg/dL
Hgb A1c MFr Bld: 7.9 % — ABNORMAL HIGH (ref 4.8–5.6)

## 2017-07-10 ENCOUNTER — Telehealth: Payer: Self-pay

## 2017-07-10 LAB — CUP PACEART INCLINIC DEVICE CHECK
Battery Impedance: 181 Ohm
Brady Statistic AP VP Percent: 0 %
Brady Statistic AP VS Percent: 0 %
Brady Statistic AS VS Percent: 0 %
Date Time Interrogation Session: 20181016161014
Implantable Lead Implant Date: 20091106
Implantable Lead Implant Date: 20151221
Implantable Lead Location: 753859
Implantable Lead Model: 5076
Lead Channel Impedance Value: 445 Ohm
Lead Channel Impedance Value: 571 Ohm
Lead Channel Pacing Threshold Amplitude: 0.75 V
Lead Channel Pacing Threshold Pulse Width: 0.4 ms
Lead Channel Pacing Threshold Pulse Width: 0.4 ms
Lead Channel Setting Pacing Amplitude: 1.5 V
Lead Channel Setting Sensing Sensitivity: 2.8 mV
MDC IDC LEAD LOCATION: 753860
MDC IDC MSMT BATTERY REMAINING LONGEVITY: 122 mo
MDC IDC MSMT BATTERY VOLTAGE: 2.79 V
MDC IDC MSMT LEADCHNL RA PACING THRESHOLD AMPLITUDE: 0.5 V
MDC IDC MSMT LEADCHNL RA SENSING INTR AMPL: 2 mV
MDC IDC PG IMPLANT DT: 20151221
MDC IDC SET LEADCHNL RV PACING AMPLITUDE: 2.5 V
MDC IDC SET LEADCHNL RV PACING PULSEWIDTH: 0.4 ms
MDC IDC STAT BRADY AS VP PERCENT: 100 %

## 2017-07-10 NOTE — Telephone Encounter (Signed)
Pt is aware and agreeable to abnormal resutls. She informed me that she did receive Dr. Olin Pia message about stopping digoxin which she is agreeable to. She is agreeable to going to [place] to having repeat BMET on Wednesday. She is also agreeable to following up with Dr. Doy Hutching (PCP) about worsening Kidney function and possible renal referral. I spoke with [facility] clinic and informed Dr. Doy Hutching nurse about abnormal labs and repeat BMET. Patient would prefer to have labs done there. Also spoke with nurse about moving patients follow up sooner with Dr. Doy Hutching then was is scheduled in November. She informed me she would work on it as Dr. Doy Hutching was not in the office today and would call me back later today. I called and relayed all information to patient. We are all agreeable to plan.

## 2017-07-11 ENCOUNTER — Other Ambulatory Visit: Payer: Self-pay

## 2017-07-11 DIAGNOSIS — I5032 Chronic diastolic (congestive) heart failure: Secondary | ICD-10-CM

## 2017-07-11 DIAGNOSIS — I442 Atrioventricular block, complete: Secondary | ICD-10-CM

## 2017-07-20 ENCOUNTER — Emergency Department (HOSPITAL_COMMUNITY): Payer: Medicare Other

## 2017-07-20 ENCOUNTER — Encounter (HOSPITAL_COMMUNITY): Payer: Self-pay | Admitting: Emergency Medicine

## 2017-07-20 ENCOUNTER — Inpatient Hospital Stay (HOSPITAL_COMMUNITY)
Admission: EM | Admit: 2017-07-20 | Discharge: 2017-07-23 | DRG: 281 | Disposition: A | Discharge status: Home / Self Care | Payer: Medicare Other | Attending: Internal Medicine | Admitting: Internal Medicine

## 2017-07-20 DIAGNOSIS — I428 Other cardiomyopathies: Secondary | ICD-10-CM | POA: Diagnosis present

## 2017-07-20 DIAGNOSIS — Z7982 Long term (current) use of aspirin: Secondary | ICD-10-CM | POA: Diagnosis not present

## 2017-07-20 DIAGNOSIS — Z888 Allergy status to other drugs, medicaments and biological substances status: Secondary | ICD-10-CM

## 2017-07-20 DIAGNOSIS — I214 Non-ST elevation (NSTEMI) myocardial infarction: Principal | ICD-10-CM | POA: Diagnosis present

## 2017-07-20 DIAGNOSIS — E669 Obesity, unspecified: Secondary | ICD-10-CM | POA: Diagnosis present

## 2017-07-20 DIAGNOSIS — N183 Chronic kidney disease, stage 3 unspecified: Secondary | ICD-10-CM

## 2017-07-20 DIAGNOSIS — E1165 Type 2 diabetes mellitus with hyperglycemia: Secondary | ICD-10-CM | POA: Diagnosis present

## 2017-07-20 DIAGNOSIS — E1122 Type 2 diabetes mellitus with diabetic chronic kidney disease: Secondary | ICD-10-CM | POA: Diagnosis present

## 2017-07-20 DIAGNOSIS — I2510 Atherosclerotic heart disease of native coronary artery without angina pectoris: Secondary | ICD-10-CM | POA: Diagnosis not present

## 2017-07-20 DIAGNOSIS — Z8051 Family history of malignant neoplasm of kidney: Secondary | ICD-10-CM

## 2017-07-20 DIAGNOSIS — Z800 Family history of malignant neoplasm of digestive organs: Secondary | ICD-10-CM

## 2017-07-20 DIAGNOSIS — R079 Chest pain, unspecified: Secondary | ICD-10-CM | POA: Diagnosis present

## 2017-07-20 DIAGNOSIS — Z950 Presence of cardiac pacemaker: Secondary | ICD-10-CM

## 2017-07-20 DIAGNOSIS — I5043 Acute on chronic combined systolic (congestive) and diastolic (congestive) heart failure: Secondary | ICD-10-CM | POA: Diagnosis not present

## 2017-07-20 DIAGNOSIS — Z6836 Body mass index (BMI) 36.0-36.9, adult: Secondary | ICD-10-CM | POA: Diagnosis not present

## 2017-07-20 DIAGNOSIS — Z87891 Personal history of nicotine dependence: Secondary | ICD-10-CM | POA: Diagnosis not present

## 2017-07-20 DIAGNOSIS — I5032 Chronic diastolic (congestive) heart failure: Secondary | ICD-10-CM | POA: Diagnosis present

## 2017-07-20 DIAGNOSIS — J449 Chronic obstructive pulmonary disease, unspecified: Secondary | ICD-10-CM | POA: Diagnosis present

## 2017-07-20 DIAGNOSIS — Z794 Long term (current) use of insulin: Secondary | ICD-10-CM

## 2017-07-20 DIAGNOSIS — I5021 Acute systolic (congestive) heart failure: Secondary | ICD-10-CM | POA: Diagnosis not present

## 2017-07-20 HISTORY — DX: Other cardiomyopathies: I42.8

## 2017-07-20 HISTORY — DX: Atherosclerotic heart disease of native coronary artery without angina pectoris: I25.10

## 2017-07-20 LAB — BASIC METABOLIC PANEL
Anion gap: 10 (ref 5–15)
BUN: 38 mg/dL — AB (ref 6–20)
CO2: 26 mmol/L (ref 22–32)
CREATININE: 1.8 mg/dL — AB (ref 0.44–1.00)
Calcium: 9.1 mg/dL (ref 8.9–10.3)
Chloride: 101 mmol/L (ref 101–111)
GFR calc Af Amer: 31 mL/min — ABNORMAL LOW (ref >60)
GFR, EST NON AFRICAN AMERICAN: 27 mL/min — AB (ref >60)
GLUCOSE: 133 mg/dL — AB (ref 65–99)
Potassium: 4.4 mmol/L (ref 3.5–5.1)
SODIUM: 137 mmol/L (ref 135–145)

## 2017-07-20 LAB — CBC
HCT: 33.3 % — ABNORMAL LOW (ref 36.0–46.0)
Hemoglobin: 10.5 g/dL — ABNORMAL LOW (ref 12.0–15.0)
MCH: 28.2 pg (ref 26.0–34.0)
MCHC: 31.5 g/dL (ref 30.0–36.0)
MCV: 89.5 fL (ref 78.0–100.0)
PLATELETS: 298 10*3/uL (ref 150–400)
RBC: 3.72 MIL/uL — ABNORMAL LOW (ref 3.87–5.11)
RDW: 15.5 % (ref 11.5–15.5)
WBC: 10.5 10*3/uL (ref 4.0–10.5)

## 2017-07-20 LAB — TROPONIN I
TROPONIN I: 0.58 ng/mL — AB (ref <0.03)
Troponin I: 0.04 ng/mL (ref <0.03)
Troponin I: 1.5 ng/mL (ref <0.03)
Troponin I: 2.66 ng/mL (ref <0.03)

## 2017-07-20 LAB — HEMOGLOBIN A1C
Hgb A1c MFr Bld: 7.5 % — ABNORMAL HIGH (ref 4.8–5.6)
MEAN PLASMA GLUCOSE: 168.55 mg/dL

## 2017-07-20 LAB — PROTIME-INR
INR: 0.94
PROTHROMBIN TIME: 12.5 s (ref 11.4–15.2)

## 2017-07-20 LAB — DIGOXIN LEVEL: DIGOXIN LVL: 0.8 ng/mL (ref 0.8–2.0)

## 2017-07-20 LAB — APTT: aPTT: 26 seconds (ref 24–36)

## 2017-07-20 LAB — GLUCOSE, CAPILLARY: Glucose-Capillary: 124 mg/dL — ABNORMAL HIGH (ref 65–99)

## 2017-07-20 LAB — HEPARIN LEVEL (UNFRACTIONATED): Heparin Unfractionated: 0.11 IU/mL — ABNORMAL LOW (ref 0.30–0.70)

## 2017-07-20 LAB — BRAIN NATRIURETIC PEPTIDE: B Natriuretic Peptide: 122 pg/mL — ABNORMAL HIGH (ref 0.0–100.0)

## 2017-07-20 MED ORDER — INSULIN DETEMIR 100 UNIT/ML ~~LOC~~ SOLN
10.0000 [IU] | Freq: Once | SUBCUTANEOUS | Status: AC
  Administered 2017-07-20: 10 [IU] via SUBCUTANEOUS
  Filled 2017-07-20: qty 0.1

## 2017-07-20 MED ORDER — ATORVASTATIN CALCIUM 80 MG PO TABS
80.0000 mg | ORAL_TABLET | Freq: Every day | ORAL | Status: DC
  Administered 2017-07-20 – 2017-07-23 (×4): 80 mg via ORAL
  Filled 2017-07-20 (×2): qty 2
  Filled 2017-07-20 (×2): qty 1

## 2017-07-20 MED ORDER — INSULIN DETEMIR 100 UNIT/ML ~~LOC~~ SOLN
20.0000 [IU] | Freq: Every day | SUBCUTANEOUS | Status: DC
  Administered 2017-07-20: 10 [IU] via SUBCUTANEOUS
  Administered 2017-07-21 – 2017-07-22 (×2): 20 [IU] via SUBCUTANEOUS
  Filled 2017-07-20 (×6): qty 0.2

## 2017-07-20 MED ORDER — FUROSEMIDE 10 MG/ML IJ SOLN
40.0000 mg | Freq: Once | INTRAMUSCULAR | Status: AC
  Administered 2017-07-20: 40 mg via INTRAVENOUS
  Filled 2017-07-20: qty 4

## 2017-07-20 MED ORDER — METOPROLOL TARTRATE 25 MG PO TABS
25.0000 mg | ORAL_TABLET | Freq: Two times a day (BID) | ORAL | Status: DC
  Administered 2017-07-20 – 2017-07-21 (×3): 25 mg via ORAL
  Filled 2017-07-20 (×3): qty 1

## 2017-07-20 MED ORDER — ORAL CARE MOUTH RINSE
15.0000 mL | Freq: Two times a day (BID) | OROMUCOSAL | Status: DC
  Administered 2017-07-20 – 2017-07-23 (×6): 15 mL via OROMUCOSAL

## 2017-07-20 MED ORDER — ONDANSETRON HCL 4 MG/2ML IJ SOLN: 4.0000 mg | Freq: Four times a day (QID) | INTRAMUSCULAR | Status: DC | PRN

## 2017-07-20 MED ORDER — CLONIDINE HCL 0.1 MG PO TABS
0.1000 mg | ORAL_TABLET | Freq: Two times a day (BID) | ORAL | Status: DC
  Administered 2017-07-20 – 2017-07-23 (×6): 0.1 mg via ORAL
  Filled 2017-07-20 (×8): qty 1

## 2017-07-20 MED ORDER — INSULIN ASPART 100 UNIT/ML ~~LOC~~ SOLN
0.0000 [IU] | Freq: Every day | SUBCUTANEOUS | Status: DC
  Administered 2017-07-22: 3 [IU] via SUBCUTANEOUS

## 2017-07-20 MED ORDER — HEPARIN SODIUM (PORCINE) 5000 UNIT/ML IJ SOLN: 5000.0000 [IU] | Freq: Three times a day (TID) | INTRAMUSCULAR | Status: DC

## 2017-07-20 MED ORDER — HEPARIN BOLUS VIA INFUSION
2000.0000 [IU] | Freq: Once | INTRAVENOUS | Status: AC
  Administered 2017-07-20: 2000 [IU] via INTRAVENOUS
  Filled 2017-07-20: qty 2000

## 2017-07-20 MED ORDER — PANTOPRAZOLE SODIUM 40 MG PO TBEC
40.0000 mg | DELAYED_RELEASE_TABLET | Freq: Every day | ORAL | Status: DC
  Administered 2017-07-20 – 2017-07-23 (×4): 40 mg via ORAL
  Filled 2017-07-20 (×4): qty 1

## 2017-07-20 MED ORDER — HEPARIN BOLUS VIA INFUSION
4000.0000 [IU] | Freq: Once | INTRAVENOUS | Status: AC
  Administered 2017-07-20: 4000 [IU] via INTRAVENOUS
  Filled 2017-07-20: qty 4000

## 2017-07-20 MED ORDER — ACETAMINOPHEN 325 MG PO TABS
650.0000 mg | ORAL_TABLET | ORAL | Status: DC | PRN
  Administered 2017-07-21: 650 mg via ORAL
  Filled 2017-07-20: qty 2

## 2017-07-20 MED ORDER — ALBUTEROL SULFATE (2.5 MG/3ML) 0.083% IN NEBU: 2.5000 mg | INHALATION_SOLUTION | Freq: Four times a day (QID) | RESPIRATORY_TRACT | Status: DC | PRN

## 2017-07-20 MED ORDER — HEPARIN (PORCINE) IN NACL 100-0.45 UNIT/ML-% IJ SOLN
1100.0000 [IU]/h | INTRAMUSCULAR | Status: DC
  Administered 2017-07-20: 900 [IU]/h via INTRAVENOUS
  Filled 2017-07-20 (×2): qty 250

## 2017-07-20 MED ORDER — ASPIRIN 81 MG PO CHEW
324.0000 mg | CHEWABLE_TABLET | Freq: Once | ORAL | Status: AC
  Administered 2017-07-20: 324 mg via ORAL
  Filled 2017-07-20: qty 4

## 2017-07-20 MED ORDER — INSULIN ASPART 100 UNIT/ML ~~LOC~~ SOLN
0.0000 [IU] | Freq: Three times a day (TID) | SUBCUTANEOUS | Status: DC
  Administered 2017-07-21: 1 [IU] via SUBCUTANEOUS
  Administered 2017-07-21 – 2017-07-22 (×2): 3 [IU] via SUBCUTANEOUS
  Administered 2017-07-22: 07:00:00 2 [IU] via SUBCUTANEOUS
  Administered 2017-07-22: 3 [IU] via SUBCUTANEOUS
  Administered 2017-07-23: 2 [IU] via SUBCUTANEOUS

## 2017-07-20 NOTE — ED Notes (Signed)
CRITICAL VALUE ALERT  Critical Value:  Trop 0.04  Date & Time Notied:  3668, 07/20/17  Provider Notified: Dr. Winfred Leeds  Orders Received/Actions taken: no new orders

## 2017-07-20 NOTE — H&P (Signed)
History and Physical    [name] GBT:[id number] DOB: [birth year] DOA: 07/20/2017  PCP: Idelle Crouch, MD  Patient coming from:  Home  Chief Complaint: Chest pain   HPI: Teresa Mercado is a 71 y.o. female with medical history significant of CHF, COPD, DM, CKD Stage III and s/p pacemaker placement that presents with chest pain that began this morning when she was laying in bed.  Per patient she developed a chest pain that then spread around to her back and between her shoulder blades and reports that she also had some pain in her left arm.  She arrived to the ED and reports that nothing made it better and nothing made the pain worse.  While in the ED the pain disappeared.  She denied sweating, shortness of breath, nausea, vomiting with the pain.  Says she has not had pain like this before.  At one point she reports the pain felt somewhat like a heaviness on her chest.  Sees Dr. Caryl Comes, EP, for management of pacemaker and has another cardiologist.  Denies ever having chest pain on exertion.  Denies dyspnea with activity or at rest.    ED Course: Patient was seen by Dr. Winfred Leeds and given aspirin.  EKG showing paced rhythmn.  Troponin minimally elevated at 0.04.  Patient was pain free when [facility] asked to admit to cycle troponins.  Blood pressures were measured in each arm and were almost identical.  As patient has chronic renal insufficiency was not a candidate for CTA.  Due to pacemaker she was not a candidate for MRI.    Review of Systems: As per HPI otherwise 10 point review of systems negative.    Past Medical History:  Diagnosis Date  . CHF (congestive heart failure) (HCC)    Diastolic, last ECHO from Jan 2017- normal EF 55%  . COPD (chronic obstructive pulmonary disease) (HCC)    Not on home o2  . Diabetes mellitus without complication ([place])    On Levemir  . Renal disorder    CKD stage 3  . Syncope    s/p pacemaker    Past Surgical History:  Procedure Laterality Date  .  APPENDECTOMY    . ESOPHAGOGASTRODUODENOSCOPY N/A 11/27/2015   Procedure: ESOPHAGOGASTRODUODENOSCOPY (EGD);  Surgeon: Lollie Sails, MD;  Location: [facility] ENDOSCOPY;  Service: Endoscopy;  Laterality: N/A;  . PACEMAKER INSERTION    . TONSILLECTOMY    . TUBAL LIGATION       reports that she quit smoking about 11 years ago. Her smoking use included Cigarettes. She smoked 2.00 packs per day. She has never used smokeless tobacco. She reports that she does not drink alcohol or use drugs.  Allergies  Allergen Reactions  . Quinapril Other (See Comments)    Reaction:  Elevated potassium   . Venlafaxine Other (See Comments)    Reaction:  GI upset   . Zetia [Ezetimibe] Nausea And Vomiting    Family History  Problem Relation Age of Onset  . Kidney cancer Father   . Esophageal cancer Brother      Prior to Admission medications   Medication Sig Start Date End Date Taking? Authorizing Provider  albuterol (PROVENTIL HFA;VENTOLIN HFA) 108 (90 Base) MCG/ACT inhaler Inhale 1-2 puffs into the lungs every 6 (six) hours as needed for wheezing or shortness of breath.   Yes [provider]  aspirin 81 MG EC tablet Take 81 mg by mouth daily. Swallow whole.   Yes [provider]  atorvastatin (LIPITOR) 80 MG  tablet Take 80 mg by mouth daily at 6 PM.    Yes [provider]  atorvastatin (LIPITOR) 80 MG tablet Take 80 mg by mouth daily. 07/19/17  Yes [provider]  cloNIDine (CATAPRES) 0.1 MG tablet Take 0.1 mg by mouth 2 (two) times daily.   Yes [provider]  esomeprazole (NEXIUM) 40 MG capsule Take 1 capsule (40 mg total) by mouth daily. 05/25/17  Yes Minna Merritts, MD  furosemide (LASIX) 40 MG tablet Take 20 mg by mouth every morning.    Yes [provider]  glimepiride (AMARYL) 4 MG tablet Take 4 mg by mouth daily with breakfast.   Yes [provider]  insulin aspart (NOVOLOG) 100 UNIT/ML FlexPen Inject 16 Units into the skin daily  with lunch.    Yes [provider]  insulin detemir (LEVEMIR) 100 UNIT/ML injection Inject 40 Units into the skin at bedtime.    Yes [provider]  lisinopril (PRINIVIL,ZESTRIL) 10 MG tablet Take 1 tablet (10 mg total) by mouth daily. 08/05/16  Yes Minna Merritts, MD  metFORMIN (GLUCOPHAGE) 1000 MG tablet Take 1,000 mg by mouth 2 (two) times daily with a meal. 07/19/17 07/19/18 Yes [provider]  metoprolol tartrate (LOPRESSOR) 25 MG tablet Take 25 mg by mouth 2 (two) times daily.   Yes [provider]  pioglitazone (ACTOS) 45 MG tablet Take 45 mg by mouth daily.   Yes [provider]  vitamin B-12 (CYANOCOBALAMIN) 1000 MCG tablet Take 1,000 mcg by mouth daily.   Yes [provider]  metFORMIN (GLUCOPHAGE) 500 MG tablet Take 500 mg by mouth 2 (two) times daily with a meal.     [provider]  ondansetron (ZOFRAN) 4 MG tablet Take 4 mg by mouth every 8 (eight) hours as needed for nausea or vomiting.    [provider]    Physical Exam: Vitals:   07/20/17 0930 07/20/17 1000 07/20/17 1030 07/20/17 1101  BP: (!) 142/65 97/78 105/89 (!) 138/51  Pulse: 84 86 88 82  Resp: (!) 28 (!) 21  18  Temp:    97.9 F (36.6 C)  TempSrc:      SpO2: 98% 98% 99% 100%  Weight:    97.1 kg (214 lb)  Height:    5\' 3"  (1.6 m)      Constitutional: NAD, calm, comfortable Vitals:   07/20/17 0930 07/20/17 1000 07/20/17 1030 07/20/17 1101  BP: (!) 142/65 97/78 105/89 (!) 138/51  Pulse: 84 86 88 82  Resp: (!) 28 (!) 21  18  Temp:    97.9 F (36.6 C)  TempSrc:      SpO2: 98% 98% 99% 100%  Weight:    97.1 kg (214 lb)  Height:    5\' 3"  (1.6 m)   Eyes: PERRL, lids and conjunctivae normal ENMT: Mucous membranes are moist. Posterior pharynx clear of any exudate or lesions. Poor dentition.  Neck: normal, supple, no masses, no thyromegaly Respiratory: clear to auscultation bilaterally, no wheezing, no crackles. Normal respiratory  effort. No accessory muscle use.  Cardiovascular: Regular rate and rhythm, no murmurs / rubs / gallops. No extremity edema. 2+ pedal pulses. No carotid bruits.  Abdomen: no tenderness, no masses palpated. No hepatosplenomegaly. Bowel sounds positive.  Musculoskeletal: no clubbing / cyanosis. No joint deformity upper and lower extremities. Good ROM, no contractures. Normal muscle tone.  Skin: no rashes, lesions, ulcers. No induration Neurologic: CN 2-12 grossly intact. Sensation intact, DTR normal. Strength 5/5 in  all 4.  Psychiatric: Normal judgment and insight. Alert and oriented x 3. Normal mood.     Labs on Admission: I have personally reviewed following labs and imaging studies  CBC:  Recent Labs Lab 07/20/17 0747  WBC 10.5  HGB 10.5*  HCT 33.3*  MCV 89.5  PLT 867   Basic Metabolic Panel:  Recent Labs Lab 07/20/17 0747  NA 137  K 4.4  CL 101  CO2 26  GLUCOSE 133*  BUN 38*  CREATININE 1.80*  CALCIUM 9.1   GFR: Estimated Creatinine Clearance: 31.4 mL/min (A) (by C-G formula based on SCr of 1.8 mg/dL (H)). Liver Function Tests: No results for input(s): AST, ALT, ALKPHOS, BILITOT, PROT, ALBUMIN in the last 168 hours. No results for input(s): LIPASE, AMYLASE in the last 168 hours. No results for input(s): AMMONIA in the last 168 hours. Coagulation Profile: No results for input(s): INR, PROTIME in the last 168 hours. Cardiac Enzymes:  Recent Labs Lab 07/20/17 0747 07/20/17 1123  TROPONINI 0.04* 0.58*   BNP (last 3 results) No results for input(s): PROBNP in the last 8760 hours. HbA1C: No results for input(s): HGBA1C in the last 72 hours. CBG: No results for input(s): GLUCAP in the last 168 hours. Lipid Profile: No results for input(s): CHOL, HDL, LDLCALC, TRIG, CHOLHDL, LDLDIRECT in the last 72 hours. Thyroid Function Tests: No results for input(s): TSH, T4TOTAL, FREET4, T3FREE, THYROIDAB in the last 72 hours. Anemia Panel: No results for input(s):  VITAMINB12, FOLATE, FERRITIN, TIBC, IRON, RETICCTPCT in the last 72 hours. Urine analysis: No results found for: COLORURINE, APPEARANCEUR, LABSPEC, PHURINE, GLUCOSEU, HGBUR, BILIRUBINUR, KETONESUR, PROTEINUR, UROBILINOGEN, NITRITE, LEUKOCYTESUR Sepsis Labs: !!!!!!!!!!!!!!!!!!!!!!!!!!!!!!!!!!!!!!!!!!!! @LABRCNTIP (procalcitonin:4,lacticidven:4) )No results found for this or any previous visit (from the past 240 hour(s)).   Radiological Exams on Admission: Dg Chest 2 View  Result Date: 07/20/2017 CLINICAL DATA:  Central chest pain EXAM: CHEST  2 VIEW COMPARISON:  11/28/2015 FINDINGS: Cardiac shadow remains enlarged. Pacing device is again seen and stable. Diffuse vascular congestion is again identified with mild interstitial edema present. Mild left basilar scarring is seen. No acute bony abnormality is noted. IMPRESSION: Changes of mild CHF. Stable left basilar scarring. Electronically Signed   By: Inez Catalina M.D.   On: 07/20/2017 09:22    EKG: Independently reviewed. Paced rhythm  Assessment/Plan Active Problems:   Chest pain     Chest pain - unstable angina and NSTEMI on differential - cardiology consult - cycle troponins - give heparin - s/p aspirin in ED - multiple comorbidities place patient at increased risk - given patient's renal function unsure if she would be a candidate for a cardiac cath: will defer to cardiology  DM - CBG ACHS - SSI - HgA1c  CKD Stage III - Cr appears to be around baseline of 1.8 - avoid nephrotoxic medications  Complete heart block s/p pacemaker - see's Dr. Caryl Comes outpatient      DVT prophylaxis: heparin (ACS dose)  Code Status: Full Code  Family Communication:  No family bedside  Disposition Plan: pending cardiac evaluation  Consults called: Cardiology  Admission status: Obs, Telemetry   Loretha Stapler MD [facility] Pager [phone]  If 7PM-7AM, please contact night-coverage [email or web address] Password  [email or web address]  07/20/2017, 1:08 PM

## 2017-07-20 NOTE — Progress Notes (Signed)
Notified Dr. Harl Bowie of elevated troponin level while he was on unit. Stated aware and patient to transfer to [facility] today. Stated okay for patient to receive oral medications as ordered this afternoon. Donavan Foil, RN

## 2017-07-20 NOTE — Progress Notes (Signed)
ANTICOAGULATION CONSULT NOTE - Initial Consult  Pharmacy Consult for Heparin Indication: chest pain/ACS  Allergies  Allergen Reactions  . Quinapril Other (See Comments)    Reaction:  Elevated potassium   . Venlafaxine Other (See Comments)    Reaction:  GI upset   . Zetia [Ezetimibe] Nausea And Vomiting    Patient Measurements: Height: 5\' 3"  (160 cm) Weight: 214 lb (97.1 kg) IBW/kg (Calculated) : 52.4 Heparin Dosing Weight: 74.6 kg  Vital Signs: Temp: 97.9 F (36.6 C) (11/01 1101) Temp Source: Oral (11/01 0737) BP: 138/51 (11/01 1101) Pulse Rate: 82 (11/01 1101)  Labs:  Recent Labs  07/20/17 0747 07/20/17 1123  HGB 10.5*  --   HCT 33.3*  --   PLT 298  --   CREATININE 1.80*  --   TROPONINI 0.04* 0.58*    Estimated Creatinine Clearance: 31.4 mL/min (A) (by C-G formula based on SCr of 1.8 mg/dL (H)).   Medical History: Past Medical History:  Diagnosis Date  . CHF (congestive heart failure) (HCC)    Diastolic, last ECHO from Jan 2017- normal EF 55%  . COPD (chronic obstructive pulmonary disease) (HCC)    Not on home o2  . Diabetes mellitus without complication ([place])    On Levemir  . Renal disorder    CKD stage 3  . Syncope    s/p pacemaker    Medications:  Prescriptions Prior to Admission  Medication Sig Dispense Refill Last Dose  . albuterol (PROVENTIL HFA;VENTOLIN HFA) 108 (90 Base) MCG/ACT inhaler Inhale 1-2 puffs into the lungs every 6 (six) hours as needed for wheezing or shortness of breath.   07/19/2017 at Unknown time  . aspirin 81 MG EC tablet Take 81 mg by mouth daily. Swallow whole.   07/19/2017 at Unknown time  . atorvastatin (LIPITOR) 80 MG tablet Take 80 mg by mouth daily at 6 PM.    Past Week at Unknown time  . atorvastatin (LIPITOR) 80 MG tablet Take 80 mg by mouth daily.   Past Week at Unknown time  . cloNIDine (CATAPRES) 0.1 MG tablet Take 0.1 mg by mouth 2 (two) times daily.   07/19/2017 at Unknown time  . esomeprazole (NEXIUM) 40 MG  capsule Take 1 capsule (40 mg total) by mouth daily. 90 capsule 3 07/19/2017 at Unknown time  . furosemide (LASIX) 40 MG tablet Take 20 mg by mouth every morning.    07/19/2017 at Unknown time  . glimepiride (AMARYL) 4 MG tablet Take 4 mg by mouth daily with breakfast.   07/19/2017 at Unknown time  . insulin aspart (NOVOLOG) 100 UNIT/ML FlexPen Inject 16 Units into the skin daily with lunch.    07/19/2017 at Unknown time  . insulin detemir (LEVEMIR) 100 UNIT/ML injection Inject 40 Units into the skin at bedtime.    07/19/2017 at Unknown time  . lisinopril (PRINIVIL,ZESTRIL) 10 MG tablet Take 1 tablet (10 mg total) by mouth daily. 90 tablet 3 07/19/2017 at Unknown time  . metFORMIN (GLUCOPHAGE) 1000 MG tablet Take 1,000 mg by mouth 2 (two) times daily with a meal.   07/19/2017 at Unknown time  . metoprolol tartrate (LOPRESSOR) 25 MG tablet Take 25 mg by mouth 2 (two) times daily.   07/19/2017 at 1030  . pioglitazone (ACTOS) 45 MG tablet Take 45 mg by mouth daily.   07/19/2017 at Unknown time  . vitamin B-12 (CYANOCOBALAMIN) 1000 MCG tablet Take 1,000 mcg by mouth daily.   07/19/2017 at Unknown time  . metFORMIN (GLUCOPHAGE) 500 MG tablet  Take 500 mg by mouth 2 (two) times daily with a meal.    Not Taking at Unknown time  . ondansetron (ZOFRAN) 4 MG tablet Take 4 mg by mouth every 8 (eight) hours as needed for nausea or vomiting.   Not Taking at Unknown time    Assessment: 71 yo female, elevated troponin Reviewed labs and PTA medications  Goal of Therapy:  Heparin level 0.3-0.7 units/ml Monitor platelets by anticoagulation protocol: Yes   Plan:  Give 4000 units bolus x 1 Start heparin infusion at 900 units/hr Check anti-Xa level in 6-8 hours and daily while on heparin Continue to monitor H&H and platelets  Abner Greenspan, Jonluke Cobbins Bennett 07/20/2017,1:19 PM

## 2017-07-20 NOTE — Progress Notes (Signed)
CRITICAL VALUE ALERT  Critical Value: troponin 2.66  Date & Time Notied: 07/20/17 1748  Provider Notified: Dr. Adair Patter  Orders Received/Actions taken: No new orders at this time. Continue heparin drip. MD aware awaiting bed at [facility] for transfer per Cardiology.

## 2017-07-20 NOTE — Consult Note (Signed)
Cardiology Consultation:   Patient ID: Teresa Mercado; [id number]; Jan 29, 1946   Admit date: 07/20/2017 Date of Consult: 07/20/2017  Primary Care Provider: Idelle Crouch, MD Primary Cardiologist: Dr Ida Rogue Primary Electrophysiologist:  Dr Virl Axe Consulting cardiologist: Dr Carlyle Dolly Requsting phsycian: Dr Adair Patter   Patient Profile:   Teresa Mercado is a 71 y.o. female with a hx of chronic diastolic heart failure, complete heart block with pacemaker, COPD, DM2, CKD 3, prior GI bleed  who is being seen today for the evaluation of chest pain at the request of Dr Adair Patter    History of Present Illness:   Teresa Mercado is a 71 yo female with a hx of chronic diastolic heart failure, complete heart block with pacemaker, COPD, DM2, CKD 3  who is being seen today for the evaluation of chest pain   Initially sharp pain midchest radiating to back between shoulder blades, some SOB. Lasted for approx 1 hr, radiated down into left arm. Not positional. Seemed to improve after receiving ASA.   Cr 1.8, K 4.4, WBC 10.5 Hgb 10.5 Plt 298  Trop 0.04-->0.58-->1.5 CXR mild CHF EKG A sensed, V-paced Per cards clinic note echo Jan 2017 LVEF 55%, no valve disease. Do not see report in epic.  Past Medical History:  Diagnosis Date  . CHF (congestive heart failure) (HCC)    Diastolic, last ECHO from Jan 2017- normal EF 55%  . COPD (chronic obstructive pulmonary disease) (HCC)    Not on home o2  . Diabetes mellitus without complication ([place])    On Levemir  . Renal disorder    CKD stage 3  . Syncope    s/p pacemaker    Past Surgical History:  Procedure Laterality Date  . APPENDECTOMY    . ESOPHAGOGASTRODUODENOSCOPY N/A 11/27/2015   Procedure: ESOPHAGOGASTRODUODENOSCOPY (EGD);  Surgeon: Lollie Sails, MD;  Location: [facility] ENDOSCOPY;  Service: Endoscopy;  Laterality: N/A;  . PACEMAKER INSERTION    . TONSILLECTOMY    . TUBAL LIGATION        Inpatient Medications: Scheduled  Meds: . mouth rinse  15 mL Mouth Rinse BID   Continuous Infusions: . heparin 900 Units/hr (07/20/17 1356)   PRN Meds: acetaminophen, ondansetron (ZOFRAN) IV  Allergies:    Allergies  Allergen Reactions  . Quinapril Other (See Comments)    Reaction:  Elevated potassium   . Venlafaxine Other (See Comments)    Reaction:  GI upset   . Zetia [Ezetimibe] Nausea And Vomiting    Social History:   Social History   Social History  . Marital status: Married    Spouse name: N/A  . Number of children: N/A  . Years of education: N/A   Occupational History  . Not on file.   Social History Main Topics  . Smoking status: Former Smoker    Packs/day: 2.00    Types: Cigarettes    Quit date: 11/24/2005  . Smokeless tobacco: Never Used     Comment: Quit 10 years ago, but has 45 years smoking prior to that  . Alcohol use No  . Drug use: No  . Sexual activity: Not on file   Other Topics Concern  . Not on file   Social History Narrative   Lives at home with husband.    Family History:    Family History  Problem Relation Age of Onset  . Kidney cancer Father   . Esophageal cancer Brother      ROS:  Please see the history of  present illness.  ROS  All other ROS reviewed and negative.     Physical Exam/Data:   Vitals:   07/20/17 0930 07/20/17 1000 07/20/17 1030 07/20/17 1101  BP: (!) 142/65 97/78 105/89 (!) 138/51  Pulse: 84 86 88 82  Resp: (!) 28 (!) 21  18  Temp:    97.9 F (36.6 C)  TempSrc:      SpO2: 98% 98% 99% 100%  Weight:    214 lb (97.1 kg)  Height:    5\' 3"  (1.6 m)   No intake or output data in the 24 hours ending 07/20/17 1412 Filed Weights   07/20/17 0738 07/20/17 1101  Weight: 214 lb (97.1 kg) 214 lb (97.1 kg)   Body mass index is 37.91 kg/m.  General:  Well nourished, well developed, in no acute distress HEENT: normal Lymph: no adenopathy Neck: no JVD Endocrine:  No thryomegaly Cardiac:  normal S1, S2; RRR; no murmur Lungs:  clear to  auscultation bilaterally, no wheezing, rhonchi or rales  Abd: soft, nontender, no hepatomegaly  Ext: no edema Musculoskeletal:  No deformities, BUE and BLE strength normal and equal Skin: warm and dry  Neuro:  CNs 2-12 intact, no focal abnormalities noted Psych:  Normal affect   EKG:  The EKG was personally reviewed and demonstrates:  V pacing Telemetry:  Telemetry was personally reviewed and demonstrates:  V pacing  Laboratory Data:  Chemistry Recent Labs Lab 07/20/17 0747  NA 137  K 4.4  CL 101  CO2 26  GLUCOSE 133*  BUN 38*  CREATININE 1.80*  CALCIUM 9.1  GFRNONAA 27*  GFRAA 31*  ANIONGAP 10    No results for input(s): PROT, ALBUMIN, AST, ALT, ALKPHOS, BILITOT in the last 168 hours. Hematology Recent Labs Lab 07/20/17 0747  WBC 10.5  RBC 3.72*  HGB 10.5*  HCT 33.3*  MCV 89.5  MCH 28.2  MCHC 31.5  RDW 15.5  PLT 298   Cardiac Enzymes Recent Labs Lab 07/20/17 0747 07/20/17 1123  TROPONINI 0.04* 0.58*   No results for input(s): TROPIPOC in the last 168 hours.  BNPNo results for input(s): BNP, PROBNP in the last 168 hours.  DDimer No results for input(s): DDIMER in the last 168 hours.  Radiology/Studies:  Dg Chest 2 View  Result Date: 07/20/2017 CLINICAL DATA:  Central chest pain EXAM: CHEST  2 VIEW COMPARISON:  11/28/2015 FINDINGS: Cardiac shadow remains enlarged. Pacing device is again seen and stable. Diffuse vascular congestion is again identified with mild interstitial edema present. Mild left basilar scarring is seen. No acute bony abnormality is noted. IMPRESSION: Changes of mild CHF. Stable left basilar scarring. Electronically Signed   By: Inez Catalina M.D.   On: 07/20/2017 09:22    Assessment and Plan:   1. NSTEMI - patient presents with chest pain, troponin trending up to 0.58 and now 1.5, and has not peaked. EKG V-paced cannot be interpreted for ischemia - medical therapy with heparin gtt, ASA. Continue home statin and lopressor. No ACE/ARB  due to poor renal function.  - discussed cath with patient and also her daughter by phone. I also spoke with her pcp per family request Dr Doy Hutching who is aware of plan for cath.  - we will transfer today to tele bed, plan for cath tomorrow. Will need echo as well - evidence of fluid overload by CXR with SOB, we will dose IV lasix 40mg  once. Avoid IVFs overnight despite her renal function and plans for cath tomorrow.  I have reviewed the risks, indications, and alternatives to cardiac catheterization, possible angioplasty, and stenting with the patient her daughter. Risks include but are not limited to bleeding, infection, vascular injury, stroke, myocardial infection, arrhythmia, kidney injury, radiation-related injury in the case of prolonged fluoroscopy use, emergency cardiac surgery, and death. The patient understands the risks of serious complication is 1-2 in 2836 with diagnostic cardiac cath and 1-2% or less with angioplasty/stenting.    For questions or updates, please contact [place] Please consult [email or web address] for contact info under Cardiology/STEMI.   Merrily Pew, MD  07/20/2017 2:12 PM

## 2017-07-20 NOTE — Progress Notes (Signed)
ANTICOAGULATION CONSULT NOTE - Initial Consult  Pharmacy Consult for Heparin Indication: chest pain/ACS  Allergies  Allergen Reactions  . Quinapril Other (See Comments)    Reaction:  Elevated potassium   . Venlafaxine Other (See Comments)    Reaction:  GI upset   . Zetia [Ezetimibe] Nausea And Vomiting    Patient Measurements: Height: 5\' 3"  (160 cm) Weight: 214 lb (97.1 kg) IBW/kg (Calculated) : 52.4 HEPARIN DW (KG): 75   Vital Signs: Temp: 98.1 F (36.7 C) (11/01 1515) BP: 172/83 (11/01 1515) Pulse Rate: 92 (11/01 1515)  Labs:  Recent Labs  07/20/17 0747 07/20/17 1123 07/20/17 1343 07/20/17 1644 07/20/17 1948  HGB 10.5*  --   --   --   --   HCT 33.3*  --   --   --   --   PLT 298  --   --   --   --   APTT  --   --  26  --   --   LABPROT  --   --  12.5  --   --   INR  --   --  0.94  --   --   HEPARINUNFRC  --   --   --   --  0.11*  CREATININE 1.80*  --   --   --   --   TROPONINI 0.04* 0.58* 1.50* 2.66*  --     Estimated Creatinine Clearance: 31.4 mL/min (A) (by C-G formula based on SCr of 1.8 mg/dL (H)).   Medical History: Past Medical History:  Diagnosis Date  . CHF (congestive heart failure) (HCC)    Diastolic, last ECHO from Jan 2017- normal EF 55%  . COPD (chronic obstructive pulmonary disease) (HCC)    Not on home o2  . Diabetes mellitus without complication ([place])    On Levemir  . Renal disorder    CKD stage 3  . Syncope    s/p pacemaker    Medications:  Prescriptions Prior to Admission  Medication Sig Dispense Refill Last Dose  . albuterol (PROVENTIL HFA;VENTOLIN HFA) 108 (90 Base) MCG/ACT inhaler Inhale 1-2 puffs into the lungs every 6 (six) hours as needed for wheezing or shortness of breath.   07/19/2017 at Unknown time  . aspirin 81 MG EC tablet Take 81 mg by mouth daily. Swallow whole.   07/19/2017 at Unknown time  . atorvastatin (LIPITOR) 80 MG tablet Take 80 mg by mouth daily at 6 PM.    Past Week at Unknown time  . atorvastatin  (LIPITOR) 80 MG tablet Take 80 mg by mouth daily.   Past Week at Unknown time  . cloNIDine (CATAPRES) 0.1 MG tablet Take 0.1 mg by mouth 2 (two) times daily.   07/19/2017 at Unknown time  . esomeprazole (NEXIUM) 40 MG capsule Take 1 capsule (40 mg total) by mouth daily. 90 capsule 3 07/19/2017 at Unknown time  . furosemide (LASIX) 40 MG tablet Take 20 mg by mouth every morning.    07/19/2017 at Unknown time  . glimepiride (AMARYL) 4 MG tablet Take 4 mg by mouth daily with breakfast.   07/19/2017 at Unknown time  . insulin aspart (NOVOLOG) 100 UNIT/ML FlexPen Inject 16 Units into the skin daily with lunch.    07/19/2017 at Unknown time  . insulin detemir (LEVEMIR) 100 UNIT/ML injection Inject 40 Units into the skin at bedtime.    07/19/2017 at Unknown time  . lisinopril (PRINIVIL,ZESTRIL) 10 MG tablet Take 1 tablet (10 mg total) by  mouth daily. 90 tablet 3 07/19/2017 at Unknown time  . metFORMIN (GLUCOPHAGE) 1000 MG tablet Take 1,000 mg by mouth 2 (two) times daily with a meal.   07/19/2017 at Unknown time  . metoprolol tartrate (LOPRESSOR) 25 MG tablet Take 25 mg by mouth 2 (two) times daily.   07/19/2017 at 1030  . pioglitazone (ACTOS) 45 MG tablet Take 45 mg by mouth daily.   07/19/2017 at Unknown time  . vitamin B-12 (CYANOCOBALAMIN) 1000 MCG tablet Take 1,000 mcg by mouth daily.   07/19/2017 at Unknown time  . metFORMIN (GLUCOPHAGE) 500 MG tablet Take 500 mg by mouth 2 (two) times daily with a meal.    Not Taking at Unknown time  . ondansetron (ZOFRAN) 4 MG tablet Take 4 mg by mouth every 8 (eight) hours as needed for nausea or vomiting.   Not Taking at Unknown time   Assessment: 71 yo female, elevated troponin Heparin level below goal. Plan Tx to [facility] for cardiac workup.  No bleeding note.  Goal of Therapy:  Heparin level 0.3-0.7 units/ml Monitor platelets by anticoagulation protocol: Yes   Plan:  Give 2000 units bolus x 1 Increase heparin infusion at 1100 units/hr Check anti-Xa level in  6-8 hours and daily while on heparin Continue to monitor H&H and platelets  Teresa Mercado 07/20/2017,9:58 PM

## 2017-07-20 NOTE — ED Notes (Signed)
Pt states burning in central chest radiating into central back. Pt denies other symptoms. Pt states ate chicken fried steak for dinner. Pt had follow up with [facility] MD yesterday and felt fine. Also received flu shot yesterday.

## 2017-07-20 NOTE — Care Management Obs Status (Signed)
[place] [facility] NOTIFICATION   Patient Details  Name: Shaquna Geigle MRN: [id number] Date of Birth: May 20, 1946   Medicare Observation Status Notification Given:  Yes    Sherald Barge, RN 07/20/2017, 2:15 PM

## 2017-07-20 NOTE — ED Triage Notes (Signed)
PT arrives to ED today c/o center chest burning that radiates to her upper back. PT denies any SOB and states she has a pacemaker.

## 2017-07-20 NOTE — Progress Notes (Signed)
CRITICAL VALUE ALERT  Critical Value:  Troponin 0.58  Date & Time Notied:  07/20/17 1305  Provider Notified: Dr. Adair Patter  Orders Received/Actions taken: No orders at this time. Cardiology to see patient per MD.

## 2017-07-20 NOTE — ED Provider Notes (Signed)
[facility] EMERGENCY DEPARTMENT Provider Note   CSN: [id number] Arrival date & time: 07/20/17  2426     History   Chief Complaint Chief Complaint  Patient presents with  . Chest Pain    HPI Teresa Mercado is a 71 y.o. female.  comPlanes of burning in her chest anterior that this morning immediately prior to coming here.  No shortness of breath nausea or sweatiness.  No treatment prior to coming here.  Discomfort radiates to back she has never had similar discomfort before  HPI  Past Medical History:  Diagnosis Date  . CHF (congestive heart failure) (HCC)    Diastolic, last ECHO from Jan 2017- normal EF 55%  . COPD (chronic obstructive pulmonary disease) (HCC)    Not on home o2  . Diabetes mellitus without complication ([place])    On Levemir  . Renal disorder    CKD stage 3  . Syncope    s/p pacemaker    Patient Active Problem List   Diagnosis Date Noted  . Poorly controlled type 2 diabetes mellitus ([place]) 08/05/2016  . 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 Complete heart block ([place]) 08/05/2016  . Chronic diastolic CHF (congestive heart failure) ([place]) 12/08/2015  . Morbid obesity due to excess calories ([place]) 12/08/2015  . Pacemaker 12/08/2015  . Shortness of breath 12/08/2015  . Bilateral edema of lower extremity 12/08/2015  . Iron deficiency anemia due to chronic blood loss 12/08/2015  . Anemia 11/25/2015    Past Surgical History:  Procedure Laterality Date  . APPENDECTOMY    . ESOPHAGOGASTRODUODENOSCOPY N/A 11/27/2015   Procedure: ESOPHAGOGASTRODUODENOSCOPY (EGD);  Surgeon: Lollie Sails, MD;  Location: [facility] ENDOSCOPY;  Service: Endoscopy;  Laterality: N/A;  . PACEMAKER INSERTION    . TONSILLECTOMY    . TUBAL LIGATION      OB History    Gravida Para Term Preterm AB Living             5   SAB TAB Ectopic Multiple Live Births                   Home Medications    Prior to Admission medications   Medication  Sig Start Date End Date Taking? Authorizing Provider  atorvastatin (LIPITOR) 80 MG tablet Take 80 mg by mouth daily at 6 PM.     [provider]  cloNIDine (CATAPRES) 0.2 MG tablet Take 1 tablet (0.2 mg total) by mouth 2 (two) times daily. 05/25/17   Minna Merritts, MD  digoxin (LANOXIN) 0.125 MG tablet Take 1 tablet (0.125 mg total) by mouth every other day. 11/29/15   Gladstone Lighter, MD  esomeprazole (NEXIUM) 40 MG capsule Take 1 capsule (40 mg total) by mouth daily. 05/25/17   Minna Merritts, MD  ferrous sulfate (FERROUSUL) 325 (65 FE) MG tablet Take 1 tablet (325 mg total) by mouth 2 (two) times daily with a meal. Patient taking differently: Take 325 mg by mouth daily with breakfast.  11/29/15   Gladstone Lighter, MD  furosemide (LASIX) 40 MG tablet Take 20 mg by mouth every morning.     [provider]  glimepiride (AMARYL) 4 MG tablet Take 4 mg by mouth daily with breakfast.    [provider]  insulin aspart (NOVOLOG) 100 UNIT/ML FlexPen Inject 16 Units into the skin daily with lunch.     [provider]  insulin detemir (LEVEMIR) 100 UNIT/ML injection Inject 40 Units into the skin  at bedtime.     [provider]  lisinopril (PRINIVIL,ZESTRIL) 10 MG tablet Take 1 tablet (10 mg total) by mouth daily. 08/05/16   Minna Merritts, MD  metFORMIN (GLUCOPHAGE) 500 MG tablet Take 500 mg by mouth 2 (two) times daily with a meal.     [provider]  metoprolol tartrate (LOPRESSOR) 25 MG tablet Take 25 mg by mouth 2 (two) times daily.    [provider]  ondansetron (ZOFRAN) 4 MG tablet Take 4 mg by mouth every 8 (eight) hours as needed for nausea or vomiting.    [provider]  vitamin B-12 (CYANOCOBALAMIN) 1000 MCG tablet Take 1,000 mcg by mouth daily.    [provider]    Family History Family History  Problem Relation Age of Onset  . Kidney cancer Father   . Esophageal cancer Brother     Social  History Social History  Substance Use Topics  . Smoking status: Former Smoker    Packs/day: 2.00    Types: Cigarettes    Quit date: 11/24/2005  . Smokeless tobacco: Never Used     Comment: Quit 10 years ago, but has 45 years smoking prior to that  . Alcohol use No     Allergies   Quinapril; Venlafaxine; and Zetia [ezetimibe]   Review of Systems Review of Systems  Cardiovascular: Positive for chest pain.  Musculoskeletal: Positive for back pain.  Allergic/Immunologic: Positive for immunocompromised state.       Diabetic     Physical Exam Updated Vital Signs BP (!) 157/68 (BP Location: Left Arm)   Pulse 86   Temp 98 F (36.7 C) (Oral)   Resp 18   Ht 5\' 3"  (1.6 m)   Wt 97.1 kg (214 lb)   SpO2 97%   BMI 37.91 kg/m   Physical Exam  Constitutional: She appears well-developed and well-nourished.  HENT:  Head: Normocephalic and atraumatic.  Eyes: Pupils are equal, round, and reactive to light. Conjunctivae are normal.  Neck: Neck supple. No tracheal deviation present. No thyromegaly present.  Cardiovascular: Normal rate and regular rhythm.   No murmur heard. Pulmonary/Chest: Effort normal and breath sounds normal.  Abdominal: Soft. Bowel sounds are normal. She exhibits no distension. There is no tenderness.  Musculoskeletal: Normal range of motion. She exhibits no edema or tenderness.  Neurological: She is alert. Coordination normal.  Skin: Skin is warm and dry. No rash noted.  Psychiatric: She has a normal mood and affect.  Nursing note and vitals reviewed.    ED Treatments / Results  Labs (all labs ordered are listed, but only abnormal results are displayed) Labs Reviewed  BASIC METABOLIC PANEL  CBC  TROPONIN I   Results for orders placed or performed during the hospital encounter of 52/84/13  Basic metabolic panel  Result Value Ref Range   Sodium 137 135 - 145 mmol/L   Potassium 4.4 3.5 - 5.1 mmol/L   Chloride 101 101 - 111 mmol/L   CO2 26 22 - 32  mmol/L   Glucose, Bld 133 (H) 65 - 99 mg/dL   BUN 38 (H) 6 - 20 mg/dL   Creatinine, Ser 1.80 (H) 0.44 - 1.00 mg/dL   Calcium 9.1 8.9 - 10.3 mg/dL   GFR calc non Af Amer 27 (L) >60 mL/min   GFR calc Af Amer 31 (L) >60 mL/min   Anion gap 10 5 - 15  CBC  Result Value Ref Range   WBC 10.5 4.0 - 10.5 K/uL  RBC 3.72 (L) 3.87 - 5.11 MIL/uL   Hemoglobin 10.5 (L) 12.0 - 15.0 g/dL   HCT 33.3 (L) 36.0 - 46.0 %   MCV 89.5 78.0 - 100.0 fL   MCH 28.2 26.0 - 34.0 pg   MCHC 31.5 30.0 - 36.0 g/dL   RDW 15.5 11.5 - 15.5 %   Platelets 298 150 - 400 K/uL  Troponin I  Result Value Ref Range   Troponin I 0.04 (HH) <0.03 ng/mL  Digoxin level  Result Value Ref Range   Digoxin Level 0.8 0.8 - 2.0 ng/mL   Dg Chest 2 View  Result Date: 07/20/2017 CLINICAL DATA:  Central chest pain EXAM: CHEST  2 VIEW COMPARISON:  11/28/2015 FINDINGS: Cardiac shadow remains enlarged. Pacing device is again seen and stable. Diffuse vascular congestion is again identified with mild interstitial edema present. Mild left basilar scarring is seen. No acute bony abnormality is noted. IMPRESSION: Changes of mild CHF. Stable left basilar scarring. Electronically Signed   By: Inez Catalina M.D.   On: 07/20/2017 09:22   EKG  EKG Interpretation  Date/Time:  Thursday July 20 2017 07:36:10 EDT Ventricular Rate:  82 PR Interval:    QRS Duration: 146 QT Interval:  449 QTC Calculation: 525 R Axis:   -66 Text Interpretation:  Ventricular-paced rhythm No further analysis attempted due to paced rhythm No significant change was found Confirmed by Ezequiel Essex [id number]) on 07/20/2017 7:43:36 AM      Chest x-ray reviewed by me Radiology No results found.  Procedures Procedures (including critical care time)  Medications Ordered in ED Medications - No data to display   Initial Impression / Assessment and Plan / ED Course  I have reviewed the triage vital signs and the nursing notes.  Pertinent labs & imaging results that  were available during my care of the patient were reviewed by me and considered in my medical decision making (see chart for details).     920 AM patient asymptomatic without treatment.  Aspirin ordered.  In light of minimally elevated troponin hospitalist service consulted to evaluate patient for overnight stay.  Doubt aortic dissection with nearly identical blood pressures in both arms.  She is not a candidate for CT angiogram nor is she likely candidate for MRI scan in light of pacemaker and poor renal function Hospitalist consulted to arrange for overnight stay.  Differential diagnosis includesNSTEMI, unstable angina doubt CHF clinically.  No complaint of shortness of breath Final Clinical Impressions(s) / ED Diagnoses  Diagnosis #1chest pain at rest #2 renal insufficiency Final diagnoses:  None    New Prescriptions New Prescriptions   No medications on file     Orlie Dakin, MD 07/20/17 1046

## 2017-07-21 ENCOUNTER — Encounter (HOSPITAL_COMMUNITY)
Admission: EM | Disposition: A | Discharge status: Home / Self Care | Payer: Self-pay | Source: Home / Self Care | Attending: Internal Medicine

## 2017-07-21 ENCOUNTER — Inpatient Hospital Stay (HOSPITAL_COMMUNITY): Payer: Medicare Other

## 2017-07-21 DIAGNOSIS — I2510 Atherosclerotic heart disease of native coronary artery without angina pectoris: Secondary | ICD-10-CM

## 2017-07-21 DIAGNOSIS — I214 Non-ST elevation (NSTEMI) myocardial infarction: Secondary | ICD-10-CM

## 2017-07-21 DIAGNOSIS — R079 Chest pain, unspecified: Secondary | ICD-10-CM

## 2017-07-21 HISTORY — PX: LEFT HEART CATH AND CORONARY ANGIOGRAPHY: CATH118249

## 2017-07-21 LAB — CBC
HEMATOCRIT: 35.3 % — AB (ref 36.0–46.0)
HEMOGLOBIN: 10.9 g/dL — AB (ref 12.0–15.0)
MCH: 28.1 pg (ref 26.0–34.0)
MCHC: 30.9 g/dL (ref 30.0–36.0)
MCV: 91 fL (ref 78.0–100.0)
Platelets: 325 10*3/uL (ref 150–400)
RBC: 3.88 MIL/uL (ref 3.87–5.11)
RDW: 15.7 % — AB (ref 11.5–15.5)
WBC: 11.8 10*3/uL — AB (ref 4.0–10.5)

## 2017-07-21 LAB — GLUCOSE, CAPILLARY
GLUCOSE-CAPILLARY: 184 mg/dL — AB (ref 65–99)
Glucose-Capillary: 207 mg/dL — ABNORMAL HIGH (ref 65–99)
Glucose-Capillary: 143 mg/dL — ABNORMAL HIGH (ref 65–99)
Glucose-Capillary: 161 mg/dL — ABNORMAL HIGH (ref 65–99)

## 2017-07-21 LAB — TROPONIN I: TROPONIN I: 2.65 ng/mL — AB (ref <0.03)

## 2017-07-21 LAB — CREATININE, SERUM
CREATININE: 1.85 mg/dL — AB (ref 0.44–1.00)
GFR, EST AFRICAN AMERICAN: 30 mL/min — AB (ref >60)
GFR, EST NON AFRICAN AMERICAN: 26 mL/min — AB (ref >60)

## 2017-07-21 LAB — BASIC METABOLIC PANEL
ANION GAP: 11 (ref 5–15)
BUN: 34 mg/dL — ABNORMAL HIGH (ref 6–20)
CALCIUM: 9.5 mg/dL (ref 8.9–10.3)
CHLORIDE: 101 mmol/L (ref 101–111)
CO2: 29 mmol/L (ref 22–32)
Creatinine, Ser: 1.92 mg/dL — ABNORMAL HIGH (ref 0.44–1.00)
GFR calc non Af Amer: 25 mL/min — ABNORMAL LOW (ref >60)
GFR, EST AFRICAN AMERICAN: 29 mL/min — AB (ref >60)
GLUCOSE: 155 mg/dL — AB (ref 65–99)
POTASSIUM: 4.6 mmol/L (ref 3.5–5.1)
Sodium: 141 mmol/L (ref 135–145)

## 2017-07-21 LAB — ECHOCARDIOGRAM COMPLETE
Height: 63 in
WEIGHTICAEL: 3424.01 [oz_av]

## 2017-07-21 LAB — HEPARIN LEVEL (UNFRACTIONATED): Heparin Unfractionated: 0.47 IU/mL (ref 0.30–0.70)

## 2017-07-21 SURGERY — LEFT HEART CATH AND CORONARY ANGIOGRAPHY
Anesthesia: LOCAL

## 2017-07-21 MED ORDER — ASPIRIN 81 MG PO CHEW
81.0000 mg | CHEWABLE_TABLET | ORAL | Status: AC
  Administered 2017-07-21: 81 mg via ORAL
  Filled 2017-07-21: qty 1

## 2017-07-21 MED ORDER — SODIUM CHLORIDE 0.9% FLUSH
3.0000 mL | Freq: Two times a day (BID) | INTRAVENOUS | Status: DC
  Administered 2017-07-22 (×3): 3 mL via INTRAVENOUS

## 2017-07-21 MED ORDER — MIDAZOLAM HCL 2 MG/2ML IJ SOLN
INTRAMUSCULAR | Status: DC | PRN
  Administered 2017-07-21: 0.5 mg via INTRAVENOUS

## 2017-07-21 MED ORDER — VERAPAMIL HCL 2.5 MG/ML IV SOLN
INTRAVENOUS | Status: AC
  Filled 2017-07-21: qty 2

## 2017-07-21 MED ORDER — ASPIRIN 81 MG PO CHEW: 81.0000 mg | CHEWABLE_TABLET | ORAL | Status: DC

## 2017-07-21 MED ORDER — SODIUM CHLORIDE 0.9 % WEIGHT BASED INFUSION: 3.0000 mL/kg/h | INTRAVENOUS | Status: DC

## 2017-07-21 MED ORDER — SODIUM CHLORIDE 0.9% FLUSH: 3.0000 mL | INTRAVENOUS | Status: DC | PRN

## 2017-07-21 MED ORDER — LIDOCAINE HCL (PF) 1 % IJ SOLN
INTRAMUSCULAR | Status: DC | PRN
  Administered 2017-07-21: 2 mL

## 2017-07-21 MED ORDER — SODIUM CHLORIDE 0.9 % IV SOLN: 250.0000 mL | INTRAVENOUS | Status: DC | PRN

## 2017-07-21 MED ORDER — SODIUM CHLORIDE 0.9% FLUSH
3.0000 mL | Freq: Two times a day (BID) | INTRAVENOUS | Status: DC
  Administered 2017-07-21: 3 mL via INTRAVENOUS

## 2017-07-21 MED ORDER — IOPAMIDOL (ISOVUE-370) INJECTION 76%
INTRAVENOUS | Status: AC
  Filled 2017-07-21: qty 100

## 2017-07-21 MED ORDER — SODIUM CHLORIDE 0.9 % WEIGHT BASED INFUSION
1.0000 mL/kg/h | INTRAVENOUS | Status: DC
  Administered 2017-07-21: 1 mL/kg/h via INTRAVENOUS

## 2017-07-21 MED ORDER — SODIUM CHLORIDE 0.9 % WEIGHT BASED INFUSION: 1.0000 mL/kg/h | INTRAVENOUS | Status: DC

## 2017-07-21 MED ORDER — FENTANYL CITRATE (PF) 100 MCG/2ML IJ SOLN
INTRAMUSCULAR | Status: AC
  Filled 2017-07-21: qty 2

## 2017-07-21 MED ORDER — HEPARIN SODIUM (PORCINE) 1000 UNIT/ML IJ SOLN
INTRAMUSCULAR | Status: DC | PRN
  Administered 2017-07-21: 5000 [IU] via INTRAVENOUS

## 2017-07-21 MED ORDER — FENTANYL CITRATE (PF) 100 MCG/2ML IJ SOLN
INTRAMUSCULAR | Status: DC | PRN
  Administered 2017-07-21: 25 ug via INTRAVENOUS

## 2017-07-21 MED ORDER — HEPARIN (PORCINE) IN NACL 2-0.9 UNIT/ML-% IJ SOLN
INTRAMUSCULAR | Status: AC | PRN
  Administered 2017-07-21: 1000 mL

## 2017-07-21 MED ORDER — LIDOCAINE HCL (PF) 1 % IJ SOLN
INTRAMUSCULAR | Status: AC
  Filled 2017-07-21: qty 30

## 2017-07-21 MED ORDER — VERAPAMIL HCL 2.5 MG/ML IV SOLN
INTRAVENOUS | Status: DC | PRN
  Administered 2017-07-21: 10 mL via INTRA_ARTERIAL

## 2017-07-21 MED ORDER — ASPIRIN 81 MG PO CHEW
81.0000 mg | CHEWABLE_TABLET | Freq: Once | ORAL | Status: AC
  Administered 2017-07-21: 81 mg via ORAL
  Filled 2017-07-21: qty 1

## 2017-07-21 MED ORDER — IOPAMIDOL (ISOVUE-370) INJECTION 76%
INTRAVENOUS | Status: DC | PRN
  Administered 2017-07-21: 40 mL via INTRA_ARTERIAL

## 2017-07-21 MED ORDER — HEPARIN SODIUM (PORCINE) 1000 UNIT/ML IJ SOLN
INTRAMUSCULAR | Status: AC
  Filled 2017-07-21: qty 1

## 2017-07-21 MED ORDER — CARVEDILOL 3.125 MG PO TABS
3.1250 mg | ORAL_TABLET | Freq: Two times a day (BID) | ORAL | Status: DC
  Administered 2017-07-21 – 2017-07-23 (×4): 3.125 mg via ORAL
  Filled 2017-07-21 (×4): qty 1

## 2017-07-21 MED ORDER — MIDAZOLAM HCL 2 MG/2ML IJ SOLN
INTRAMUSCULAR | Status: AC
  Filled 2017-07-21: qty 2

## 2017-07-21 MED ORDER — HEPARIN (PORCINE) IN NACL 2-0.9 UNIT/ML-% IJ SOLN
INTRAMUSCULAR | Status: AC
  Filled 2017-07-21: qty 1000

## 2017-07-21 SURGICAL SUPPLY — 10 items
CATH IMPULSE 5F ANG/FL3.5 (CATHETERS) ×2 IMPLANT
DEVICE RAD COMP TR BAND LRG (VASCULAR PRODUCTS) ×2 IMPLANT
GLIDESHEATH SLEND SS 6F .021 (SHEATH) ×2 IMPLANT
GUIDEWIRE INQWIRE 1.5J.035X260 (WIRE) ×1 IMPLANT
INQWIRE 1.5J .035X260CM (WIRE) ×2
KIT HEART LEFT (KITS) ×2 IMPLANT
PACK CARDIAC CATHETERIZATION (CUSTOM PROCEDURE TRAY) ×2 IMPLANT
TRANSDUCER W/STOPCOCK (MISCELLANEOUS) ×2 IMPLANT
TUBING CIL FLEX 10 FLL-RA (TUBING) ×2 IMPLANT
WIRE HI TORQ VERSACORE-J 145CM (WIRE) ×2 IMPLANT

## 2017-07-21 NOTE — H&P (View-Only) (Signed)
Progress Note  Patient Name: Teresa Mercado Date of Encounter: 07/21/2017   Subjective   No chest pain overnight. SOB has resolved.   Inpatient Medications    Scheduled Meds: . atorvastatin  80 mg Oral Daily  . cloNIDine  0.1 mg Oral BID  . insulin aspart  0-5 Units Subcutaneous QHS  . insulin aspart  0-9 Units Subcutaneous TID WC  . insulin detemir  20 Units Subcutaneous QHS  . mouth rinse  15 mL Mouth Rinse BID  . metoprolol tartrate  25 mg Oral BID  . pantoprazole  40 mg Oral Daily  . sodium chloride flush  3 mL Intravenous Q12H   Continuous Infusions: . sodium chloride    . sodium chloride 1 mL/kg/hr (07/21/17 0913)  . heparin 1,100 Units/hr (07/20/17 2203)   PRN Meds: sodium chloride, acetaminophen, albuterol, ondansetron (ZOFRAN) IV, sodium chloride flush   Vital Signs    Vitals:   07/20/17 1515 07/20/17 2025 07/20/17 2241 07/21/17 0625  BP: (!) 172/83  120/61 (!) 130/51  Pulse: 92  88 90  Resp: 18  15 17   Temp: 98.1 F (36.7 C)  98.2 F (36.8 C) (!) 97.4 F (36.3 C)  TempSrc:   Oral   SpO2: 93% 95% 98% 100%  Weight:      Height:        Intake/Output Summary (Last 24 hours) at 07/21/17 0930 Last data filed at 07/21/17 0500  Gross per 24 hour  Intake            149.5 ml  Output             1100 ml  Net           -950.5 ml   Filed Weights   07/20/17 0738 07/20/17 1101  Weight: 214 lb (97.1 kg) 214 lb (97.1 kg)    Telemetry    V-paced  ECG    n/a  Physical Exam   GEN: No acute distress.   Neck: No JVD Cardiac: RRR, no murmurs, rubs, or gallops.  Respiratory: Clear to auscultation bilaterally. GI: Soft, nontender, non-distended  MS: No edema; No deformity. Neuro:  Nonfocal  Psych: Normal affect   Labs    Chemistry Recent Labs Lab 07/20/17 0747 07/21/17 0444  NA 137 141  K 4.4 4.6  CL 101 101  CO2 26 29  GLUCOSE 133* 155*  BUN 38* 34*  CREATININE 1.80* 1.92*  CALCIUM 9.1 9.5  GFRNONAA 27* 25*  GFRAA 31* 29*  ANIONGAP 10  11     Hematology Recent Labs Lab 07/20/17 0747 07/21/17 0444  WBC 10.5 11.8*  RBC 3.72* 3.88  HGB 10.5* 10.9*  HCT 33.3* 35.3*  MCV 89.5 91.0  MCH 28.2 28.1  MCHC 31.5 30.9  RDW 15.5 15.7*  PLT 298 325    Cardiac Enzymes Recent Labs Lab 07/20/17 0747 07/20/17 1123 07/20/17 1343 07/20/17 1644  TROPONINI 0.04* 0.58* 1.50* 2.66*   No results for input(s): TROPIPOC in the last 168 hours.   BNP Recent Labs Lab 07/20/17 1343  BNP 122.0*     DDimer No results for input(s): DDIMER in the last 168 hours.   Radiology    Dg Chest 2 View  Result Date: 07/20/2017 CLINICAL DATA:  Central chest pain EXAM: CHEST  2 VIEW COMPARISON:  11/28/2015 FINDINGS: Cardiac shadow remains enlarged. Pacing device is again seen and stable. Diffuse vascular congestion is again identified with mild interstitial edema present. Mild left basilar scarring is seen. No acute  bony abnormality is noted. IMPRESSION: Changes of mild CHF. Stable left basilar scarring. Electronically Signed   By: Inez Catalina M.D.   On: 07/20/2017 09:22    Cardiac Studies    Patient Profile     Teresa Mercado is a 71 y.o. female with a hx of chronic diastolic heart failure, complete heart block with pacemaker, COPD, DM2, CKD 3, prior GI bleed  who is being seen today for the evaluation of chest pain at the request of Dr Adair Patter  Assessment & Plan    1. NSTEMI - patient presents with chest pain, troponin trending up to 2.66 and has not peaked. EKG V-paced cannot be interpreted for ischemia - medical therapy with heparin gtt, ASA. Continue home statin and lopressor. No ACE/ARB due to poor renal function.  - discussed cath with patient and also her daughter by phone. I also spoke with her pcp per family request Dr Doy Hutching who is aware of plan for cath.  - not transferred yesterday due to lack of beds, plan for transfer today to cath lab. - she had some evidence of fluid overload on admission, given IV lasix yesterday. I  have not given IVFs prior to cath. Cr 1.9, discussed with patient possible kidney risk and she understands, work to minimize dye load. Baseline Cr is essentially 1.6-1.8, she is near her baseline.      For questions or updates, please contact [place] Please consult [email or web address] for contact info under Cardiology/STEMI.      Merrily Pew, MD  07/21/2017, 9:30 AM

## 2017-07-21 NOTE — Interval H&P Note (Signed)
History and Physical Interval Note:  07/21/2017 6:29 PM  Teresa Mercado  has presented today for cardiac catheterization, with the diagnosis of NSTEMI. The various methods of treatment have been discussed with the patient and family. After consideration of risks, benefits and other options for treatment, the patient has consented to  Procedure(s): LEFT HEART CATH AND CORONARY ANGIOGRAPHY (N/A) as a surgical intervention .  The patient's history has been reviewed, patient examined, no change in status, stable for surgery.  I have reviewed the patient's chart and labs.  Questions were answered to the patient's satisfaction.    Cath Lab Visit (complete for each Cath Lab visit)  Clinical Evaluation Leading to the Procedure:   ACS: Yes.    Non-ACS:  N/A  Oaklen Thiam

## 2017-07-21 NOTE — Progress Notes (Signed)
[facility] in to transport to [facility]. Pt is stable.  No c/o pain.

## 2017-07-21 NOTE — Brief Op Note (Signed)
BRIEF CARDIAC CATHETERIZATION NOTE  DATE: 07/21/2017 TIME: 7:14 PM  PATIENT:  Teresa Mercado  71 y.o. female  PRE-OPERATIVE DIAGNOSIS:  NSTEMI  POST-OPERATIVE DIAGNOSIS:  Non-obstructive CAD  PROCEDURE:  Procedure(s): LEFT HEART CATH AND CORONARY ANGIOGRAPHY (N/A)  SURGEON:  Surgeon(s) and Role:    * Virl Coble, MD - Primary  FINDINGS: 1. Mild to moderate, non-obstructive coronary artery disease. 2. Mildly elevated LVEDP.  RECOMMENDATIONS: 1. Degree of cardiomyopathy is out of proportion to CAD. 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 Start low-dose carvedilol. 3. Medical therapy and risk factor modification to prevent progression of CAD. 4. Consider gentle diuresis tomorrow, as renal function allows. Will hold off on diuresis tonight given CKD and contrast exposure.  [name], MD [facility]  Pager: [phone]

## 2017-07-21 NOTE — Progress Notes (Signed)
Progress Note  Patient Name: Teresa Mercado Date of Encounter: 07/21/2017   Subjective   No chest pain overnight. SOB has resolved.   Inpatient Medications    Scheduled Meds: . atorvastatin  80 mg Oral Daily  . cloNIDine  0.1 mg Oral BID  . insulin aspart  0-5 Units Subcutaneous QHS  . insulin aspart  0-9 Units Subcutaneous TID WC  . insulin detemir  20 Units Subcutaneous QHS  . mouth rinse  15 mL Mouth Rinse BID  . metoprolol tartrate  25 mg Oral BID  . pantoprazole  40 mg Oral Daily  . sodium chloride flush  3 mL Intravenous Q12H   Continuous Infusions: . sodium chloride    . sodium chloride 1 mL/kg/hr (07/21/17 0913)  . heparin 1,100 Units/hr (07/20/17 2203)   PRN Meds: sodium chloride, acetaminophen, albuterol, ondansetron (ZOFRAN) IV, sodium chloride flush   Vital Signs    Vitals:   07/20/17 1515 07/20/17 2025 07/20/17 2241 07/21/17 0625  BP: (!) 172/83  120/61 (!) 130/51  Pulse: 92  88 90  Resp: 18  15 17   Temp: 98.1 F (36.7 C)  98.2 F (36.8 C) (!) 97.4 F (36.3 C)  TempSrc:   Oral   SpO2: 93% 95% 98% 100%  Weight:      Height:        Intake/Output Summary (Last 24 hours) at 07/21/17 0930 Last data filed at 07/21/17 0500  Gross per 24 hour  Intake            149.5 ml  Output             1100 ml  Net           -950.5 ml   Filed Weights   07/20/17 0738 07/20/17 1101  Weight: 214 lb (97.1 kg) 214 lb (97.1 kg)    Telemetry    V-paced  ECG    n/a  Physical Exam   GEN: No acute distress.   Neck: No JVD Cardiac: RRR, no murmurs, rubs, or gallops.  Respiratory: Clear to auscultation bilaterally. GI: Soft, nontender, non-distended  MS: No edema; No deformity. Neuro:  Nonfocal  Psych: Normal affect   Labs    Chemistry Recent Labs Lab 07/20/17 0747 07/21/17 0444  NA 137 141  K 4.4 4.6  CL 101 101  CO2 26 29  GLUCOSE 133* 155*  BUN 38* 34*  CREATININE 1.80* 1.92*  CALCIUM 9.1 9.5  GFRNONAA 27* 25*  GFRAA 31* 29*  ANIONGAP 10  11     Hematology Recent Labs Lab 07/20/17 0747 07/21/17 0444  WBC 10.5 11.8*  RBC 3.72* 3.88  HGB 10.5* 10.9*  HCT 33.3* 35.3*  MCV 89.5 91.0  MCH 28.2 28.1  MCHC 31.5 30.9  RDW 15.5 15.7*  PLT 298 325    Cardiac Enzymes Recent Labs Lab 07/20/17 0747 07/20/17 1123 07/20/17 1343 07/20/17 1644  TROPONINI 0.04* 0.58* 1.50* 2.66*   No results for input(s): TROPIPOC in the last 168 hours.   BNP Recent Labs Lab 07/20/17 1343  BNP 122.0*     DDimer No results for input(s): DDIMER in the last 168 hours.   Radiology    Dg Chest 2 View  Result Date: 07/20/2017 CLINICAL DATA:  Central chest pain EXAM: CHEST  2 VIEW COMPARISON:  11/28/2015 FINDINGS: Cardiac shadow remains enlarged. Pacing device is again seen and stable. Diffuse vascular congestion is again identified with mild interstitial edema present. Mild left basilar scarring is seen. No acute  bony abnormality is noted. IMPRESSION: Changes of mild CHF. Stable left basilar scarring. Electronically Signed   By: Inez Catalina M.D.   On: 07/20/2017 09:22    Cardiac Studies    Patient Profile     Teresa Mercado is a 71 y.o. female with a hx of chronic diastolic heart failure, complete heart block with pacemaker, COPD, DM2, CKD 3, prior GI bleed  who is being seen today for the evaluation of chest pain at the request of Dr Adair Patter  Assessment & Plan    1. NSTEMI - patient presents with chest pain, troponin trending up to 2.66 and has not peaked. EKG V-paced cannot be interpreted for ischemia - medical therapy with heparin gtt, ASA. Continue home statin and lopressor. No ACE/ARB due to poor renal function.  - discussed cath with patient and also her daughter by phone. I also spoke with her pcp per family request Dr Doy Hutching who is aware of plan for cath.  - not transferred yesterday due to lack of beds, plan for transfer today to cath lab. - she had some evidence of fluid overload on admission, given IV lasix yesterday. I  have not given IVFs prior to cath. Cr 1.9, discussed with patient possible kidney risk and she understands, work to minimize dye load. Baseline Cr is essentially 1.6-1.8, she is near her baseline.      For questions or updates, please contact [place] Please consult [email or web address] for contact info under Cardiology/STEMI.      Merrily Pew, MD  07/21/2017, 9:30 AM

## 2017-07-21 NOTE — Plan of Care (Signed)
Problem: Education: Goal: Knowledge of [place] [facility] Education information/materials will improve Outcome: Progressing Discussed general educational  Information with client regarding medical regimen and plan of care.

## 2017-07-21 NOTE — Progress Notes (Signed)
*  PRELIMINARY RESULTS* Echocardiogram 2D Echocardiogram has been performed.  Samuel Germany 07/21/2017, 12:27 PM

## 2017-07-21 NOTE — Plan of Care (Signed)
Problem: Safety: Goal: Ability to remain free from injury will improve Outcome: Progressing Discussed the need to remain injury-free during hospitalization.

## 2017-07-21 NOTE — Progress Notes (Signed)
PROGRESS NOTE    Teresa Mercado  WNI:[id number] DOB: 09-Nov-1945 DOA: 07/20/2017 PCP: Idelle Crouch, MD    Brief Narrative:  Teresa Mercado is a 71 y.o. female with medical history significant of CHF, COPD, DM, CKD Stage III and s/p pacemaker placement that presents with chest pain that began this morning when she was laying in bed.  Per patient she developed a chest pain that then spread around to her back and between her shoulder blades and reports that she also had some pain in her left arm.  She arrived to the ED and reports that nothing made it better and nothing made the pain worse.  While in the ED the pain disappeared.  She denied sweating, shortness of breath, nausea, vomiting with the pain.  Says she has not had pain like this before.  At one point she reports the pain felt somewhat like a heaviness on her chest.  Sees Dr. Caryl Comes, EP, for management of pacemaker and has another cardiologist.  Denies ever having chest pain on exertion.  Denies dyspnea with activity or at rest.    ED Course: Patient was seen by Dr. Winfred Leeds and given aspirin.  EKG showing paced rhythmn.  Troponin minimally elevated at 0.04.  Patient was pain free when TRH asked to admit to cycle troponins.  Blood pressures were measured in each arm and were almost identical.  As patient has chronic renal insufficiency was not a candidate for CTA.  Due to pacemaker she was not a candidate for MRI.    Seen by cardiology on 11/1 and diagnosed with NSTEMI.  Plan for cardiac cath on 11/2.  Assessment & Plan:   Principal Problem:   Chest pain Active Problems:   Chronic diastolic CHF (congestive heart failure) (HCC)   Poorly controlled type 2 diabetes mellitus (HCC)   CKD (chronic kidney disease), stage III (HCC)   Chest pain 2/2 NSTEMI - cardiology consulted - transfer to [facility] today for cardiac catheterization  DM - CBG ACHS - SSI - HgA1c 7.5  CKD Stage III - Cr appears to be around baseline of 1.8 - avoid  nephrotoxic medications - f/u BMP after cardiac cath  Complete heart block s/p pacemaker - see's Dr. Caryl Comes outpatient      DVT prophylaxis: heparin (ACS dose)  Code Status: Full Code  Family Communication:  No family bedside  Disposition Plan: transfer to [facility] today     Consultants:   Cardiology  Procedures:   None  Antimicrobials:   None    Subjective: Patient reports no chest pain since admission.  States that she is anxious to go to [facility] for cardiac cath.  Objective: Vitals:   07/20/17 1515 07/20/17 2025 07/20/17 2241 07/21/17 0625  BP: (!) 172/83  120/61 (!) 130/51  Pulse: 92  88 90  Resp: 18  15 17   Temp: 98.1 F (36.7 C)  98.2 F (36.8 C) (!) 97.4 F (36.3 C)  TempSrc:   Oral   SpO2: 93% 95% 98% 100%  Weight:      Height:        Intake/Output Summary (Last 24 hours) at 07/21/17 1126 Last data filed at 07/21/17 0800  Gross per 24 hour  Intake            149.5 ml  Output             1100 ml  Net           -950.5 ml   [employer]  07/20/17 0738 07/20/17 1101  Weight: 97.1 kg (214 lb) 97.1 kg (214 lb)    Examination:  General exam: Appears calm and comfortable  Respiratory system: Clear to auscultation. Respiratory effort normal. Cardiovascular system: S1 & S2 heard, RRR. No JVD, murmurs, rubs, gallops or clicks. No pedal edema. Gastrointestinal system: Abdomen is nondistended, soft and nontender. No organomegaly or masses felt. Normal bowel sounds heard. Central nervous system: Alert and oriented. No focal neurological deficits. Extremities: Symmetric 5 x 5 power. Skin: No rashes, lesions or ulcers Psychiatry: Judgement and insight appear normal. Mood & affect appropriate.     Data Reviewed: I have personally reviewed following labs and imaging studies  CBC:  Recent Labs Lab 07/20/17 0747 07/21/17 0444  WBC 10.5 11.8*  HGB 10.5* 10.9*  HCT 33.3* 35.3*  MCV 89.5 91.0  PLT 298 563   Basic Metabolic Panel:  Recent  Labs Lab 07/20/17 0747 07/21/17 0444  NA 137 141  K 4.4 4.6  CL 101 101  CO2 26 29  GLUCOSE 133* 155*  BUN 38* 34*  CREATININE 1.80* 1.92*  CALCIUM 9.1 9.5   GFR: Estimated Creatinine Clearance: 29.4 mL/min (A) (by C-G formula based on SCr of 1.92 mg/dL (H)). Liver Function Tests: No results for input(s): AST, ALT, ALKPHOS, BILITOT, PROT, ALBUMIN in the last 168 hours. No results for input(s): LIPASE, AMYLASE in the last 168 hours. No results for input(s): AMMONIA in the last 168 hours. Coagulation Profile:  Recent Labs Lab 07/20/17 1343  INR 0.94   Cardiac Enzymes:  Recent Labs Lab 07/20/17 0747 07/20/17 1123 07/20/17 1343 07/20/17 1644 07/21/17 0947  TROPONINI 0.04* 0.58* 1.50* 2.66* 2.65*   BNP (last 3 results) No results for input(s): PROBNP in the last 8760 hours. HbA1C:  Recent Labs  07/20/17 1830  HGBA1C 7.5*   CBG:  Recent Labs Lab 07/20/17 2224 07/21/17 0813 07/21/17 1124  GLUCAP 124* 207* 143*   Lipid Profile: No results for input(s): CHOL, HDL, LDLCALC, TRIG, CHOLHDL, LDLDIRECT in the last 72 hours. Thyroid Function Tests: No results for input(s): TSH, T4TOTAL, FREET4, T3FREE, THYROIDAB in the last 72 hours. Anemia Panel: No results for input(s): VITAMINB12, FOLATE, FERRITIN, TIBC, IRON, RETICCTPCT in the last 72 hours. Sepsis Labs: No results for input(s): PROCALCITON, LATICACIDVEN in the last 168 hours.  No results found for this or any previous visit (from the past 240 hour(s)).       Radiology Studies: Dg Chest 2 View  Result Date: 07/20/2017 CLINICAL DATA:  Central chest pain EXAM: CHEST  2 VIEW COMPARISON:  11/28/2015 FINDINGS: Cardiac shadow remains enlarged. Pacing device is again seen and stable. Diffuse vascular congestion is again identified with mild interstitial edema present. Mild left basilar scarring is seen. No acute bony abnormality is noted. IMPRESSION: Changes of mild CHF. Stable left basilar scarring.  Electronically Signed   By: Inez Catalina M.D.   On: 07/20/2017 09:22        Scheduled Meds: . aspirin  81 mg Oral Once  . atorvastatin  80 mg Oral Daily  . cloNIDine  0.1 mg Oral BID  . insulin aspart  0-5 Units Subcutaneous QHS  . insulin aspart  0-9 Units Subcutaneous TID WC  . insulin detemir  20 Units Subcutaneous QHS  . mouth rinse  15 mL Mouth Rinse BID  . metoprolol tartrate  25 mg Oral BID  . pantoprazole  40 mg Oral Daily  . sodium chloride flush  3 mL Intravenous Q12H   Continuous Infusions: .  sodium chloride    . sodium chloride 1 mL/kg/hr (07/21/17 0913)  . heparin 1,100 Units/hr (07/20/17 2203)     LOS: 1 day    Time spent: 30 minutes    Loretha Stapler, MD [facility] Pager [phone]  If 7PM-7AM, please contact night-coverage [email or web address] Password [email or web address] 07/21/2017, 11:26 AM

## 2017-07-21 NOTE — Progress Notes (Signed)
TR  BAND REMOVAL  LOCATION:    right radial  DEFLATED PER PROTOCOL:    Yes.    TIME BAND OFF / DRESSING APPLIED:    22:00   SITE UPON ARRIVAL:    Level 0  SITE AFTER BAND REMOVAL:    Level 0  CIRCULATION SENSATION AND MOVEMENT:    Within Normal Limits   Yes.    COMMENTS:   Post TR band instructions given. Pt tolerated well. 

## 2017-07-21 NOTE — Progress Notes (Signed)
[facility] for Heparin Indication: chest pain/ACS  Allergies  Allergen Reactions  . Quinapril Other (See Comments)    Reaction:  Elevated potassium   . Venlafaxine Other (See Comments)    Reaction:  GI upset   . Zetia [Ezetimibe] Nausea And Vomiting    Patient Measurements: Height: 5\' 3"  (160 cm) Weight: 214 lb (97.1 kg) IBW/kg (Calculated) : 52.4 HEPARIN DW (KG): 75   Vital Signs: Temp: 97.4 F (36.3 C) (11/02 0625) Temp Source: Oral (11/01 2241) BP: 130/51 (11/02 0625) Pulse Rate: 90 (11/02 0625)  Labs:  Recent Labs  07/20/17 0747 07/20/17 1123 07/20/17 1343 07/20/17 1644 07/20/17 1948 07/21/17 0444  HGB 10.5*  --   --   --   --  10.9*  HCT 33.3*  --   --   --   --  35.3*  PLT 298  --   --   --   --  325  APTT  --   --  26  --   --   --   LABPROT  --   --  12.5  --   --   --   INR  --   --  0.94  --   --   --   HEPARINUNFRC  --   --   --   --  0.11* 0.47  CREATININE 1.80*  --   --   --   --  1.92*  TROPONINI 0.04* 0.58* 1.50* 2.66*  --   --     Estimated Creatinine Clearance: 29.4 mL/min (A) (by C-G formula based on SCr of 1.92 mg/dL (H)).   Medical History: Past Medical History:  Diagnosis Date  . CHF (congestive heart failure) (HCC)    Diastolic, last ECHO from Jan 2017- normal EF 55%  . COPD (chronic obstructive pulmonary disease) (HCC)    Not on home o2  . Diabetes mellitus without complication ([place])    On Levemir  . Renal disorder    CKD stage 3  . Syncope    s/p pacemaker    Medications:  Prescriptions Prior to Admission  Medication Sig Dispense Refill Last Dose  . albuterol (PROVENTIL HFA;VENTOLIN HFA) 108 (90 Base) MCG/ACT inhaler Inhale 1-2 puffs into the lungs every 6 (six) hours as needed for wheezing or shortness of breath.   07/19/2017 at Unknown time  . aspirin 81 MG EC tablet Take 81 mg by mouth daily. Swallow whole.   07/19/2017 at Unknown time  . atorvastatin (LIPITOR) 80 MG tablet Take 80  mg by mouth daily at 6 PM.    Past Week at Unknown time  . atorvastatin (LIPITOR) 80 MG tablet Take 80 mg by mouth daily.   Past Week at Unknown time  . cloNIDine (CATAPRES) 0.1 MG tablet Take 0.1 mg by mouth 2 (two) times daily.   07/19/2017 at Unknown time  . esomeprazole (NEXIUM) 40 MG capsule Take 1 capsule (40 mg total) by mouth daily. 90 capsule 3 07/19/2017 at Unknown time  . furosemide (LASIX) 40 MG tablet Take 20 mg by mouth every morning.    07/19/2017 at Unknown time  . glimepiride (AMARYL) 4 MG tablet Take 4 mg by mouth daily with breakfast.   07/19/2017 at Unknown time  . insulin aspart (NOVOLOG) 100 UNIT/ML FlexPen Inject 16 Units into the skin daily with lunch.    07/19/2017 at Unknown time  . insulin detemir (LEVEMIR) 100 UNIT/ML injection Inject 40 Units into the skin at  bedtime.    07/19/2017 at Unknown time  . lisinopril (PRINIVIL,ZESTRIL) 10 MG tablet Take 1 tablet (10 mg total) by mouth daily. 90 tablet 3 07/19/2017 at Unknown time  . metFORMIN (GLUCOPHAGE) 1000 MG tablet Take 1,000 mg by mouth 2 (two) times daily with a meal.   07/19/2017 at Unknown time  . metoprolol tartrate (LOPRESSOR) 25 MG tablet Take 25 mg by mouth 2 (two) times daily.   07/19/2017 at 1030  . pioglitazone (ACTOS) 45 MG tablet Take 45 mg by mouth daily.   07/19/2017 at Unknown time  . vitamin B-12 (CYANOCOBALAMIN) 1000 MCG tablet Take 1,000 mcg by mouth daily.   07/19/2017 at Unknown time  . metFORMIN (GLUCOPHAGE) 500 MG tablet Take 500 mg by mouth 2 (two) times daily with a meal.    Not Taking at Unknown time  . ondansetron (ZOFRAN) 4 MG tablet Take 4 mg by mouth every 8 (eight) hours as needed for nausea or vomiting.   Not Taking at Unknown time   Assessment: 71 yo female, elevated troponin Heparin level therapeutic Plan Tx to [facility] for cardiac workup.  No bleeding note.  Goal of Therapy:  Heparin level 0.3-0.7 units/ml Monitor platelets by anticoagulation protocol: Yes   Plan:  Continue heparin  infusion at 1100 units/hr Check anti-Xa level  daily while on heparin Continue to monitor H&H and platelets  Avraj Lindroth Poteet 07/21/2017,8:25 AM

## 2017-07-22 DIAGNOSIS — E1165 Type 2 diabetes mellitus with hyperglycemia: Secondary | ICD-10-CM

## 2017-07-22 DIAGNOSIS — I5021 Acute systolic (congestive) heart failure: Secondary | ICD-10-CM

## 2017-07-22 DIAGNOSIS — N183 Chronic kidney disease, stage 3 (moderate): Secondary | ICD-10-CM

## 2017-07-22 LAB — GLUCOSE, CAPILLARY
GLUCOSE-CAPILLARY: 225 mg/dL — AB (ref 65–99)
GLUCOSE-CAPILLARY: 269 mg/dL — AB (ref 65–99)
Glucose-Capillary: 166 mg/dL — ABNORMAL HIGH (ref 65–99)

## 2017-07-22 MED ORDER — FUROSEMIDE 10 MG/ML IJ SOLN
40.0000 mg | Freq: Once | INTRAMUSCULAR | Status: AC
  Administered 2017-07-22: 10:00:00 40 mg via INTRAVENOUS
  Filled 2017-07-22: qty 4

## 2017-07-22 MED ORDER — ASPIRIN 81 MG PO CHEW
81.0000 mg | CHEWABLE_TABLET | Freq: Every day | ORAL | Status: DC
  Administered 2017-07-22 – 2017-07-23 (×2): 81 mg via ORAL
  Filled 2017-07-22 (×2): qty 1

## 2017-07-22 MED ORDER — FUROSEMIDE 40 MG PO TABS
40.0000 mg | ORAL_TABLET | Freq: Every day | ORAL | Status: DC
  Administered 2017-07-23: 40 mg via ORAL
  Filled 2017-07-22: qty 1

## 2017-07-22 NOTE — Progress Notes (Signed)
DAILY PROGRESS NOTE   Patient Name: Teresa Mercado Date of Encounter: 07/22/2017  Chief Complaint   No chest pain overnight  Patient Profile   Teresa Mercado is a 71 y.o. female with a hx of chronic diastolic heart failure, complete heart block with pacemaker, COPD, DM2, CKD 3, prior GI bleed  who is being seen today for the evaluation of chest pain at the request of Dr Adair Patter  Subjective   S/p LHC yesterday, this demonstrated mild to moderate non-obstructive CAD, mildly elevated LVEDP, however, degree of cardiomyopathy (EF 25%) out of proportion to CAD - Takatsubo or NICM suspected. Low dose coreg started. History of PPM in 2010, followed by Caryl Comes, 100% v-paced d/t complete heart block - ?if this could be related to long-term V-pacing.  Objective   Vitals:   07/21/17 2100 07/21/17 2200 07/22/17 0456 07/22/17 0700  BP: (!) 119/42 (!) 126/44 (!) 143/53 (!) 112/40  Pulse: 96 96 98 92  Resp: (!) 26 (!) 21 (!) 27 (!) 21  Temp: 98 F (36.7 C)  98.4 F (36.9 C) 98.3 F (36.8 C)  TempSrc: Oral  Oral Oral  SpO2: 96% 96% 95% 94%  Weight:   206 lb 3.2 oz (93.5 kg)   Height:        Intake/Output Summary (Last 24 hours) at 07/22/17 0845 Last data filed at 07/22/17 0700  Gross per 24 hour  Intake              700 ml  Output             1050 ml  Net             -350 ml   Filed Weights   07/20/17 0738 07/20/17 1101 07/22/17 0456  Weight: 214 lb (97.1 kg) 214 lb (97.1 kg) 206 lb 3.2 oz (93.5 kg)    Physical Exam   General appearance: alert, no distress and moderately obese Neck: JVD - 3 cm above sternal notch, no carotid bruit and thyroid not enlarged, symmetric, no tenderness/mass/nodules Lungs: diminished breath sounds bibasilar and rales bibasilar Heart: regular rate and rhythm Abdomen: soft, non-tender; bowel sounds normal; no masses,  no organomegaly and obese Extremities: extremities normal, atraumatic, no cyanosis or edema Pulses: 2+ and symmetric Skin: Skin color,  texture, turgor normal. No rashes or lesions Neurologic: Grossly normal Psych: Pleasant  Inpatient Medications    Scheduled Meds: . atorvastatin  80 mg Oral Daily  . carvedilol  3.125 mg Oral BID WC  . cloNIDine  0.1 mg Oral BID  . insulin aspart  0-5 Units Subcutaneous QHS  . insulin aspart  0-9 Units Subcutaneous TID WC  . insulin detemir  20 Units Subcutaneous QHS  . mouth rinse  15 mL Mouth Rinse BID  . pantoprazole  40 mg Oral Daily  . sodium chloride flush  3 mL Intravenous Q12H    Continuous Infusions: . sodium chloride      PRN Meds: sodium chloride, acetaminophen, albuterol, ondansetron (ZOFRAN) IV, sodium chloride flush   Labs   Results for orders placed or performed during the hospital encounter of 07/20/17 (from the past 48 hour(s))  Troponin I-serum (0, 3, 6 hours)     Status: Abnormal   Collection Time: 07/20/17 11:23 AM  Result Value Ref Range   Troponin I 0.58 (HH) <0.03 ng/mL    Comment: CRITICAL VALUE NOTED.  VALUE IS CONSISTENT WITH PREVIOUSLY REPORTED AND CALLED VALUE.  Troponin I-serum (0, 3, 6 hours)  Status: Abnormal   Collection Time: 07/20/17  1:43 PM  Result Value Ref Range   Troponin I 1.50 (HH) <0.03 ng/mL    Comment: CRITICAL VALUE NOTED.  VALUE IS CONSISTENT WITH PREVIOUSLY REPORTED AND CALLED VALUE.  Protime-INR     Status: None   Collection Time: 07/20/17  1:43 PM  Result Value Ref Range   Prothrombin Time 12.5 11.4 - 15.2 seconds   INR 0.94   APTT     Status: None   Collection Time: 07/20/17  1:43 PM  Result Value Ref Range   aPTT 26 24 - 36 seconds  Brain natriuretic peptide     Status: Abnormal   Collection Time: 07/20/17  1:43 PM  Result Value Ref Range   B Natriuretic Peptide 122.0 (H) 0.0 - 100.0 pg/mL  Troponin I-serum (0, 3, 6 hours)     Status: Abnormal   Collection Time: 07/20/17  4:44 PM  Result Value Ref Range   Troponin I 2.66 (HH) <0.03 ng/mL    Comment: CRITICAL VALUE NOTED.  VALUE IS CONSISTENT WITH PREVIOUSLY  REPORTED AND CALLED VALUE.  Hemoglobin A1c     Status: Abnormal   Collection Time: 07/20/17  6:30 PM  Result Value Ref Range   Hgb A1c MFr Bld 7.5 (H) 4.8 - 5.6 %    Comment: (NOTE) Pre diabetes:          5.7%-6.4% Diabetes:              >6.4% Glycemic control for   <7.0% adults with diabetes    Mean Plasma Glucose 168.55 mg/dL    Comment: Performed at [facility] [address] [place], [place] [address]  Heparin level (unfractionated)     Status: Abnormal   Collection Time: 07/20/17  7:48 PM  Result Value Ref Range   Heparin Unfractionated 0.11 (L) 0.30 - 0.70 IU/mL    Comment:        IF HEPARIN RESULTS ARE BELOW EXPECTED VALUES, AND PATIENT DOSAGE HAS BEEN CONFIRMED, SUGGEST FOLLOW UP TESTING OF ANTITHROMBIN III LEVELS.   Glucose, capillary     Status: Abnormal   Collection Time: 07/20/17 10:24 PM  Result Value Ref Range   Glucose-Capillary 124 (H) 65 - 99 mg/dL   Comment 1 Notify RN    Comment 2 Document in Chart   Heparin level (unfractionated)     Status: None   Collection Time: 07/21/17  4:44 AM  Result Value Ref Range   Heparin Unfractionated 0.47 0.30 - 0.70 IU/mL    Comment:        IF HEPARIN RESULTS ARE BELOW EXPECTED VALUES, AND PATIENT DOSAGE HAS BEEN CONFIRMED, SUGGEST FOLLOW UP TESTING OF ANTITHROMBIN III LEVELS.   CBC     Status: Abnormal   Collection Time: 07/21/17  4:44 AM  Result Value Ref Range   WBC 11.8 (H) 4.0 - 10.5 K/uL   RBC 3.88 3.87 - 5.11 MIL/uL   Hemoglobin 10.9 (L) 12.0 - 15.0 g/dL   HCT 35.3 (L) 36.0 - 46.0 %   MCV 91.0 78.0 - 100.0 fL   MCH 28.1 26.0 - 34.0 pg   MCHC 30.9 30.0 - 36.0 g/dL   RDW 15.7 (H) 11.5 - 15.5 %   Platelets 325 150 - 400 K/uL  Basic metabolic panel     Status: Abnormal   Collection Time: 07/21/17  4:44 AM  Result Value Ref Range   Sodium 141 135 - 145 mmol/L   Potassium 4.6 3.5 -  5.1 mmol/L   Chloride 101 101 - 111 mmol/L   CO2 29 22 - 32 mmol/L   Glucose, Bld 155 (H) 65 - 99 mg/dL   BUN 34  (H) 6 - 20 mg/dL   Creatinine, Ser 1.92 (H) 0.44 - 1.00 mg/dL   Calcium 9.5 8.9 - 10.3 mg/dL   GFR calc non Af Amer 25 (L) >60 mL/min   GFR calc Af Amer 29 (L) >60 mL/min    Comment: (NOTE) The eGFR has been calculated using the CKD EPI equation. This calculation has not been validated in all clinical situations. eGFR's persistently <60 mL/min signify possible Chronic Kidney Disease.    Anion gap 11 5 - 15  Glucose, capillary     Status: Abnormal   Collection Time: 07/21/17  8:13 AM  Result Value Ref Range   Glucose-Capillary 207 (H) 65 - 99 mg/dL   Comment 1 Notify RN    Comment 2 Document in Chart   Troponin I (q 6hr x 3)     Status: Abnormal   Collection Time: 07/21/17  9:47 AM  Result Value Ref Range   Troponin I 2.65 (HH) <0.03 ng/mL    Comment: CRITICAL VALUE NOTED.  VALUE IS CONSISTENT WITH PREVIOUSLY REPORTED AND CALLED VALUE.  Glucose, capillary     Status: Abnormal   Collection Time: 07/21/17 11:24 AM  Result Value Ref Range   Glucose-Capillary 143 (H) 65 - 99 mg/dL   Comment 1 Notify RN    Comment 2 Document in Chart   Glucose, capillary     Status: Abnormal   Collection Time: 07/21/17  7:30 PM  Result Value Ref Range   Glucose-Capillary 184 (H) 65 - 99 mg/dL  Glucose, capillary     Status: Abnormal   Collection Time: 07/21/17  8:57 PM  Result Value Ref Range   Glucose-Capillary 161 (H) 65 - 99 mg/dL   Comment 1 Notify RN    Comment 2 Document in Chart   Creatinine, serum     Status: Abnormal   Collection Time: 07/21/17  9:31 PM  Result Value Ref Range   Creatinine, Ser 1.85 (H) 0.44 - 1.00 mg/dL   GFR calc non Af Amer 26 (L) >60 mL/min   GFR calc Af Amer 30 (L) >60 mL/min    Comment: (NOTE) The eGFR has been calculated using the CKD EPI equation. This calculation has not been validated in all clinical situations. eGFR's persistently <60 mL/min signify possible Chronic Kidney Disease.   Glucose, capillary     Status: Abnormal   Collection Time: 07/22/17   7:00 AM  Result Value Ref Range   Glucose-Capillary 166 (H) 65 - 99 mg/dL    ECG   N/A  Telemetry   V-paced rhythm at 87 - Personally Reviewed  Radiology    Dg Chest 2 View  Result Date: 07/20/2017 CLINICAL DATA:  Central chest pain EXAM: CHEST  2 VIEW COMPARISON:  11/28/2015 FINDINGS: Cardiac shadow remains enlarged. Pacing device is again seen and stable. Diffuse vascular congestion is again identified with mild interstitial edema present. Mild left basilar scarring is seen. No acute bony abnormality is noted. IMPRESSION: Changes of mild CHF. Stable left basilar scarring. Electronically Signed   By: Inez Catalina M.D.   On: 07/20/2017 09:22    Cardiac Studies   LV EF: 25%  ------------------------------------------------------------------- Indications:      Chest pain 786.51.  ------------------------------------------------------------------- History:   PMH:  Acquired from the patient and from the patient&'s chart.  PMH:  Complete heart block Morbid obesity. Chronic diastolic CHF (congestive heart failure)  Risk factors:  Diabetes mellitus.  ------------------------------------------------------------------- Study Conclusions  - Left ventricle: The cavity size was normal. Wall thickness was   increased in a pattern of mild LVH. Mid anterior, mid inferior,   mid inferoseptal, and all apical segments appeared akinetic. The   estimated ejection fraction was 25%. Features are consistent with   a pseudonormal left ventricular filling pattern, with concomitant   abnormal relaxation and increased filling pressure (grade 2   diastolic dysfunction). - Aortic valve: There was no stenosis. - Mitral valve: Moderately calcified annulus. Mildly thickened   leaflets . There was trivial regurgitation. - Left atrium: The atrium was mildly to moderately dilated. - Right ventricle: The cavity size was normal. Pacer wire or   catheter noted in right ventricle. Systolic function was  mildly   reduced with apical wall motion abnormality. - Pulmonary arteries: No complete TR doppler jet so unable to   estimate PA systolic pressure. - Pericardium, extracardiac: A trivial pericardial effusion was   identified.  Impressions:  - Normal LV size with mild LV hypertrophy. EF 25% with wall motion   abnormalities as noted. This is suggestive of coronary disease or   a stress (Takotsubo-type) cardiomyopathy. Moderate diastolic   dysfunction. Normal RV size with mildly decreased systolic   function (with apical wall motion abnormality).  Assessment   1. Principal Problem: 2.   Chest pain 3. Active Problems: 4.   Chronic diastolic CHF (congestive heart failure) ([place]) 5.   Poorly controlled type 2 diabetes mellitus ([place]) 6.   CKD (chronic kidney disease), stage III ([place]) 7.   Plan   1. No complaints today- has not been up walking. Short of breath with exertion. LVEDP at cath was 21 yesterday - SP02 93%. Will give additional IV lasix today - creatinine trending down. Ambulate, possible switch to po lasix tomorrow and work toward discharge. Will transfer to [facility].  Time Spent Directly with Patient:  I have spent a total of 25 minutes with the patient reviewing hospital notes, telemetry, EKGs, labs and examining the patient as well as establishing an assessment and plan that was discussed personally with the patient. > 50% of time was spent in direct patient care.  Length of Stay:  LOS: 2 days   Pixie Casino, MD, [place]  Attending Cardiologist  Direct Dial: [phone]  Fax: [phone]  Website:  [email or web address] 07/22/2017, 8:45 AM

## 2017-07-22 NOTE — Progress Notes (Addendum)
CARDIAC REHAB PHASE I   PRE:  Rate/Rhythm: 88 pacing    BP: sitting 121/48    SaO2: 96 2L, 90-91 RA  MODE:  Ambulation: 220 ft   POST:  Rate/Rhythm: 116 pacing    BP: sitting 140/58     SaO2: 88 2L  Pt SOB lying flat in bed. Felt some better sitting up. We tried RA however pt desat to 81 RA after walking 70 ft. Applied 2L and pt able to walk 150 ft more with SaO2 88-90 2L. To bathroom then to recliner. Discussed HF management including low sodium, daily wts, signs and sx. Explained HF booklet and encouraged pt to read it and also watch HF video. Discussed CRPII and will send referral to [place]. At this time, pt will need RW and home o2 (she does wear O2 at night). Of note, pt sts she did have an upsetting phone call a few days ago. Also she has had months of LEE and general fatigue. [place], ACSM 07/22/2017 10:18 AM

## 2017-07-23 ENCOUNTER — Encounter (HOSPITAL_COMMUNITY): Payer: Self-pay | Admitting: Cardiology

## 2017-07-23 DIAGNOSIS — I5032 Chronic diastolic (congestive) heart failure: Secondary | ICD-10-CM

## 2017-07-23 DIAGNOSIS — I5043 Acute on chronic combined systolic (congestive) and diastolic (congestive) heart failure: Secondary | ICD-10-CM

## 2017-07-23 DIAGNOSIS — Z950 Presence of cardiac pacemaker: Secondary | ICD-10-CM

## 2017-07-23 LAB — BASIC METABOLIC PANEL
ANION GAP: 8 (ref 5–15)
BUN: 30 mg/dL — ABNORMAL HIGH (ref 6–20)
CALCIUM: 8.8 mg/dL — AB (ref 8.9–10.3)
CO2: 27 mmol/L (ref 22–32)
Chloride: 102 mmol/L (ref 101–111)
Creatinine, Ser: 1.87 mg/dL — ABNORMAL HIGH (ref 0.44–1.00)
GFR calc Af Amer: 30 mL/min — ABNORMAL LOW (ref >60)
GFR, EST NON AFRICAN AMERICAN: 26 mL/min — AB (ref >60)
GLUCOSE: 194 mg/dL — AB (ref 65–99)
Potassium: 4.3 mmol/L (ref 3.5–5.1)
Sodium: 137 mmol/L (ref 135–145)

## 2017-07-23 LAB — GLUCOSE, CAPILLARY: GLUCOSE-CAPILLARY: 169 mg/dL — AB (ref 65–99)

## 2017-07-23 MED ORDER — CARVEDILOL 3.125 MG PO TABS: 3.1250 mg | ORAL_TABLET | Freq: Two times a day (BID) | ORAL | 2 refills | Status: DC

## 2017-07-23 MED ORDER — FUROSEMIDE 40 MG PO TABS: 40.0000 mg | ORAL_TABLET | Freq: Every day | ORAL | 2 refills | Status: DC

## 2017-07-23 NOTE — Discharge Summary (Signed)
Discharge Summary    Patient ID: Teresa Mercado,  MRN: [id number], DOB/AGE: 72/30/1947 72 y.o.  Admit date: 07/20/2017 Discharge date: 07/23/2017  Primary Care Provider: Fulton Reek D Primary Cardiologist: Dr. Rockey Situ, in [facility]  Discharge Diagnoses    Principal Problem:   Chest pain Active Problems:   Chronic diastolic CHF (congestive heart failure) ([place])   Poorly controlled type 2 diabetes mellitus ([place])   CKD (chronic kidney disease), stage III ([facility])   Allergies Allergies  Allergen Reactions  . Quinapril Other (See Comments)    Reaction:  Elevated potassium   . Venlafaxine Other (See Comments)    Reaction:  GI upset   . Zetia [Ezetimibe] Nausea And Vomiting    Diagnostic Studies/Procedures    LEFT HEART CATH AND CORONARY ANGIOGRAPHY  07/21/2017   Conclusions: 1. 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 2. Mildly elevated left ventricular filling pressure.  Recommendations: 1. Degree of cardiomyopathy (LVEF 25% by echo) is out of proportion to CAD. 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 Start low-dose carvedilol. 3. Medical therapy and risk factor modification to prevent progression of CAD. 4. Consider gentle diuresis tomorrow, as renal function allows. Will hold off on diuresis tonight given CKD and contrast exposure.   Echocardiogram 07/21/2017 Study Conclusions - Left ventricle: The cavity size was normal. Wall thickness was   increased in a pattern of mild LVH. Mid anterior, mid inferior,   mid inferoseptal, and all apical segments appeared akinetic. The   estimated ejection fraction was 25%. Features are consistent with   a pseudonormal left ventricular filling pattern, with concomitant   abnormal relaxation and increased filling pressure (grade 2   diastolic dysfunction). - Aortic valve: There was no stenosis. - Mitral valve: Moderately calcified annulus. Mildly thickened   leaflets . There was trivial  regurgitation. - Left atrium: The atrium was mildly to moderately dilated. - Right ventricle: The cavity size was normal. Pacer wire or   catheter noted in right ventricle. Systolic function was mildly   reduced with apical wall motion abnormality. - Pulmonary arteries: No complete TR doppler jet so unable to   estimate PA systolic pressure. - Pericardium, extracardiac: A trivial pericardial effusion was   identified.  Impressions: - Normal LV size with mild LV hypertrophy. EF 25% with wall motion   abnormalities as noted. This is suggestive of coronary disease or   a stress (Takotsubo-type) cardiomyopathy. Moderate diastolic   dysfunction. Normal RV size with mildly decreased systolic   function (with apical wall motion abnormality).  _____________   History of Present Illness     Teresa Mercado a 71 y.o.femalewith a hx of chronic diastolic heart failure, complete heart block with pacemaker, COPD, DM2, CKD 3, prior GI bleed who was admitted for the evaluation of chest pain. Initially sharp pain midchest radiating to back between shoulder blades, some SOB. Lasted for approx 1 hr, radiated down into left arm. Not positional. Seemed to improve after receiving ASA.   Hospital Course     Troponins were elevated with peak of 2.66. EKG could not be interpreted for ischemia as the the patient was V paced. She had evidence of fluid overload on CXR and shortness of breath. An echocardiogram was done which showed mild LVH and EF 25% with wall motion abnormalities suggestive of CAD or a stress induced cardiomyopathy (Takotsubo). She was taken for cardiac cath on 07/21/17 and found to have mild to moderate non-obstructive CAD, mildly elevated LVEDP, however, degree of cardiomyopathy (  EF 25%) out of proportion to CAD - Takatsubo or NICM suspected. Low dose coreg started. History of PPM in 2010, followed by Dr. Caryl Comes, 100% v-paced d/t complete heart block - ?if this could be related to long-term  V-pacing.   LVEDP on cath was 21. She has been diuresed with lasix. Wt is sown from 214 lbs to 203 lbs. Fluid balance is net negative 2L. Her breathing has improved significantly. She says she is better than her recent baseline. Will send home on lasix 40 mg daily (was previously on 20 mg daily)  Will arrange follow-up with her cardiologist, Dr. Rockey Situ,  and Dr. Caryl Comes for a pacemaker evaluation. (Message sent to the [place] office)  She may be a candidate for a CRT-P upgrade. NO ACE-I/ARB/Entresto d/t CKD.  SCr is stable on discharge at 1.87, K+ is 4.3  Patient has been seen by Dr. Debara Pickett today and deemed ready for discharge home. All follow up appointments have been scheduled. Discharge medications are listed below.  _____________  Discharge Vitals Blood pressure 133/71, pulse 86, temperature 98.2 F (36.8 C), temperature source Oral, resp. rate (!) 21, height 5\' 3"  (1.6 m), weight 203 lb 12.8 oz (92.4 kg), SpO2 97 %.  Filed Weights   07/20/17 1101 07/22/17 0456 07/23/17 0609  Weight: 214 lb 0 oz (97.1 kg) 206 lb 3.2 oz (93.5 kg) 203 lb 12.8 oz (92.4 kg)    Labs & Radiologic Studies    CBC Recent Labs    07/21/17 0444  WBC 11.8*  HGB 10.9*  HCT 35.3*  MCV 91.0  PLT 053   Basic Metabolic Panel Recent Labs    07/21/17 0444 07/21/17 2131 07/23/17 0859  NA 141  --  137  K 4.6  --  4.3  CL 101  --  102  CO2 29  --  27  GLUCOSE 155*  --  194*  BUN 34*  --  30*  CREATININE 1.92* 1.85* 1.87*  CALCIUM 9.5  --  8.8*   Liver Function Tests No results for input(s): AST, ALT, ALKPHOS, BILITOT, PROT, ALBUMIN in the last 72 hours. No results for input(s): LIPASE, AMYLASE in the last 72 hours. Cardiac Enzymes Recent Labs    07/20/17 1343 07/20/17 1644 07/21/17 0947  TROPONINI 1.50* 2.66* 2.65*   BNP Invalid input(s): POCBNP D-Dimer No results for input(s): DDIMER in the last 72 hours. Hemoglobin A1C Recent Labs    07/20/17 1830  HGBA1C 7.5*   Fasting Lipid  Panel No results for input(s): CHOL, HDL, LDLCALC, TRIG, CHOLHDL, LDLDIRECT in the last 72 hours. Thyroid Function Tests No results for input(s): TSH, T4TOTAL, T3FREE, THYROIDAB in the last 72 hours.  Invalid input(s): FREET3 _____________  Dg Chest 2 View  Result Date: 07/20/2017 CLINICAL DATA:  Central chest pain EXAM: CHEST  2 VIEW COMPARISON:  11/28/2015 FINDINGS: Cardiac shadow remains enlarged. Pacing device is again seen and stable. Diffuse vascular congestion is again identified with mild interstitial edema present. Mild left basilar scarring is seen. No acute bony abnormality is noted. IMPRESSION: Changes of mild CHF. Stable left basilar scarring. Electronically Signed   By: Inez Catalina M.D.   On: 07/20/2017 09:22   Disposition   Pt is being discharged home today in good condition.  Follow-up Plans & Appointments    Follow-up Information    Gollan, Kathlene November, MD Follow up.   Specialty:  Cardiology Why:  The [place] office will call you to schedule a cardiology hospital follow up.  Contact information: [place] [address] [phone]        Deboraha Sprang, MD Follow up.   Specialty:  Cardiology Why:  The [place] office will call you to schedule a cardiology hospital follow up for pacemaker check. Contact information: [place] [address] [phone]          Discharge Instructions    (HEART FAILURE PATIENTS) Call MD:  Anytime you have any of the following symptoms: 1) 3 pound weight gain in 24 hours or 5 pounds in 1 week 2) shortness of breath, with or without a dry hacking cough 3) swelling in the hands, feet or stomach 4) if you have to sleep on extra pillows at night in order to breathe.   Complete by:  As directed    Amb Referral to Cardiac Rehabilitation   Complete by:  As directed    Diagnosis:   NSTEMI Heart Failure (see criteria below if ordering Phase II)      Heart Failure Type:  Chronic Systolic & Diastolic   Diet - low sodium heart healthy   Complete by:  As directed    Discharge instructions   Complete by:  As directed    PLEASE REMEMBER TO BRING ALL OF YOUR MEDICATIONS TO EACH OF YOUR FOLLOW-UP OFFICE VISITS.  PLEASE ATTEND ALL SCHEDULED FOLLOW-UP APPOINTMENTS.   Activity: Increase activity slowly as tolerated. You may shower, but no soaking baths (or swimming) for 1 week. No driving for 1 week. No lifting over 10 lbs for 2 weeks. No sexual activity for 2 weeks.   You May Return to Work: in 1 week (if applicable)  Wound Care: You may wash cath site gently with soap and water. Keep cath site clean and dry. If you notice pain, swelling, bleeding or pus at your cath site, please call [phone].   Increase activity slowly   Complete by:  As directed       Discharge Medications   Current Discharge Medication List    START taking these medications   Details  carvedilol (COREG) 3.125 MG tablet Take 1 tablet (3.125 mg total) 2 (two) times daily with a meal by mouth. Qty: 30 tablet, Refills: 2      CONTINUE these medications which have CHANGED   Details  furosemide (LASIX) 40 MG tablet Take 1 tablet (40 mg total) daily by mouth. Qty: 30 tablet, Refills: 2      CONTINUE these medications which have NOT CHANGED   Details  albuterol (PROVENTIL HFA;VENTOLIN HFA) 108 (90 Base) MCG/ACT inhaler Inhale 1-2 puffs into the lungs every 6 (six) hours as needed for wheezing or shortness of breath.    aspirin 81 MG EC tablet Take 81 mg by mouth daily. Swallow whole.    !! atorvastatin (LIPITOR) 80 MG tablet Take 80 mg by mouth daily at 6 PM.     !! atorvastatin (LIPITOR) 80 MG tablet Take 80 mg by mouth daily.    cloNIDine (CATAPRES) 0.1 MG tablet Take 0.1 mg by mouth 2 (two) times daily.    esomeprazole (NEXIUM) 40 MG capsule Take 1 capsule (40 mg total) by mouth daily. Qty: 90 capsule, Refills: 3    glimepiride (AMARYL) 4 MG tablet  Take 4 mg by mouth daily with breakfast.    insulin aspart (NOVOLOG) 100 UNIT/ML FlexPen Inject 16 Units into the skin daily with lunch.     insulin detemir (LEVEMIR) 100 UNIT/ML injection Inject  40 Units into the skin at bedtime.     metFORMIN (GLUCOPHAGE) 1000 MG tablet Take 1,000 mg by mouth 2 (two) times daily with a meal.    pioglitazone (ACTOS) 45 MG tablet Take 45 mg by mouth daily.    vitamin B-12 (CYANOCOBALAMIN) 1000 MCG tablet Take 1,000 mcg by mouth daily.     !! - Potential duplicate medications found. Please discuss with provider.    STOP taking these medications     lisinopril (PRINIVIL,ZESTRIL) 10 MG tablet      metoprolol tartrate (LOPRESSOR) 25 MG tablet      ondansetron (ZOFRAN) 4 MG tablet          Aspirin prescribed at discharge?  Yes High Intensity Statin Prescribed? (Lipitor 40-80mg  or Crestor 20-40mg ): Yes Beta Blocker Prescribed? Yes For EF <40%, was ACEI/ARB Prescribed? No: CKD ADP Receptor Inhibitor Prescribed? (i.e. Plavix etc.-Includes Medically Managed Patients): No: only non-obstructive CAD on cath For EF <40%, Aldosterone Inhibitor Prescribed? No: CKD Was EF assessed during THIS hospitalization? Yes Was Cardiac Rehab II ordered? (Included Medically managed Patients): Yes   Outstanding Labs/Studies   Follow up with renal function on increased lasix.   Duration of Discharge Encounter   Greater than 30 minutes including physician time.  Signed, Daune Perch NP 07/23/2017, 10:15 AM

## 2017-07-23 NOTE — Discharge Instructions (Signed)
Radial Site Care Refer to this sheet in the next few weeks. These instructions provide you with information about caring for yourself after your procedure. Your health care provider may also give you more specific instructions. Your treatment has been planned according to current medical practices, but problems sometimes occur. Call your health care provider if you have any problems or questions after your procedure. What can I expect after the procedure? After your procedure, it is typical to have the following:  Bruising at the radial site that usually fades within 1-2 weeks.  Blood collecting in the tissue (hematoma) that may be painful to the touch. It should usually decrease in size and tenderness within 1-2 weeks.  Follow these instructions at home:  Take medicines only as directed by your health care provider.  You may shower 24-48 hours after the procedure or as directed by your health care provider. Remove the bandage (dressing) and gently wash the site with plain soap and water. Pat the area dry with a clean towel. Do not rub the site, because this may cause bleeding.  Do not take baths, swim, or use a hot tub until your health care provider approves.  Check your insertion site every day for redness, swelling, or drainage.  Do not apply powder or lotion to the site.  Do not flex or bend the affected arm for 24 hours or as directed by your health care provider.  Do not push or pull heavy objects with the affected arm for 24 hours or as directed by your health care provider.  Do not lift over 10 lb (4.5 kg) for 5 days after your procedure or as directed by your health care provider.  Ask your health care provider when it is okay to: ? Return to work or school. ? Resume usual physical activities or sports. ? Resume sexual activity.  Do not drive home if you are discharged the same day as the procedure. Have someone else drive you.  You may drive 24 hours after the procedure  unless otherwise instructed by your health care provider.  Do not operate machinery or power tools for 24 hours after the procedure.  If your procedure was done as an outpatient procedure, which means that you went home the same day as your procedure, a responsible adult should be with you for the first 24 hours after you arrive home.  Keep all follow-up visits as directed by your health care provider. This is important. Contact a health care provider if:  You have a fever.  You have chills.  You have increased bleeding from the radial site. Hold pressure on the site. Get help right away if:  You have unusual pain at the radial site.  You have redness, warmth, or swelling at the radial site.  You have drainage (other than a small amount of blood on the dressing) from the radial site.  The radial site is bleeding, and the bleeding does not stop after 30 minutes of holding steady pressure on the site.  Your arm or hand becomes pale, cool, tingly, or numb. This information is not intended to replace advice given to you by your health care provider. Make sure you discuss any questions you have with your health care provider. Document Released: 10/08/2010 Document Revised: 02/11/2016 Document Reviewed: 03/24/2014 Elsevier Interactive Patient Education  2018 [employer].   Diabetes Mellitus and Food It is important for you to manage your blood sugar (glucose) level. Your blood glucose level can be greatly affected  by what you eat. Eating healthier foods in the appropriate amounts throughout the day at about the same time each day will help you control your blood glucose level. It can also help slow or prevent worsening of your diabetes mellitus. Healthy eating may even help you improve the level of your blood pressure and reach or maintain a healthy weight. General recommendations for healthful eating and cooking habits include:  Eating meals and snacks regularly. Avoid going long  periods of time without eating to lose weight.  Eating a diet that consists mainly of plant-based foods, such as fruits, vegetables, nuts, legumes, and whole grains.  Using low-heat cooking methods, such as baking, instead of high-heat cooking methods, such as deep frying.  Work with your dietitian to make sure you understand how to use the Nutrition Facts information on food labels. How can food affect me? Carbohydrates Carbohydrates affect your blood glucose level more than any other type of food. Your dietitian will help you determine how many carbohydrates to eat at each meal and teach you how to count carbohydrates. Counting carbohydrates is important to keep your blood glucose at a healthy level, especially if you are using insulin or taking certain medicines for diabetes mellitus. Alcohol Alcohol can cause sudden decreases in blood glucose (hypoglycemia), especially if you use insulin or take certain medicines for diabetes mellitus. Hypoglycemia can be a life-threatening condition. Symptoms of hypoglycemia (sleepiness, dizziness, and disorientation) are similar to symptoms of having too much alcohol. If your health care provider has given you approval to drink alcohol, do so in moderation and use the following guidelines:  Women should not have more than one drink per day, and men should not have more than two drinks per day. One drink is equal to: ? 12 oz of beer. ? 5 oz of wine. ? 1 oz of hard liquor.  Do not drink on an empty stomach.  Keep yourself hydrated. Have water, diet soda, or unsweetened iced tea.  Regular soda, juice, and other mixers might contain a lot of carbohydrates and should be counted.  What foods are not recommended? As you make food choices, it is important to remember that all foods are not the same. Some foods have fewer nutrients per serving than other foods, even though they might have the same number of calories or carbohydrates. It is difficult to get  your body what it needs when you eat foods with fewer nutrients. Examples of foods that you should avoid that are high in calories and carbohydrates but low in nutrients include:  Trans fats (most processed foods list trans fats on the Nutrition Facts label).  Regular soda.  Juice.  Candy.  Sweets, such as cake, pie, doughnuts, and cookies.  Fried foods.  What foods can I eat? Eat nutrient-rich foods, which will nourish your body and keep you healthy. The food you should eat also will depend on several factors, including:  The calories you need.  The medicines you take.  Your weight.  Your blood glucose level.  Your blood pressure level.  Your cholesterol level.  You should eat a variety of foods, including:  Protein. ? Lean cuts of meat. ? Proteins low in saturated fats, such as fish, egg whites, and beans. Avoid processed meats.  Fruits and vegetables. ? Fruits and vegetables that may help control blood glucose levels, such as apples, mangoes, and yams.  Dairy products. ? Choose fat-free or low-fat dairy products, such as milk, yogurt, and cheese.  Grains, bread, pasta,  and rice. ? Choose whole grain products, such as multigrain bread, whole oats, and brown rice. These foods may help control blood pressure.  Fats. ? Foods containing healthful fats, such as nuts, avocado, olive oil, canola oil, and fish.  Does everyone with diabetes mellitus have the same meal plan? Because every person with diabetes mellitus is different, there is not one meal plan that works for everyone. It is very important that you meet with a dietitian who will help you create a meal plan that is just right for you. This information is not intended to replace advice given to you by your health care provider. Make sure you discuss any questions you have with your health care provider. Document Released: 06/02/2005 Document Revised: 02/11/2016 Document Reviewed: 08/02/2013 Elsevier Interactive  Patient Education  2017 [employer].

## 2017-07-23 NOTE — Progress Notes (Signed)
Pt's IV to left hand leaking during routine flush.  IV removed.  Unsuccessful attempt x 2 by 2 RN.  Pt declined anymore IV sticks at this time.  Charge nurse reviewed with pt the importance of maintaining IV access.  Pt verbalized her understanding and continues to decline further access.  Will continue to monitor pt closely.

## 2017-07-23 NOTE — Progress Notes (Signed)
Discharge order received.  Spoke to Daune Perch, NP she stated to have patient take her normal dose of metformin starting tomorrow.  This was relayed to patient.  Discharge instructions given to and reviewed with patient.  Heart failure education reviewed with and given to patient.

## 2017-07-23 NOTE — Progress Notes (Addendum)
DAILY PROGRESS NOTE   Patient Name: Teresa Mercado Date of Encounter: 07/23/2017  Chief Complaint   Breathing is better.  Patient Profile   Teresa Mercado is a 71 y.o. female with a hx of chronic diastolic heart failure, complete heart block with pacemaker, COPD, DM2, CKD 3, prior GI bleed  who is being seen today for the evaluation of chest pain at the request of Dr Adair Patter  Subjective   Diuresed about 1L more negative overnight. Net 2L negative. Weight 203 lbs. BMET pending. She feels better than she has felt in a long time.  Objective   Vitals:   07/22/17 1730 07/22/17 2141 07/23/17 0609 07/23/17 0837  BP: 101/71 (!) 107/52 (!) 120/57 133/71  Pulse: 93 84 99 86  Resp:  (!) 21 (!) 21   Temp:  98.3 F (36.8 C) 98.2 F (36.8 C)   TempSrc:  Oral Oral   SpO2:  95% 97%   Weight:   203 lb 12.8 oz (92.4 kg)   Height:        Intake/Output Summary (Last 24 hours) at 07/23/2017 0847 Last data filed at 07/23/2017 0130 Gross per 24 hour  Intake 243 ml  Output 1000 ml  Net -757 ml   Filed Weights   07/20/17 1101 07/22/17 0456 07/23/17 0609  Weight: 214 lb 0 oz (97.1 kg) 206 lb 3.2 oz (93.5 kg) 203 lb 12.8 oz (92.4 kg)    Physical Exam   General appearance: alert, no distress and moderately obese Neck: JVD - 1 cm above sternal notch, no carotid bruit and thyroid not enlarged, symmetric, no tenderness/mass/nodules Lungs: diminished breath sounds bibasilar and rales bibasilar Heart: regular rate and rhythm Abdomen: soft, non-tender; bowel sounds normal; no masses,  no organomegaly and obese Extremities: extremities normal, atraumatic, no cyanosis or edema Pulses: 2+ and symmetric Skin: Skin color, texture, turgor normal. No rashes or lesions Neurologic: Grossly normal Psych: Pleasant  Inpatient Medications    Scheduled Meds: . aspirin  81 mg Oral Daily  . atorvastatin  80 mg Oral Daily  . carvedilol  3.125 mg Oral BID WC  . cloNIDine  0.1 mg Oral BID  . furosemide  40 mg  Oral Daily  . insulin aspart  0-5 Units Subcutaneous QHS  . insulin aspart  0-9 Units Subcutaneous TID WC  . insulin detemir  20 Units Subcutaneous QHS  . mouth rinse  15 mL Mouth Rinse BID  . pantoprazole  40 mg Oral Daily  . sodium chloride flush  3 mL Intravenous Q12H    Continuous Infusions: . sodium chloride       PRN Meds: sodium chloride, acetaminophen, albuterol, ondansetron (ZOFRAN) IV, sodium chloride flush   Labs   Results for orders placed or performed during the hospital encounter of 07/20/17 (from the past 48 hour(s))  Troponin I (q 6hr x 3)     Status: Abnormal   Collection Time: 07/21/17  9:47 AM  Result Value Ref Range   Troponin I 2.65 (HH) <0.03 ng/mL    Comment: CRITICAL VALUE NOTED.  VALUE IS CONSISTENT WITH PREVIOUSLY REPORTED AND CALLED VALUE.  Glucose, capillary     Status: Abnormal   Collection Time: 07/21/17 11:24 AM  Result Value Ref Range   Glucose-Capillary 143 (H) 65 - 99 mg/dL   Comment 1 Notify RN    Comment 2 Document in Chart   Glucose, capillary     Status: Abnormal   Collection Time: 07/21/17  7:30 PM  Result Value Ref  Range   Glucose-Capillary 184 (H) 65 - 99 mg/dL  Glucose, capillary     Status: Abnormal   Collection Time: 07/21/17  8:57 PM  Result Value Ref Range   Glucose-Capillary 161 (H) 65 - 99 mg/dL   Comment 1 Notify RN    Comment 2 Document in Chart   Creatinine, serum     Status: Abnormal   Collection Time: 07/21/17  9:31 PM  Result Value Ref Range   Creatinine, Ser 1.85 (H) 0.44 - 1.00 mg/dL   GFR calc non Af Amer 26 (L) >60 mL/min   GFR calc Af Amer 30 (L) >60 mL/min    Comment: (NOTE) The eGFR has been calculated using the CKD EPI equation. This calculation has not been validated in all clinical situations. eGFR's persistently <60 mL/min signify possible Chronic Kidney Disease.   Glucose, capillary     Status: Abnormal   Collection Time: 07/22/17  7:00 AM  Result Value Ref Range   Glucose-Capillary 166 (H) 65 -  99 mg/dL  Glucose, capillary     Status: Abnormal   Collection Time: 07/22/17  4:53 PM  Result Value Ref Range   Glucose-Capillary 225 (H) 65 - 99 mg/dL  Glucose, capillary     Status: Abnormal   Collection Time: 07/22/17  9:38 PM  Result Value Ref Range   Glucose-Capillary 269 (H) 65 - 99 mg/dL   Comment 1 Notify RN   Glucose, capillary     Status: Abnormal   Collection Time: 07/23/17  7:35 AM  Result Value Ref Range   Glucose-Capillary 169 (H) 65 - 99 mg/dL    ECG   N/A  Telemetry   V-paced rhythm at 87 - Personally Reviewed  Radiology    No results found.  Cardiac Studies   LV EF: 25%  ------------------------------------------------------------------- Indications:      Chest pain 786.51.  ------------------------------------------------------------------- History:   PMH:  Acquired from the patient and from the patient&'s chart.  PMH:  Complete heart block Morbid obesity. Chronic diastolic CHF (congestive heart failure)  Risk factors:  Diabetes mellitus.  ------------------------------------------------------------------- Study Conclusions  - Left ventricle: The cavity size was normal. Wall thickness was   increased in a pattern of mild LVH. Mid anterior, mid inferior,   mid inferoseptal, and all apical segments appeared akinetic. The   estimated ejection fraction was 25%. Features are consistent with   a pseudonormal left ventricular filling pattern, with concomitant   abnormal relaxation and increased filling pressure (grade 2   diastolic dysfunction). - Aortic valve: There was no stenosis. - Mitral valve: Moderately calcified annulus. Mildly thickened   leaflets . There was trivial regurgitation. - Left atrium: The atrium was mildly to moderately dilated. - Right ventricle: The cavity size was normal. Pacer wire or   catheter noted in right ventricle. Systolic function was mildly   reduced with apical wall motion abnormality. - Pulmonary arteries:  No complete TR doppler jet so unable to   estimate PA systolic pressure. - Pericardium, extracardiac: A trivial pericardial effusion was   identified.  Impressions:  - Normal LV size with mild LV hypertrophy. EF 25% with wall motion   abnormalities as noted. This is suggestive of coronary disease or   a stress (Takotsubo-type) cardiomyopathy. Moderate diastolic   dysfunction. Normal RV size with mildly decreased systolic   function (with apical wall motion abnormality).  Assessment   Principal Problem:   Chest pain Active Problems:   Chronic diastolic CHF (congestive heart failure) ([place])  Poorly controlled type 2 diabetes mellitus (HCC)   CKD (chronic kidney disease), stage III ([place])   Plan   1. Feels much better today.  Breathing has improved significantly.  She says she is better than her recent baseline.  Ready to be discharged.  We will need to check a repeat metabolic profile.  Plan to start oral Lasix today.  Will arrange follow-up with her cardiologist and Dr. Caryl Comes for a pacemaker evaluation.  She may be a candidate for a CRT-P upgrade. NO ACE-I/ARB/Entresto d/t CKD.  Time Spent Directly with Patient:  I have spent a total of 15 minutes with the patient reviewing hospital notes, telemetry, EKGs, labs and examining the patient as well as establishing an assessment and plan that was discussed personally with the patient. > 50% of time was spent in direct patient care.  Length of Stay:  LOS: 3 days   Pixie Casino, MD, [place]  Attending Cardiologist  Direct Dial: [phone]  Fax: [phone]  Website:  [email or web address] 07/23/2017, 8:47 AM

## 2017-07-24 ENCOUNTER — Encounter (HOSPITAL_COMMUNITY): Payer: Self-pay | Admitting: Internal Medicine

## 2017-07-24 LAB — GLUCOSE, CAPILLARY: Glucose-Capillary: 234 mg/dL — ABNORMAL HIGH (ref 65–99)

## 2017-07-25 ENCOUNTER — Encounter: Payer: Self-pay | Admitting: Nurse Practitioner

## 2017-07-25 ENCOUNTER — Ambulatory Visit (INDEPENDENT_AMBULATORY_CARE_PROVIDER_SITE_OTHER): Payer: Medicare Other | Admitting: Nurse Practitioner

## 2017-07-25 VITALS — BP 107/60 | HR 82 | Ht 63.0 in | Wt 207.5 lb

## 2017-07-25 DIAGNOSIS — I214 Non-ST elevation (NSTEMI) myocardial infarction: Secondary | ICD-10-CM | POA: Diagnosis not present

## 2017-07-25 DIAGNOSIS — I428 Other cardiomyopathies: Secondary | ICD-10-CM

## 2017-07-25 DIAGNOSIS — N183 Chronic kidney disease, stage 3 unspecified: Secondary | ICD-10-CM

## 2017-07-25 DIAGNOSIS — I5022 Chronic systolic (congestive) heart failure: Secondary | ICD-10-CM

## 2017-07-25 DIAGNOSIS — I10 Essential (primary) hypertension: Secondary | ICD-10-CM

## 2017-07-25 DIAGNOSIS — I5042 Chronic combined systolic (congestive) and diastolic (congestive) heart failure: Secondary | ICD-10-CM

## 2017-07-25 NOTE — Patient Instructions (Signed)
Medication Instructions:  Your physician recommends that you continue on your current medications as directed. Please refer to the Current Medication list given to you today.   Labwork: BMET today  Testing/Procedures: none  Follow-Up: Your physician recommends that you schedule a follow-up appointment in: 3-4 weeks with Dr. Rockey Situ or Ignacia Bayley, NP.    Any Other Special Instructions Will Be Listed Below (If Applicable).     If you need a refill on your cardiac medications before your next appointment, please call your pharmacy.

## 2017-07-25 NOTE — Progress Notes (Signed)
Office Visit    Patient Name: Teresa Mercado Date of Encounter: 07/25/2017  Primary Care Provider:  Idelle Crouch, MD Primary Cardiologist:  Johnny Bridge, MD   Chief Complaint    71 year old female with prior history of diastolic dysfunction, COPD, stage III chronic kidney disease, diabetes, and complete heart block with syncope requiring pacemaker placement in 2010, who presents for follow-up after recent hospitalization.  Past Medical History    Past Medical History:  Diagnosis Date  . Chronic combined systolic (congestive) and diastolic (congestive) heart failure ([place])    a. 09/2015 Echo: normal EF 55%; b. 07/2017 Echo: EF 25%, Gr2 DD, mild lvh, mid ant/inf/infsept/apical AK. Triv MR. mildly to mod dil LA.  . CKD (chronic kidney disease), stage III ([place])   . COPD (chronic obstructive pulmonary disease) (HCC)    Not on home o2  . Diabetes mellitus without complication ([place])    On Levemir  . Non-ischemic cardiomyopathy ([place])    a. 07/2017 Echo: EF 25%, Gr2 DD - ? takotsubo.  . Non-obstructive CAD (coronary artery disease)    a. 07/2017 Cath: LM nl, LAD 40ost, D1 50, D2 40, LCX 27m, OM1/2 nl, RCA large, nl.  . Syncope    a.  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   Past Surgical History:  Procedure Laterality Date  . APPENDECTOMY    . PACEMAKER INSERTION    . TONSILLECTOMY    . TUBAL LIGATION      Allergies  Allergies  Allergen Reactions  . Quinapril Other (See Comments)    Reaction:  Elevated potassium   . Venlafaxine Other (See Comments)    Reaction:  GI upset   . Zetia [Ezetimibe] Nausea And Vomiting    History of Present Illness    71 year old female with the above complex past medical history including diastolic heart failure, COPD, diabetes, stage III chronic kidney disease, history of GI bleed, and complete heart block.  She was recently admitted to [facility] with chest pain and troponin elevation and was subsequently  transferred to [facility] for further evaluation and diuresis.  Echocardiogram showed newly reduced LV function with an EF of 25% and multiple wall motion abnormalities.  Catheterization showed mild, nonobstructive CAD and was felt that presentation might be consistent with a stress-induced cardiomyopathy.  She was aggressively diuresed with about a 14 pound weight loss throughout her admission.  She was just discharged home a few days ago.  Since discharge, she has been weighing herself daily and doing well.  Weight has been stable at 204 pounds.  She denies PND, orthopnea, dizziness, syncope, edema, or early satiety.  She has not had any recurrence of chest pain.  Home Medications    Prior to Admission medications   Medication Sig Start Date End Date Taking? Authorizing Provider  albuterol (PROVENTIL HFA;VENTOLIN HFA) 108 (90 Base) MCG/ACT inhaler Inhale 1-2 puffs into the lungs every 6 (six) hours as needed for wheezing or shortness of breath.   Yes [provider]  aspirin 81 MG EC tablet Take 81 mg by mouth daily. Swallow whole.   Yes [provider]  atorvastatin (LIPITOR) 80 MG tablet Take 80 mg by mouth daily. 07/19/17  Yes [provider]  carvedilol (COREG) 3.125 MG tablet Take 1 tablet (3.125 mg total) 2 (two) times daily with a meal by mouth. 07/23/17  Yes Daune Perch, NP  cloNIDine (CATAPRES) 0.1 MG tablet Take 0.1 mg by mouth 2 (two)  times daily.   Yes [provider]  esomeprazole (NEXIUM) 40 MG capsule Take 1 capsule (40 mg total) by mouth daily. 05/25/17  Yes Gollan, Kathlene November, MD  furosemide (LASIX) 40 MG tablet Take 1 tablet (40 mg total) daily by mouth. 07/23/17  Yes Daune Perch, NP  glimepiride (AMARYL) 4 MG tablet Take 4 mg by mouth daily with breakfast.   Yes [provider]  insulin aspart (NOVOLOG) 100 UNIT/ML FlexPen Inject 16 Units into the skin daily with lunch.    Yes [provider]  insulin detemir (LEVEMIR) 100  UNIT/ML injection Inject 40 Units into the skin at bedtime.    Yes [provider]  metFORMIN (GLUCOPHAGE) 1000 MG tablet Take 1,000 mg by mouth 2 (two) times daily with a meal. 07/19/17 07/19/18 Yes [provider]  pioglitazone (ACTOS) 45 MG tablet Take 45 mg by mouth daily.   Yes [provider]  vitamin B-12 (CYANOCOBALAMIN) 1000 MCG tablet Take 1,000 mcg by mouth daily.   Yes [provider]    Review of Systems    Since discharge, She denies chest pain, palpitations, dyspnea, pnd, orthopnea, n, v, dizziness, syncope, edema, weight gain, or early satiety.  All other systems reviewed and are otherwise negative except as noted above.  Physical Exam    VS:  BP 107/60 (BP Location: Left Arm, Patient Position: Sitting, Cuff Size: Normal)   Pulse 82   Ht 5\' 3"  (1.6 m)   Wt 207 lb 8 oz (94.1 kg)   BMI 36.76 kg/m  , BMI Body mass index is 36.76 kg/m. GEN: Well nourished, well developed, in no acute distress.  HEENT: normal.  Neck: Supple, no JVD, carotid bruits, or masses. Cardiac: RRR, no murmurs, rubs, or gallops. No clubbing, cyanosis, edema.  Radials/DP/PT 2+ and equal bilaterally.  Right wrist catheterization site without bleeding, bruit, or hematoma. Respiratory:  Respirations regular and unlabored, clear to auscultation bilaterally. GI: Soft, nontender, nondistended, BS + x 4. MS: no deformity or atrophy. Skin: warm and dry, no rash. Neuro:  Strength and sensation are intact. Psych: Normal affect.  Accessory Clinical Findings    ECG -a sensed, V paced, 82, no acute changes.  Assessment & Plan    1.  Non-STEMI, subsequent episode of care/nonobstructive CAD: Patient recently admitted with chest pain, heart failure, and troponin elevation with finding of LV dysfunction.  Catheterization revealed minimal nonobstructive CAD.  Plan for medical therapy in the setting of presumed stress-induced cardiomyopathy.  She remains on aspirin, statin,  beta-blocker.  She had previously been on lisinopril but this was discontinued during hospitalization in the setting of renal insufficiency and requirement for contrast at the time of catheterization.  Plan to follow-up a basic metabolic panel today and consider resumption of ACE inhibitor therapy if stable.  2.  Chronic combined systolic and diastolic congestive heart failure/nonischemic cardiomyopathy: See #1.  She is doing well since discharge and weight has been stable.  Continue beta-blocker.  Follow-up basic metabolic panel today.  Consider resuming lisinopril if renal function stable.  Plan to follow-up echo in approximately 6 weeks after optimization of medical therapy to reevaluate LV function and to determine candidacy for ICD therapy.  We discussed the importance of daily weights, sodium restriction, medication compliance, and symptom reporting and she verbalizes understanding.   3.  Stage III chronic kidney disease: Follow-up basic metabolic panel today and consider resumption of lisinopril.  4.  Essential hypertension: Stable.  5.  Hyperlipidemia: Continue statin therapy.  6.  Diabetes mellitus: Continue current therapy.  7.  Disposition: Follow-up basic metabolic panel today.  Follow-up in clinic in approximately 3 weeks to reassess clinical status.  Murray Hodgkins, NP 07/25/2017, 7:02 PM

## 2017-07-26 LAB — BASIC METABOLIC PANEL
BUN/Creatinine Ratio: 21 (ref 12–28)
BUN: 38 mg/dL — ABNORMAL HIGH (ref 8–27)
CALCIUM: 9.7 mg/dL (ref 8.7–10.3)
CO2: 25 mmol/L (ref 20–29)
CREATININE: 1.8 mg/dL — AB (ref 0.57–1.00)
Chloride: 98 mmol/L (ref 96–106)
GFR calc Af Amer: 32 mL/min/{1.73_m2} — ABNORMAL LOW (ref >59)
GFR, EST NON AFRICAN AMERICAN: 28 mL/min/{1.73_m2} — AB (ref >59)
Glucose: 71 mg/dL (ref 65–99)
Potassium: 5.4 mmol/L — ABNORMAL HIGH (ref 3.5–5.2)
SODIUM: 142 mmol/L (ref 134–144)

## 2017-07-27 ENCOUNTER — Telehealth: Payer: Self-pay | Admitting: Cardiovascular Disease

## 2017-07-27 ENCOUNTER — Other Ambulatory Visit: Payer: Self-pay

## 2017-07-27 DIAGNOSIS — E875 Hyperkalemia: Secondary | ICD-10-CM

## 2017-07-27 NOTE — Addendum Note (Signed)
Addended by: Britt Bottom on: 07/27/2017 08:29 AM   Modules accepted: Orders

## 2017-07-27 NOTE — Telephone Encounter (Signed)
Reviewed labs w/pt. See results note

## 2017-07-27 NOTE — Telephone Encounter (Signed)
Patient would like results of recent cath please call

## 2017-08-04 ENCOUNTER — Encounter (HOSPITAL_COMMUNITY)
Admission: RE | Admit: 2017-08-04 | Discharge: 2017-08-04 | Disposition: A | Discharge status: Home / Self Care | Payer: Medicare Other | Source: Ambulatory Visit | Attending: Cardiovascular Disease | Admitting: Cardiovascular Disease

## 2017-08-04 VITALS — BP 128/54 | HR 78 | Ht 63.0 in | Wt 207.0 lb

## 2017-08-04 DIAGNOSIS — I5032 Chronic diastolic (congestive) heart failure: Secondary | ICD-10-CM | POA: Insufficient documentation

## 2017-08-04 DIAGNOSIS — I214 Non-ST elevation (NSTEMI) myocardial infarction: Secondary | ICD-10-CM

## 2017-08-04 NOTE — Progress Notes (Signed)
Daily Session Note  Patient Details  Name: Teresa Mercado MRN: [id number] Date of Birth: 01-06-1946 Referring Provider:     [place] from 08/04/2017 in [place]  Referring Provider  Dr. Rockey Situ      Encounter Date: 08/04/2017  Check In: Session Check In - 08/04/17 1230      Check-In   Location  [facility]    Staff Present  Taesha Goodell Angelina Pih, MS, EP, [facility], Exercise Physiologist;Gregory Luther Parody, BS, EP, Exercise Physiologist;Debra Wynetta Emery, RN, BSN    Supervising physician immediately available to respond to emergencies  See telemetry face sheet for immediately available MD    Medication changes reported      No    Fall or balance concerns reported     No    Tobacco Cessation  -- Quit 2007    Warm-up and Cool-down  Performed as group-led instruction    Resistance Training Performed  Yes    VAD Patient?  No      Pain Assessment   Currently in Pain?  No/denies    Pain Score  0-No pain    Multiple Pain Sites  No       Capillary Blood Glucose: No results found for this or any previous visit (from the past 24 hour(s)).    Social History   Tobacco Use  Smoking Status Former Smoker  . Packs/day: 2.00  . Types: Cigarettes  . Last attempt to quit: 11/24/2005  . Years since quitting: 11.7  Smokeless Tobacco Never Used  Tobacco Comment   Quit 10 years ago, but has 35 years smoking prior to that    Goals Met:  Independence with exercise equipment Improved SOB with ADL's Exercise tolerated well No report of cardiac concerns or symptoms Strength training completed today  Goals Unmet:  Not Applicable  Comments: Check out: 1500   Dr. Kate Sable is Medical Director for [place] and Pulmonary Rehab.

## 2017-08-04 NOTE — Progress Notes (Signed)
Cardiac/Pulmonary Rehab Medication Review by a Pharmacist  Does the patient  feel that his/her medications are working for him/her?  yes  Has the patient been experiencing any side effects to the medications prescribed?  no  Does the patient measure his/her own blood pressure or blood glucose at home?  yes   Does the patient have any problems obtaining medications due to transportation or finances?   no  Understanding of regimen: fair Understanding of indications: fair Potential of compliance: excellent  Questions asked to Determine Patient Understanding of Medication Regimen:  1. What is the name of the medication?  2. What is the medication used for?  3. When should it be taken?  4. How much should be taken?  5. How will you take it?  6. What side effects should you report?  Understanding Defined as: Excellent: All questions above are correct Good: Questions 1-4 are correct Fair: Questions 1-2 are correct  Poor: 1 or none of the above questions are correct   Pharmacist comments: 71 yo female presents to clinic for cardiac rehab. Attempted to review medication list with patient but she was unsure of a couple them and will bring her med list Monday.  I encouraged her to keep in her wallet for "just in case".  She monitors her Blood sugars but not her blood pressure. I suggested she purchase one and monitor BP and HR daily and when she doesn't feel just right. She is compliant with her medications and is tolerating them.   Thanks for the opportunity to participate in the care of this patient,  Isac Sarna, BS [place]ena [place], [place] Pharmacist Pager [phone] 08/04/2017 2:28 PM

## 2017-08-04 NOTE — Progress Notes (Signed)
Cardiac Individual Treatment Plan  Patient Details  Name: Teresa Mercado MRN: [id number] Date of Birth: 05-Jan-1946 Referring Provider:     CARDIAC REHAB [place] from 08/04/2017 in [place]  Referring Provider  Dr. Rockey Situ      Initial Encounter Date:    CARDIAC REHAB PHASE II ORIENTATION from 08/04/2017 in [place]  Date  08/04/17  Referring Provider  Dr. Rockey Situ      Visit Diagnosis: NSTEMI (non-ST elevated myocardial infarction) [facility])  CHF (congestive heart failure), NYHA class I, chronic, diastolic ([place])  Patient's Home Medications on Admission:  Current Outpatient Medications:  .  ipratropium-albuterol (DUONEB) 0.5-2.5 (3) MG/3ML SOLN, Inhale 3 mLs every 3 (three) hours as needed into the lungs., Disp: , Rfl:  .  albuterol (PROVENTIL HFA;VENTOLIN HFA) 108 (90 Base) MCG/ACT inhaler, Inhale 1-2 puffs into the lungs every 6 (six) hours as needed for wheezing or shortness of breath., Disp: , Rfl:  .  aspirin 81 MG EC tablet, Take 81 mg by mouth daily. Swallow whole., Disp: , Rfl:  .  atorvastatin (LIPITOR) 80 MG tablet, Take 80 mg by mouth daily., Disp: , Rfl:  .  carvedilol (COREG) 3.125 MG tablet, Take 1 tablet (3.125 mg total) 2 (two) times daily with a meal by mouth., Disp: 30 tablet, Rfl: 2 .  cloNIDine (CATAPRES) 0.1 MG tablet, Take 0.1 mg by mouth 2 (two) times daily., Disp: , Rfl:  .  digoxin (LANOXIN) 0.25 MG tablet, Take 0.125 mg daily by mouth., Disp: , Rfl:  .  esomeprazole (NEXIUM) 40 MG capsule, Take 1 capsule (40 mg total) by mouth daily., Disp: 90 capsule, Rfl: 3 .  furosemide (LASIX) 40 MG tablet, Take 1 tablet (40 mg total) daily by mouth., Disp: 30 tablet, Rfl: 2 .  glimepiride (AMARYL) 4 MG tablet, Take 4 mg by mouth daily with breakfast., Disp: , Rfl:  .  insulin aspart (NOVOLOG) 100 UNIT/ML FlexPen, Inject 16 Units into the skin daily with lunch. , Disp: , Rfl:  .  insulin detemir (LEVEMIR) 100 UNIT/ML  injection, Inject 40 Units into the skin at bedtime. , Disp: , Rfl:  .  metFORMIN (GLUCOPHAGE) 1000 MG tablet, Take 1,000 mg by mouth 2 (two) times daily with a meal., Disp: , Rfl:  .  pioglitazone (ACTOS) 45 MG tablet, Take 45 mg by mouth daily., Disp: , Rfl:  .  vitamin B-12 (CYANOCOBALAMIN) 1000 MCG tablet, Take 1,000 mcg by mouth daily., Disp: , Rfl:   Past Medical History: Past Medical History:  Diagnosis Date  . Chronic combined systolic (congestive) and diastolic (congestive) heart failure ([place])    a. 09/2015 Echo: normal EF 55%; b. 07/2017 Echo: EF 25%, Gr2 DD, mild lvh, mid ant/inf/infsept/apical AK. Triv MR. mildly to mod dil LA.  . CKD (chronic kidney disease), stage III ([place])   . COPD (chronic obstructive pulmonary disease) ([facility])    Not on home o2  . Diabetes mellitus without complication ([place])    On Levemir  . Non-ischemic cardiomyopathy ([place])    a. 07/2017 Echo: EF 25%, Gr2 DD - ? takotsubo.  . Non-obstructive CAD (coronary artery disease)    a. 07/2017 Cath: LM nl, LAD 40ost, D1 50, D2 40, LCX 23m, OM1/2 nl, RCA large, nl.  . Syncope    a.  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    Tobacco Use: Social History   Tobacco Use  Smoking Status Former Smoker  . Packs/day:  2.00  . Types: Cigarettes  . Last attempt to quit: 11/24/2005  . Years since quitting: 11.7  Smokeless Tobacco Never Used  Tobacco Comment   Quit 10 years ago, but has 53 years smoking prior to that    Labs: Recent Review Flowsheet Data    Labs for ITP Cardiac and Pulmonary Rehab Latest Ref Rng & Units 11/26/2015 07/04/2017 07/20/2017   Hemoglobin A1c 4.8 - 5.6 % 6.6(H) 7.9(H) 7.5(H)      Capillary Blood Glucose: Lab Results  Component Value Date   GLUCAP 169 (H) 07/23/2017   GLUCAP 269 (H) 07/22/2017   GLUCAP 225 (H) 07/22/2017   GLUCAP 234 (H) 07/22/2017   GLUCAP 166 (H) 07/22/2017     Exercise Target Goals: Date: 08/04/17  Exercise Program Goal: Individual  exercise prescription set with THRR, safety & activity barriers. Participant demonstrates ability to understand and report RPE using BORG scale, to self-measure pulse accurately, and to acknowledge the importance of the exercise prescription.  Exercise Prescription Goal: Starting with aerobic activity 30 plus minutes a day, 3 days per week for initial exercise prescription. Provide home exercise prescription and guidelines that participant acknowledges understanding prior to discharge.  Activity Barriers & Risk Stratification: Activity Barriers & Cardiac Risk Stratification - 08/04/17 1409      Activity Barriers & Cardiac Risk Stratification   Activity Barriers  Deconditioning;Muscular Weakness;Shortness of Breath    Cardiac Risk Stratification  High       6 Minute Walk: 6 Minute Walk    Row Name 08/04/17 1408         6 Minute Walk   Phase  Initial     Distance  800 feet     Distance % Change  0 %     Distance Feet Change  0 ft     Walk Time  6 minutes     # of Rest Breaks  0     MPH  1.51     METS  2.16     RPE  15     Perceived Dyspnea   13     VO2 Peak  6.59     Symptoms  No     Resting HR  78 bpm     Resting BP  128/54     Resting Oxygen Saturation   97 %     Exercise Oxygen Saturation  during 6 min walk  84 %     Max Ex. HR  120 bpm     Max Ex. BP  166/84     2 Minute Post BP  140/80        Oxygen Initial Assessment: Oxygen Initial Assessment - 08/04/17 1505      Home Oxygen   Home Oxygen Device  Home Concentrator    Sleep Oxygen Prescription  Continuous    Liters per minute  2    Home Exercise Oxygen Prescription  None    Home at Rest Exercise Oxygen Prescription  None    Compliance with Home Oxygen Use  Yes      Initial 6 min Walk   Oxygen Used  None      Program Oxygen Prescription   Program Oxygen Prescription  None       Oxygen Re-Evaluation:   Oxygen Discharge (Final Oxygen Re-Evaluation):   Initial Exercise Prescription: Initial  Exercise Prescription - 08/04/17 1400      Date of Initial Exercise RX and Referring Provider   Date  08/04/17  Referring Provider  Dr. Rockey Situ      Treadmill   MPH  1    Grade  0    Minutes  15    METs  1.7      NuStep   Level  2    SPM  57    Minutes  20    METs  1.6      Prescription Details   Frequency (times per week)  3    Duration  Progress to 30 minutes of continuous aerobic without signs/symptoms of physical distress      Intensity   THRR 40-80% of Max Heartrate  106-120-134    Ratings of Perceived Exertion  11-13    Perceived Dyspnea  0-4      Progression   Progression  Continue progressive overload as per policy without signs/symptoms or physical distress.      Resistance Training   Training Prescription  Yes    Weight  1    Reps  10-15       Perform Capillary Blood Glucose checks as needed.  Exercise Prescription Changes:   Exercise Comments:   Exercise Goals and Review:  Exercise Goals    Row Name 08/04/17 1410             Exercise Goals   Increase Physical Activity  Yes       Intervention  Provide advice, education, support and counseling about physical activity/exercise needs.;Develop an individualized exercise prescription for aerobic and resistive training based on initial evaluation findings, risk stratification, comorbidities and participant's personal goals.       Expected Outcomes  Achievement of increased cardiorespiratory fitness and enhanced flexibility, muscular endurance and strength shown through measurements of functional capacity and personal statement of participant.       Increase Strength and Stamina  Yes       Intervention  Provide advice, education, support and counseling about physical activity/exercise needs.;Develop an individualized exercise prescription for aerobic and resistive training based on initial evaluation findings, risk stratification, comorbidities and participant's personal goals.       Expected Outcomes   Achievement of increased cardiorespiratory fitness and enhanced flexibility, muscular endurance and strength shown through measurements of functional capacity and personal statement of participant.       Able to understand and use rate of perceived exertion (RPE) scale  Yes       Intervention  Provide education and explanation on how to use RPE scale       Expected Outcomes  Long Term:  Able to use RPE to guide intensity level when exercising independently;Short Term: Able to use RPE daily in rehab to express subjective intensity level       Able to understand and use Dyspnea scale  Yes       Intervention  Provide education and explanation on how to use Dyspnea scale       Expected Outcomes  Short Term: Able to use Dyspnea scale daily in rehab to express subjective sense of shortness of breath during exertion;Long Term: Able to use Dyspnea scale to guide intensity level when exercising independently       Knowledge and understanding of Target Heart Rate Range (THRR)  Yes       Intervention  Provide education and explanation of THRR including how the numbers were predicted and where they are located for reference       Expected Outcomes  Short Term: Able to state/look up THRR       Able  to check pulse independently  Yes       Intervention  Provide education and demonstration on how to check pulse in carotid and radial arteries.;Review the importance of being able to check your own pulse for safety during independent exercise       Expected Outcomes  Short Term: Able to explain why pulse checking is important during independent exercise;Long Term: Able to check pulse independently and accurately       Understanding of Exercise Prescription  Yes       Intervention  Provide education, explanation, and written materials on patient's individual exercise prescription       Expected Outcomes  Short Term: Able to explain program exercise prescription;Long Term: Able to explain home exercise prescription to  exercise independently          Exercise Goals Re-Evaluation :    Discharge Exercise Prescription (Final Exercise Prescription Changes):   Nutrition:  Target Goals: Understanding of nutrition guidelines, daily intake of sodium 1500mg , cholesterol 200mg , calories 30% from fat and 7% or less from saturated fats, daily to have 5 or more servings of fruits and vegetables.  Biometrics: Pre Biometrics - 08/04/17 1410      Pre Biometrics   Height  5\' 3"  (1.6 m)    Weight  207 lb (93.9 kg)    Waist Circumference  47.5 inches    Hip Circumference  46.5 inches    Waist to Hip Ratio  1.02 %    BMI (Calculated)  36.68    Triceps Skinfold  20 mm    % Body Fat  47 %    Grip Strength  55.93 kg    Flexibility  13.16 in    Single Leg Stand  1 seconds        Nutrition Therapy Plan and Nutrition Goals: Nutrition Therapy & Goals - 08/04/17 1509      Personal Nutrition Goals   Personal Goal #2  Low sodium, low potassium    Additional Goals?  No       Nutrition Discharge: Rate Your Plate Scores: Nutrition Assessments - 08/04/17 1511      MEDFICTS Scores   Pre Score  26       Nutrition Goals Re-Evaluation:   Nutrition Goals Discharge (Final Nutrition Goals Re-Evaluation):   Psychosocial: Target Goals: Acknowledge presence or absence of significant depression and/or stress, maximize coping skills, provide positive support system. Participant is able to verbalize types and ability to use techniques and skills needed for reducing stress and depression.  Initial Review & Psychosocial Screening: Initial Psych Review & Screening - 08/04/17 1513      Initial Review   Current issues with  None Identified      Family Dynamics   Good Support System?  Yes      Barriers   Psychosocial barriers to participate in program  There are no identifiable barriers or psychosocial needs.      Screening Interventions   Interventions  Encouraged to exercise       Quality of Life  Scores: Quality of Life - 08/04/17 1412      Quality of Life Scores   Health/Function Pre  23.08 %    Socioeconomic Pre  25.71 %    Psych/Spiritual Pre  27.43 %    Family Pre  30 %    GLOBAL Pre  25.69 %       PHQ-9: Recent Review Flowsheet Data    Depression screen [facility] 2/9 08/04/2017   Decreased Interest  0   Down, Depressed, Hopeless 0   PHQ - 2 Score 0   Altered sleeping 1   Tired, decreased energy 1   Change in appetite 0   Feeling bad or failure about yourself  0   Trouble concentrating 0   Moving slowly or fidgety/restless 0   Suicidal thoughts 0   PHQ-9 Score 2   Difficult doing work/chores Somewhat difficult     Interpretation of Total Score  Total Score Depression Severity:  1-4 = Minimal depression, 5-9 = Mild depression, 10-14 = Moderate depression, 15-19 = Moderately severe depression, 20-27 = Severe depression   Psychosocial Evaluation and Intervention: Psychosocial Evaluation - 08/04/17 1514      Psychosocial Evaluation & Interventions   Interventions  Encouraged to exercise with the program and follow exercise prescription    Continue Psychosocial Services   No Follow up required       Psychosocial Re-Evaluation: Psychosocial Re-Evaluation    [place] Name 08/04/17 1514             Psychosocial Re-Evaluation   Current issues with  None Identified       Continue Psychosocial Services   No Follow up required          Psychosocial Discharge (Final Psychosocial Re-Evaluation): Psychosocial Re-Evaluation - 08/04/17 1514      Psychosocial Re-Evaluation   Current issues with  None Identified    Continue Psychosocial Services   No Follow up required       Vocational Rehabilitation: Provide vocational rehab assistance to qualifying candidates.   Vocational Rehab Evaluation & Intervention: Vocational Rehab - 08/04/17 1502      Initial Vocational Rehab Evaluation & Intervention   Assessment shows need for Vocational Rehabilitation  No        Education: Education Goals: Education classes will be provided on a weekly basis, covering required topics. Participant will state understanding/return demonstration of topics presented.  Learning Barriers/Preferences: Learning Barriers/Preferences - 08/04/17 1502      Learning Barriers/Preferences   Learning Barriers  None    Learning Preferences  Video;Written Material;Pictoral       Education Topics: Hypertension, Hypertension Reduction -Define heart disease and high blood pressure. Discus how high blood pressure affects the body and ways to reduce high blood pressure.   Exercise and Your Heart -Discuss why it is important to exercise, the FITT principles of exercise, normal and abnormal responses to exercise, and how to exercise safely.   Angina -Discuss definition of angina, causes of angina, treatment of angina, and how to decrease risk of having angina.   Cardiac Medications -Review what the following cardiac medications are used for, how they affect the body, and side effects that may occur when taking the medications.  Medications include Aspirin, Beta blockers, calcium channel blockers, ACE Inhibitors, angiotensin receptor blockers, diuretics, digoxin, and antihyperlipidemics.   Congestive Heart Failure -Discuss the definition of CHF, how to live with CHF, the signs and symptoms of CHF, and how keep track of weight and sodium intake.   Heart Disease and Intimacy -Discus the effect sexual activity has on the heart, how changes occur during intimacy as we age, and safety during sexual activity.   Smoking Cessation / COPD -Discuss different methods to quit smoking, the health benefits of quitting smoking, and the definition of COPD.   Nutrition I: Fats -Discuss the types of cholesterol, what cholesterol does to the heart, and how cholesterol levels can be controlled.   Nutrition II: Labels -Discuss the different components  of food labels and how to read food  label   Heart Parts and Heart Disease -Discuss the anatomy of the heart, the pathway of blood circulation through the heart, and these are affected by heart disease.   Stress I: Signs and Symptoms -Discuss the causes of stress, how stress may lead to anxiety and depression, and ways to limit stress.   Stress II: Relaxation -Discuss different types of relaxation techniques to limit stress.   Warning Signs of Stroke / TIA -Discuss definition of a stroke, what the signs and symptoms are of a stroke, and how to identify when someone is having stroke.   Knowledge Questionnaire Score: Knowledge Questionnaire Score - 08/04/17 1502      Knowledge Questionnaire Score   Pre Score  21/24       Core Components/Risk Factors/Patient Goals at Admission: Personal Goals and Risk Factors at Admission - 08/04/17 1511      Core Components/Risk Factors/Patient Goals on Admission    Weight Management  Weight Maintenance    Improve shortness of breath with ADL's  Yes    Intervention  Provide education, individualized exercise plan and daily activity instruction to help decrease symptoms of SOB with activities of daily living.    Expected Outcomes  Short Term: Achieves a reduction of symptoms when performing activities of daily living.    Personal Goal Other  Yes    Personal Goal  Improve my health, live longer    Intervention  Attend CR 3 x week and supplement with home exercise 2 x week    Expected Outcomes  achieve personal goals.       Core Components/Risk Factors/Patient Goals Review:    Core Components/Risk Factors/Patient Goals at Discharge (Final Review):    ITP Comments: ITP Comments    Row Name 08/04/17 1503           ITP Comments  Mrs. Lunt is a pleasant 71 year old woman. She is very interested in being in our program. She has COPD w/emphysema. She only uses O2 at night during sleep at 2L.           Comments: Patient arrived for 1st visit/orientation/education at  12:30. Patient was referred to CR by Dr. Rockey Situ due to NSTEMI (I21.40 and CHF (I50.32). During orientation advised patient on arrival and appointment times what to wear, what to do before, during and after exercise. Reviewed attendance and class policy. Talked about inclement weather and class consultation policy. Pt is scheduled to return Cardiac Rehab on 08/07/17 at 1545. Pt was advised to come to class 15 minutes before class starts. Patient was also given instructions on meeting with the dietician and attending the Family Structure classes. Discussed RPE/Dpysnea scales. Discussed initial THR and how to find their radial and/or carotid pulse. Discussed the initial exercise prescription and how this effects their progress. Pt is eager to get started. Patient participated in warm up stretches followed by light weights and resistance bands. Patient was able to complete 6 minute walk test. Patient had to pause multiple times during test due to fatigue and SOB. Patient did not c/o pain. Patient was measured for the equipment. Discussed equipment safety with patient. Took patient pre-anthropometric measurements. Patient finished visit at 1500.

## 2017-08-07 ENCOUNTER — Encounter (HOSPITAL_COMMUNITY)
Admission: RE | Admit: 2017-08-07 | Discharge: 2017-08-07 | Disposition: A | Discharge status: Home / Self Care | Payer: Medicare Other | Source: Ambulatory Visit | Attending: Cardiovascular Disease | Admitting: Cardiovascular Disease

## 2017-08-07 DIAGNOSIS — I214 Non-ST elevation (NSTEMI) myocardial infarction: Secondary | ICD-10-CM

## 2017-08-07 DIAGNOSIS — I5032 Chronic diastolic (congestive) heart failure: Secondary | ICD-10-CM | POA: Diagnosis not present

## 2017-08-07 NOTE — Progress Notes (Signed)
Daily Session Note  Patient Details  Name: Evalynn Hankins MRN: [id number] Date of Birth: 08-21-1946 Referring Provider:     [place] from 08/04/2017 in [place]  Referring Provider  Dr. Rockey Situ      Encounter Date: 08/07/2017  Check In: Session Check In - 08/07/17 1545      Check-In   Location  [facility]    Staff Present  Aundra Dubin, RN, BSN;Diane Coad, MS, EP, [facility], Exercise Physiologist    Supervising physician immediately available to respond to emergencies  See telemetry face sheet for immediately available MD    Medication changes reported      No    Fall or balance concerns reported     No    Warm-up and Cool-down  Performed as group-led instruction    Resistance Training Performed  Yes    VAD Patient?  No      Pain Assessment   Currently in Pain?  No/denies    Pain Score  0-No pain    Multiple Pain Sites  No       Capillary Blood Glucose: No results found for this or any previous visit (from the past 24 hour(s)).    Social History   Tobacco Use  Smoking Status Former Smoker  . Packs/day: 2.00  . Types: Cigarettes  . Last attempt to quit: 11/24/2005  . Years since quitting: 11.7  Smokeless Tobacco Never Used  Tobacco Comment   Quit 10 years ago, but has 66 years smoking prior to that    Goals Met:  Independence with exercise equipment Exercise tolerated well No report of cardiac concerns or symptoms Strength training completed today  Goals Unmet:  Not Applicable  Comments: Check out 1645.   Dr. Kate Sable is Medical Director for [facility] Cardiac and Pulmonary Rehab.

## 2017-08-08 ENCOUNTER — Ambulatory Visit (INDEPENDENT_AMBULATORY_CARE_PROVIDER_SITE_OTHER): Payer: Medicare Other | Admitting: Internal Medicine

## 2017-08-08 ENCOUNTER — Encounter: Payer: Self-pay | Admitting: Internal Medicine

## 2017-08-08 VITALS — BP 120/60 | HR 72 | Ht 63.0 in | Wt 206.8 lb

## 2017-08-08 DIAGNOSIS — E875 Hyperkalemia: Secondary | ICD-10-CM | POA: Diagnosis not present

## 2017-08-08 DIAGNOSIS — I442 Atrioventricular block, complete: Secondary | ICD-10-CM | POA: Diagnosis not present

## 2017-08-08 DIAGNOSIS — Z950 Presence of cardiac pacemaker: Secondary | ICD-10-CM

## 2017-08-08 LAB — CUP PACEART INCLINIC DEVICE CHECK
Battery Impedance: 182 Ohm
Battery Voltage: 2.78 V
Brady Statistic AP VP Percent: 0 %
Brady Statistic AP VS Percent: 0 %
Brady Statistic AS VS Percent: 0 %
Implantable Lead Implant Date: 20091106
Implantable Lead Model: 5076
Lead Channel Impedance Value: 548 Ohm
Lead Channel Impedance Value: 640 Ohm
Lead Channel Pacing Threshold Pulse Width: 0.4 ms
Lead Channel Sensing Intrinsic Amplitude: 2.8 mV
Lead Channel Setting Pacing Amplitude: 2.5 V
MDC IDC LEAD IMPLANT DT: 20151221
MDC IDC LEAD LOCATION: 753859
MDC IDC LEAD LOCATION: 753860
MDC IDC MSMT BATTERY REMAINING LONGEVITY: 124 mo
MDC IDC MSMT LEADCHNL RA PACING THRESHOLD AMPLITUDE: 0.5 V
MDC IDC MSMT LEADCHNL RV PACING THRESHOLD AMPLITUDE: 0.5 V
MDC IDC MSMT LEADCHNL RV PACING THRESHOLD PULSEWIDTH: 0.4 ms
MDC IDC PG IMPLANT DT: 20151221
MDC IDC SESS DTM: 20181120135028
MDC IDC SET LEADCHNL RA PACING AMPLITUDE: 1.5 V
MDC IDC SET LEADCHNL RV PACING PULSEWIDTH: 0.4 ms
MDC IDC SET LEADCHNL RV SENSING SENSITIVITY: 2.8 mV
MDC IDC STAT BRADY AS VP PERCENT: 100 %

## 2017-08-08 NOTE — Patient Instructions (Addendum)
Medication Instructions:  Your physician recommends that you continue on your current medications as directed. Please refer to the Current Medication list given to you today.   Labwork: BMET toay  Testing/Procedures: Your physician has requested that you have an echocardiogram. Echocardiography is a painless test that uses sound waves to create images of your heart. It provides your doctor with information about the size and shape of your heart and how well your heart's chambers and valves are working. This procedure takes approximately one hour. There are no restrictions for this procedure.   Follow-Up: Your physician wants you to follow-up in: 1 year with Dr. Caryl Mercado.  You will receive a reminder letter in the mail two months in advance. If you don't receive a letter, please call our office to schedule the follow-up appointment.  Your physician wants you to follow-up device check in 6 months.  You will receive a reminder letter in the mail two months in advance. If you don't receive a letter, please call our office to schedule the follow-up appointment.    Any Other Special Instructions Will Be Listed Below (If Applicable).     If you need a refill on your cardiac medications before your next appointment, please call your pharmacy.  Echocardiogram An echocardiogram, or echocardiography, uses sound waves (ultrasound) to produce an image of your heart. The echocardiogram is simple, painless, obtained within a short period of time, and offers valuable information to your health care provider. The images from an echocardiogram can provide information such as:  Evidence of coronary artery disease (CAD).  Heart size.  Heart muscle function.  Heart valve function.  Aneurysm detection.  Evidence of a past heart attack.  Fluid buildup around the heart.  Heart muscle thickening.  Assess heart valve function.  Tell a health care provider about:  Any allergies you have.  All  medicines you are taking, including vitamins, herbs, eye drops, creams, and over-the-counter medicines.  Any problems you or family members have had with anesthetic medicines.  Any blood disorders you have.  Any surgeries you have had.  Any medical conditions you have.  Whether you are pregnant or may be pregnant. What happens before the procedure? No special preparation is needed. Eat and drink normally. What happens during the procedure?  In order to produce an image of your heart, gel will be applied to your chest and a wand-like tool (transducer) will be moved over your chest. The gel will help transmit the sound waves from the transducer. The sound waves will harmlessly bounce off your heart to allow the heart images to be captured in real-time motion. These images will then be recorded.  You may need an IV to receive a medicine that improves the quality of the pictures. What happens after the procedure? You may return to your normal schedule including diet, activities, and medicines, unless your health care provider tells you otherwise. This information is not intended to replace advice given to you by your health care provider. Make sure you discuss any questions you have with your health care provider. Document Released: 09/02/2000 Document Revised: 04/23/2016 Document Reviewed: 05/13/2013 Elsevier Interactive Patient Education  2017 [place], Adult Cardiomyopathy is a long-term (chronic) disease of the heart muscle. The disease makes the heart muscle thick, weak, or stiff. As a result, the heart works harder to pump blood. Over time, cardiomyopathy can lead to heart failure. There are several types of cardiomyopathy:  Dilated cardiomyopathy. This type causes the ventricles to become  weak and stretched out.  Hypertrophic cardiomyopathy. This type causes the heart muscle to thicken.  Restrictive cardiomyopathy. This type causes the heart muscle to become  stiff.  Ischemic cardiomyopathy. This type involves narrowing arteries that cause the walls of the heart to get thinner.  Peripartum cardiomyopathy. This type occurs during pregnancy or shortly after pregnancy.  What are the causes? This condition may be caused by:  A gene that is passed down (inherited) from a family member.  A medical condition that damages the heart, such as: ? Diabetes. ? High blood pressure. ? Viral infection of the heart. ? Heart attack. ? Coronary heart disease.  Alcoholism.  Using illegal drugs or some prescription medicines.  Pregnancy.  Your body absorbing and storing too much iron (hemochromatosis).  Autoimmune diseases, connective tissue diseases, endocrine diseases, and muscle diseases.  Cancer treatments.  Buildup of proteins in your organs (amyloidosis), or inflammation in your organs (sarcoidosis).  Often, the cause is not known. What increases the risk? This condition is more likely to develop in people who:  Have a family history of cardiomyopathy or other heart problems.  Are overweight or obese.  Use illegal drugs.  Abuse alcohol.  Have a medical condition that damages the heart.  What are the signs or symptoms? Symptoms of this condition include:  Shortness of breath, especially during activity.  Fatigue.  An irregular heartbeat and heart murmurs.  Dizziness.  Light-headedness.  Fainting.  Chest pain.  Coughing.  Swelling in the lower legs, ankles, feet, abdomen, and neck veins.  Often, people with this condition have no symptoms. How is this diagnosed? This condition is diagnosed based on:  Your symptoms and medical history.  A physical exam.  Tests.  Tests may include:  Blood tests.  Imaging studies of your heart, such as: ? X-rays. ? An echocardiogram. ? An MRI.  An electrocardiogram (ECG). This records your heart's electrical activity.  A test in which you wear a portable device (event  monitor) to record your heart's electrical activity while you go about your day.  A stress test. This monitors your heart's activity while exercising.  Cardiac catheterization. This procedure checks the blood pressure and blood flow in your heart.  An angiogram. This is an injection of dye into your arteries before imaging studies are taken.  Heart tissue biopsy. This removes a sample of heart tissue for examination.  How is this treated?  Treatment for this condition depends on the type of cardiomyopathy you have and the severity of your symptoms. If you do not have symptoms, you may not need treatment. If you need treatment, it may include:  Lifestyle changes, such as: ? Eating a heart-healthy diet that includes plenty of fruits, vegetables, and whole grains, and cutting down on salt (sodium). ? Maintaining a healthy weight, and losing weight, if needed. ? Getting regular exercise. ? Quitting smoking, if you smoke. ? Avoiding alcohol. ? Medicine to:  Lower your blood pressure.  Slow down your heart rate.  Keep your heart beating in a steady rhythm.  Clear excess fluids from your body.  Prevent blood clots.  Balance minerals (electrolytes) in your body and get rid of extra sodium in your body.  Reduce inflammation.  Strengthen your heartbeat. ? Surgery to:  Repair a defect.  Remove thickened tissue.  Destroy tissues in the area of abnormal electrical activity (ablation).  Implant a device to treat serious heart rhythm problems (implantable cardioverter-defibrillator, or ICD), or a pacemaker.  Replace your heart (heart  transplant) if all other treatments have failed (end stage).  Other treatments may include cardiac resynchronization therapy (CRT) or a left ventricular assist device (LVAD). Follow these instructions at home: Lifestyle  Eat a heart-healthy diet. Work with your health care provider or a registered dietitian to learn about healthy eating  options.  Maintain a healthy weight.  Stay physically active. Ask your health care provider to suggest some activities that are good for you.  Do not use any products that contain nicotine or tobacco, such as cigarettes and e-cigarettes. If you need help quitting, ask your health care provider.  Limit alcohol intake to no more than one drink per day for nonpregnant women and no more than two drinks per day for men. One drink equals 12 oz of beer, 5 oz of wine, or 1 oz of hard liquor.  Try to get at least 7 hours of sleep each night.  Find healthy ways to manage stress. General instructions  Take over-the-counter and prescription medicines only as told by your health care provider. Some medicines can be dangerous for your heart.  Tell all health care providers, including your dentist, that you have cardiomyopathy. When you visit the dentist or have surgery, ask your health care provider if you need antibiotics before having dental care or before the surgery.  Ask your health care provider if you should wear a medical identification bracelet. This may be important if you have a pacemaker or a defibrillator.  Make sure you get all recommended vaccinations and an annual flu shot.  Work closely with your health care provider to manage any long-lasting (chronic) conditions.  Keep all follow-up visits as told by your health care provider. This is important, even if you do not have any symptoms. Your health care provider may need to make sure your condition is not getting worse. How is this prevented? This condition cannot be prevented. Parents, siblings, and children of people with this condition may be at risk for the condition. It is a good idea for them to get screened for the condition because it is best when cardiomyopathy is found early. Screening is done with an ECG and echocardiogram. People who want to start a family may also want to meet with a genetic counselor to discuss the risk of  having a child with cardiomyopathy. Contact a health care provider if:  Your symptoms get worse.  You have new symptoms. Get help right away if:  You have severe chest pain.  You have shortness of breath.  You cough up pink, bubbly material.  You have sudden sweating.  You feel nauseous and you vomit.  You suddenly become light-headed or dizzy.  You feel your heart beating very quickly.  It feels like your heart is skipping beats. These symptoms may represent a serious problem that is an emergency. Do not wait to see if the symptoms will go away. Get medical help right away. Call your local emergency services (911 in the [place] Do not drive yourself to the hospital. This information is not intended to replace advice given to you by your health care provider. Make sure you discuss any questions you have with your health care provider. Document Released: 11/18/2004 Document Revised: 05/03/2016 Document Reviewed: 03/07/2016 Elsevier Interactive Patient Education  [employer].

## 2017-08-08 NOTE — Progress Notes (Signed)
Patient Care Team: Idelle Crouch, MD as PCP - General (Internal Medicine) Rockey Situ Kathlene November, MD as PCP - Cardiology (Cardiology) Minna Merritts, MD as Consulting Physician (Cardiology)   HPI  Teresa Mercado is a 71 y.o. 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 She underwent generator replacement 12/15.  She was recently admitted with chest pain  Tn 2.+  DATE TEST    1/17 Echo   EF 55 %   11/18 Cath    Mild nonobstructive disease  11/18 Echo EF 25%  ? Takotsubo/LAE      Date Cr K Hgb Dig   3/17      0.8   8/18 1.5 4.7 10.8    11/18 1.8 5.4 10.9 0.8     referred for consideration of CRT upgrade   She is able to ambulate on flat surfaces.  She does very little work or climbing.  She does not carry groceries.  She is going to physical therapy and enjoying it.  She does not has significant edema.  Does not have nocturnal dyspnea or orthopnea.  She does have sleep disordered breathing and significant daytime somnolence.  She has had problems with kidney function prompting the discontinuation of lisinopril.  Past Medical History:  Diagnosis Date  . Chronic combined systolic (congestive) and diastolic (congestive) heart failure ([place])    a. 09/2015 Echo: normal EF 55%; b. 07/2017 Echo: EF 25%, Gr2 DD, mild lvh, mid ant/inf/infsept/apical AK. Triv MR. mildly to mod dil LA.  . CKD (chronic kidney disease), stage III ([place])   . COPD (chronic obstructive pulmonary disease) ([facility])    Not on home o2  . Diabetes mellitus without complication ([place])    On Levemir  . Non-ischemic cardiomyopathy ([place])    a. 07/2017 Echo: EF 25%, Gr2 DD - ? takotsubo.  . Non-obstructive CAD (coronary artery disease)    a. 07/2017 Cath: LM nl, LAD 40ost, D1 50, D2 40, LCX 90m, OM1/2 nl, RCA large, nl.  . Syncope    a.  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    Past Surgical History:  Procedure  Laterality Date  . APPENDECTOMY    . ESOPHAGOGASTRODUODENOSCOPY (EGD) N/A 11/27/2015   Performed by Lollie Sails, MD at [facility]  . LEFT HEART CATH AND CORONARY ANGIOGRAPHY N/A 07/21/2017   Performed by [name], MD at [facility]  . PACEMAKER INSERTION    . TONSILLECTOMY    . TUBAL LIGATION      Current Outpatient Medications  Medication Sig Dispense Refill  . albuterol (PROVENTIL HFA;VENTOLIN HFA) 108 (90 Base) MCG/ACT inhaler Inhale 1-2 puffs into the lungs every 6 (six) hours as needed for wheezing or shortness of breath.    Marland Kitchen aspirin 81 MG EC tablet Take 81 mg by mouth daily. Swallow whole.    Marland Kitchen atorvastatin (LIPITOR) 80 MG tablet Take 80 mg by mouth daily.    . carvedilol (COREG) 3.125 MG tablet Take 1 tablet (3.125 mg total) 2 (two) times daily with a meal by mouth. 30 tablet 2  . cloNIDine (CATAPRES) 0.1 MG tablet Take 0.1 mg by mouth 2 (two) times daily.    Marland Kitchen esomeprazole (NEXIUM) 40 MG capsule Take 1 capsule (40 mg total) by mouth daily. 90 capsule 3  . furosemide (LASIX) 40 MG tablet Take 1 tablet (40 mg total) daily by mouth. [place]annin  tablet 2  . glimepiride (AMARYL) 4 MG tablet Take 4 mg by mouth daily with breakfast.    . insulin aspart (NOVOLOG) 100 UNIT/ML FlexPen Inject 16 Units into the skin daily with lunch.     . insulin detemir (LEVEMIR) 100 UNIT/ML injection Inject 40 Units into the skin at bedtime.     . metFORMIN (GLUCOPHAGE) 1000 MG tablet Take 1,000 mg by mouth 2 (two) times daily with a meal.    . pioglitazone (ACTOS) 45 MG tablet Take 45 mg by mouth daily.    . vitamin B-12 (CYANOCOBALAMIN) 1000 MCG tablet Take 1,000 mcg by mouth daily.     No current facility-administered medications for this visit.     Allergies  Allergen Reactions  . Quinapril Other (See Comments)    Reaction:  Elevated potassium   . Venlafaxine Other (See Comments)    Reaction:  GI upset   . Zetia [Ezetimibe] Nausea And Vomiting      Review of Systems  negative except from HPI and PMH  Physical Exam BP 120/60 (BP Location: Left Arm, Patient Position: Sitting, Cuff Size: Normal)   Pulse 72   Ht 5\' 3"  (1.6 m)   Wt 206 lb 12 oz (93.8 kg)   BMI 36.62 kg/m  Well developed and nourished in no acute distress HENT normal Neck supple with JVP-flat Clear Regular rate and rhythm, no murmurs or gallops Abd-soft with active BS No Clubbing cyanosis tr edema Skin-warm and dry A & Oriented  Grossly normal sensory and motor function   ECG demonstrates P synchronous pacing at 72  Assessment and  Plan  Pacemaker  Medtronic The patient's device was interrogated.  The information was reviewed. No changes were made in the programming.     Syncope  Complete heart block  DM   HFpEF/HFrEF  Sleep disordered breathing  Fatigue  Renal insufficiency grade 3 with diabetes  Hyperkalemia  Cardiomyopathy-?  Tako-Tsubo versus pacemaker  Patient is euvolemic.  Issue is whether her cardiomyopathy is pacemaker related or Tako-Tsubo.  Wall motion abnormalities would suggest the latter.  Resolution in the short-term would support that diagnosis.  Hence, we will plan to reevaluate her LVEF in about 3 months time  We will reevaluate her renal function and potassium.  In the event that she is not a candidate for ARB therapy I will ask Dr. Deidre Ala as to whether he would like to use hydralazine nitrates.  She has significant sleep disordered breathing and daytime somnolence.  With her fatigue, I think she needs a sleep study and I will reach out to her primary care doctor in this regard.  It may also be a manifestation of depression given the overall lethargy and irritability.  In this regard she is feeling better and is emotionally less negative since being in rehab    More than 50% of 45 min was spent in counseling related to the above

## 2017-08-09 ENCOUNTER — Encounter (HOSPITAL_COMMUNITY)
Admission: RE | Admit: 2017-08-09 | Discharge: 2017-08-09 | Disposition: A | Discharge status: Home / Self Care | Payer: Medicare Other | Source: Ambulatory Visit | Attending: Cardiovascular Disease | Admitting: Cardiovascular Disease

## 2017-08-09 DIAGNOSIS — I5032 Chronic diastolic (congestive) heart failure: Secondary | ICD-10-CM | POA: Diagnosis not present

## 2017-08-09 DIAGNOSIS — I214 Non-ST elevation (NSTEMI) myocardial infarction: Secondary | ICD-10-CM

## 2017-08-09 LAB — BASIC METABOLIC PANEL
BUN/Creatinine Ratio: 29 — ABNORMAL HIGH (ref 12–28)
BUN: 56 mg/dL — ABNORMAL HIGH (ref 8–27)
CALCIUM: 10.7 mg/dL — AB (ref 8.7–10.3)
CHLORIDE: 98 mmol/L (ref 96–106)
CO2: 26 mmol/L (ref 20–29)
Creatinine, Ser: 1.91 mg/dL — ABNORMAL HIGH (ref 0.57–1.00)
GFR calc Af Amer: 30 mL/min/{1.73_m2} — ABNORMAL LOW (ref >59)
GFR calc non Af Amer: 26 mL/min/{1.73_m2} — ABNORMAL LOW (ref >59)
GLUCOSE: 45 mg/dL — AB (ref 65–99)
POTASSIUM: 5.3 mmol/L — AB (ref 3.5–5.2)
SODIUM: 140 mmol/L (ref 134–144)

## 2017-08-09 NOTE — Progress Notes (Signed)
Daily Session Note  Patient Details  Name: Teresa Mercado MRN: [id number] Date of Birth: 06-03-1946 Referring Provider:     [facility] from 08/04/2017 in [place]  Referring Provider  Dr. Rockey Situ      Encounter Date: 08/09/2017  Check In: Session Check In - 08/09/17 1545      Check-In   Location  [facility]    Staff Present  Diane Angelina Pih, MS, EP, [facility], Exercise Physiologist;Donye Dauenhauer Wynetta Emery, RN, BSN    Supervising physician immediately available to respond to emergencies  See telemetry face sheet for immediately available MD    Medication changes reported      No    Fall or balance concerns reported     No    Warm-up and Cool-down  Performed as group-led instruction    Resistance Training Performed  Yes    VAD Patient?  No      Pain Assessment   Currently in Pain?  No/denies    Pain Score  0-No pain    Multiple Pain Sites  No       Capillary Blood Glucose: No results found for this or any previous visit (from the past 24 hour(s)).    Social History   Tobacco Use  Smoking Status Former Smoker  . Packs/day: 2.00  . Types: Cigarettes  . Last attempt to quit: 11/24/2005  . Years since quitting: 11.7  Smokeless Tobacco Never Used  Tobacco Comment   Quit 10 years ago, but has 24 years smoking prior to that    Goals Met:  Independence with exercise equipment Exercise tolerated well No report of cardiac concerns or symptoms Strength training completed today  Goals Unmet:  Not Applicable  Comments: Check out 1645.   Dr. Kate Sable is Medical Director for [facility] Cardiac and Pulmonary Rehab.

## 2017-08-11 ENCOUNTER — Encounter (HOSPITAL_COMMUNITY): Payer: Medicare Other

## 2017-08-14 ENCOUNTER — Encounter (HOSPITAL_COMMUNITY)
Admission: RE | Admit: 2017-08-14 | Discharge: 2017-08-14 | Disposition: A | Discharge status: Home / Self Care | Payer: Medicare Other | Source: Ambulatory Visit | Attending: Cardiovascular Disease | Admitting: Cardiovascular Disease

## 2017-08-14 ENCOUNTER — Telehealth: Payer: Self-pay | Admitting: *Deleted

## 2017-08-14 DIAGNOSIS — I5032 Chronic diastolic (congestive) heart failure: Secondary | ICD-10-CM | POA: Diagnosis not present

## 2017-08-14 DIAGNOSIS — I214 Non-ST elevation (NSTEMI) myocardial infarction: Secondary | ICD-10-CM

## 2017-08-14 MED ORDER — ISOSORB DINITRATE-HYDRALAZINE 20-37.5 MG PO TABS: 0.5000 | ORAL_TABLET | Freq: Three times a day (TID) | ORAL | 6 refills | Status: DC

## 2017-08-14 NOTE — Telephone Encounter (Signed)
Reviewed results and recommendations with patient. Instructed her to decreased lasix down to 20 mg daily and to start taking bidil 1/2 tablet three times daily. We are currently out of samples and sent in script to [facility]. Also let her know that Dr. Rockey Situ wants her to have repeat echocardiogram and to avoid foods high in potassium. She verbalized understanding of our conversation, agreement with plan, and had no further questions at this time. Advised that I would have someone from scheduling to call and schedule echocardiogram.

## 2017-08-14 NOTE — Telephone Encounter (Signed)
-----   Message from Minna Merritts, MD sent at 08/13/2017 11:23 AM EST ----- Would suggest she decrease dose of lasix down to 20 mg daily as she was taking prior to recent hospital admission 07/21/2017 Can we give her samples of bidil , take 1/2 pill TID,  Recheck echo in a few weeks Avoid foods high in potassium

## 2017-08-14 NOTE — Progress Notes (Signed)
Daily Session Note  Patient Details  Name: Teresa Mercado MRN: [id number] Date of Birth: 1946/04/01 Referring Provider:     [facility] from 08/04/2017 in [place]  Referring Provider  Dr. Rockey Situ      Encounter Date: 08/14/2017  Check In: Session Check In - 08/14/17 1545      Check-In   Location  [facility]    Staff Present  Diane Angelina Pih, MS, EP, [facility], Exercise Physiologist;Avarae Zwart Wynetta Emery, RN, BSN    Supervising physician immediately available to respond to emergencies  See telemetry face sheet for immediately available MD    Medication changes reported      No    Fall or balance concerns reported     No    Warm-up and Cool-down  Performed as group-led instruction    Resistance Training Performed  Yes    VAD Patient?  No      Pain Assessment   Currently in Pain?  No/denies    Pain Score  0-No pain    Multiple Pain Sites  No       Capillary Blood Glucose: No results found for this or any previous visit (from the past 24 hour(s)).  Exercise Prescription Changes - 08/14/17 0900      Response to Exercise   Blood Pressure (Admit)  120/50    Blood Pressure (Exercise)  162/56    Blood Pressure (Exit)  116/50    Heart Rate (Admit)  80 bpm    Heart Rate (Exercise)  114 bpm    Heart Rate (Exit)  82 bpm    Rating of Perceived Exertion (Exercise)  9    Duration  Progress to 30 minutes of  aerobic without signs/symptoms of physical distress    Intensity  THRR New 107-121-134      Progression   Progression  Continue to progress workloads to maintain intensity without signs/symptoms of physical distress.      Resistance Training   Training Prescription  Yes    Weight  1 2    Reps  10-15      Treadmill   MPH  1    Grade  0    Minutes  15    METs  1.7      NuStep   Level  2    SPM  60    Minutes  20    METs  1.6      Home Exercise Plan   Plans to continue exercise at  Home (comment)    Frequency  Add 2  additional days to program exercise sessions.    Initial Home Exercises Provided  08/09/17       Social History   Tobacco Use  Smoking Status Former Smoker  . Packs/day: 2.00  . Types: Cigarettes  . Last attempt to quit: 11/24/2005  . Years since quitting: 11.7  Smokeless Tobacco Never Used  Tobacco Comment   Quit 10 years ago, but has 66 years smoking prior to that    Goals Met:  Independence with exercise equipment Exercise tolerated well No report of cardiac concerns or symptoms Strength training completed today  Goals Unmet:  Not Applicable  Comments: Check out 1645.   Dr. Kate Sable is Medical Director for [facility] Cardiac and Pulmonary Rehab.

## 2017-08-15 NOTE — Telephone Encounter (Signed)
Scheduled for 08/24/17   Nothing further needed.

## 2017-08-16 ENCOUNTER — Encounter (HOSPITAL_COMMUNITY)
Admission: RE | Admit: 2017-08-16 | Discharge: 2017-08-16 | Disposition: A | Discharge status: Home / Self Care | Payer: Medicare Other | Source: Ambulatory Visit | Attending: Cardiovascular Disease | Admitting: Cardiovascular Disease

## 2017-08-16 DIAGNOSIS — I214 Non-ST elevation (NSTEMI) myocardial infarction: Secondary | ICD-10-CM

## 2017-08-16 DIAGNOSIS — I5032 Chronic diastolic (congestive) heart failure: Secondary | ICD-10-CM

## 2017-08-16 NOTE — Progress Notes (Signed)
Cardiac Individual Treatment Plan  Patient Details  Name: Teresa Mercado MRN: [id number] Date of Birth: 05-Jun-1946 Referring Provider:     CARDIAC REHAB PHASE II ORIENTATION from 08/04/2017 in [place]  Referring Provider  Dr. Rockey Situ      Initial Encounter Date:    CARDIAC REHAB PHASE II ORIENTATION from 08/04/2017 in [place]  Date  08/04/17  Referring Provider  Dr. Rockey Situ      Visit Diagnosis: NSTEMI (non-ST elevated myocardial infarction) [facility])  CHF (congestive heart failure), NYHA class I, chronic, diastolic ([place])  Patient's Home Medications on Admission:  Current Outpatient Medications:  .  albuterol (PROVENTIL HFA;VENTOLIN HFA) 108 (90 Base) MCG/ACT inhaler, Inhale 1-2 puffs into the lungs every 6 (six) hours as needed for wheezing or shortness of breath., Disp: , Rfl:  .  aspirin 81 MG EC tablet, Take 81 mg by mouth daily. Swallow whole., Disp: , Rfl:  .  atorvastatin (LIPITOR) 80 MG tablet, Take 80 mg by mouth daily., Disp: , Rfl:  .  carvedilol (COREG) 3.125 MG tablet, Take 1 tablet (3.125 mg total) 2 (two) times daily with a meal by mouth., Disp: 30 tablet, Rfl: 2 .  cloNIDine (CATAPRES) 0.1 MG tablet, Take 0.1 mg by mouth 2 (two) times daily., Disp: , Rfl:  .  esomeprazole (NEXIUM) 40 MG capsule, Take 1 capsule (40 mg total) by mouth daily., Disp: 90 capsule, Rfl: 3 .  furosemide (LASIX) 40 MG tablet, Take 1 tablet (40 mg total) daily by mouth., Disp: 30 tablet, Rfl: 2 .  glimepiride (AMARYL) 4 MG tablet, Take 4 mg by mouth daily with breakfast., Disp: , Rfl:  .  insulin aspart (NOVOLOG) 100 UNIT/ML FlexPen, Inject 16 Units into the skin daily with lunch. , Disp: , Rfl:  .  insulin detemir (LEVEMIR) 100 UNIT/ML injection, Inject 40 Units into the skin at bedtime. , Disp: , Rfl:  .  isosorbide-hydrALAZINE (BIDIL) 20-37.5 MG tablet, Take 0.5 tablets by mouth 3 (three) times daily., Disp: 45 tablet, Rfl: 6 .  metFORMIN  (GLUCOPHAGE) 1000 MG tablet, Take 1,000 mg by mouth 2 (two) times daily with a meal., Disp: , Rfl:  .  pioglitazone (ACTOS) 45 MG tablet, Take 45 mg by mouth daily., Disp: , Rfl:  .  vitamin B-12 (CYANOCOBALAMIN) 1000 MCG tablet, Take 1,000 mcg by mouth daily., Disp: , Rfl:   Past Medical History: Past Medical History:  Diagnosis Date  . Chronic combined systolic (congestive) and diastolic (congestive) heart failure ([place])    a. 09/2015 Echo: normal EF 55%; b. 07/2017 Echo: EF 25%, Gr2 DD, mild lvh, mid ant/inf/infsept/apical AK. Triv MR. mildly to mod dil LA.  . CKD (chronic kidney disease), stage III ([place])   . COPD (chronic obstructive pulmonary disease) ([facility])    Not on home o2  . Diabetes mellitus without complication ([place])    On Levemir  . Non-ischemic cardiomyopathy ([place])    a. 07/2017 Echo: EF 25%, Gr2 DD - ? takotsubo.  . Non-obstructive CAD (coronary artery disease)    a. 07/2017 Cath: LM nl, LAD 40ost, D1 50, D2 40, LCX 62m, OM1/2 nl, RCA large, nl.  . Syncope    a.  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    Tobacco Use: Social History   Tobacco Use  Smoking Status Former Smoker  . Packs/day: 2.00  . Types: Cigarettes  . Last attempt to quit: 11/24/2005  . Years since quitting: 11.7  Smokeless  Tobacco Never Used  Tobacco Comment   Quit 10 years ago, but has 60 years smoking prior to that    Labs: Recent Review Flowsheet Data    Labs for ITP Cardiac and Pulmonary Rehab Latest Ref Rng & Units 11/26/2015 07/04/2017 07/20/2017   Hemoglobin A1c 4.8 - 5.6 % 6.6(H) 7.9(H) 7.5(H)      Capillary Blood Glucose: Lab Results  Component Value Date   GLUCAP 169 (H) 07/23/2017   GLUCAP 269 (H) 07/22/2017   GLUCAP 225 (H) 07/22/2017   GLUCAP 234 (H) 07/22/2017   GLUCAP 166 (H) 07/22/2017     Exercise Target Goals:    Exercise Program Goal: Individual exercise prescription set with THRR, safety & activity barriers. Participant demonstrates ability  to understand and report RPE using BORG scale, to self-measure pulse accurately, and to acknowledge the importance of the exercise prescription.  Exercise Prescription Goal: Starting with aerobic activity 30 plus minutes a day, 3 days per week for initial exercise prescription. Provide home exercise prescription and guidelines that participant acknowledges understanding prior to discharge.  Activity Barriers & Risk Stratification: Activity Barriers & Cardiac Risk Stratification - 08/04/17 1409      Activity Barriers & Cardiac Risk Stratification   Activity Barriers  Deconditioning;Muscular Weakness;Shortness of Breath    Cardiac Risk Stratification  High       6 Minute Walk: 6 Minute Walk    Row Name 08/04/17 1408         6 Minute Walk   Phase  Initial     Distance  800 feet     Distance % Change  0 %     Distance Feet Change  0 ft     Walk Time  6 minutes     # of Rest Breaks  0     MPH  1.51     METS  2.16     RPE  15     Perceived Dyspnea   13     VO2 Peak  6.59     Symptoms  No     Resting HR  78 bpm     Resting BP  128/54     Resting Oxygen Saturation   97 %     Exercise Oxygen Saturation  during 6 min walk  84 %     Max Ex. HR  120 bpm     Max Ex. BP  166/84     2 Minute Post BP  140/80        Oxygen Initial Assessment: Oxygen Initial Assessment - 08/04/17 1505      Home Oxygen   Home Oxygen Device  Home Concentrator    Sleep Oxygen Prescription  Continuous    Liters per minute  2    Home Exercise Oxygen Prescription  None    Home at Rest Exercise Oxygen Prescription  None    Compliance with Home Oxygen Use  Yes      Initial 6 min Walk   Oxygen Used  None      Program Oxygen Prescription   Program Oxygen Prescription  None       Oxygen Re-Evaluation:   Oxygen Discharge (Final Oxygen Re-Evaluation):   Initial Exercise Prescription: Initial Exercise Prescription - 08/04/17 1400      Date of Initial Exercise RX and Referring Provider   Date   08/04/17    Referring Provider  Dr. Rockey Situ      Treadmill   MPH  1    Grade  0    Minutes  15    METs  1.7      NuStep   Level  2    SPM  57    Minutes  20    METs  1.6      Prescription Details   Frequency (times per week)  3    Duration  Progress to 30 minutes of continuous aerobic without signs/symptoms of physical distress      Intensity   THRR 40-80% of Max Heartrate  106-120-134    Ratings of Perceived Exertion  11-13    Perceived Dyspnea  0-4      Progression   Progression  Continue progressive overload as per policy without signs/symptoms or physical distress.      Resistance Training   Training Prescription  Yes    Weight  1    Reps  10-15       Perform Capillary Blood Glucose checks as needed.  Exercise Prescription Changes:  Exercise Prescription Changes    Row Name 08/14/17 0900             Response to Exercise   Blood Pressure (Admit)  120/50       Blood Pressure (Exercise)  162/56       Blood Pressure (Exit)  116/50       Heart Rate (Admit)  80 bpm       Heart Rate (Exercise)  114 bpm       Heart Rate (Exit)  82 bpm       Rating of Perceived Exertion (Exercise)  9       Duration  Progress to 30 minutes of  aerobic without signs/symptoms of physical distress       Intensity  THRR New 107-121-134         Progression   Progression  Continue to progress workloads to maintain intensity without signs/symptoms of physical distress.         Resistance Training   Training Prescription  Yes       Weight  1 2       Reps  10-15         Treadmill   MPH  1       Grade  0       Minutes  15       METs  1.7         NuStep   Level  2       SPM  60       Minutes  20       METs  1.6         Home Exercise Plan   Plans to continue exercise at  Home (comment)       Frequency  Add 2 additional days to program exercise sessions.       Initial Home Exercises Provided  08/09/17          Exercise Comments:   Exercise Goals and  Review:  Exercise Goals    Row Name 08/04/17 1410             Exercise Goals   Increase Physical Activity  Yes       Intervention  Provide advice, education, support and counseling about physical activity/exercise needs.;Develop an individualized exercise prescription for aerobic and resistive training based on initial evaluation findings, risk stratification, comorbidities and participant's personal goals.       Expected Outcomes  Achievement of increased cardiorespiratory fitness and enhanced flexibility, muscular  endurance and strength shown through measurements of functional capacity and personal statement of participant.       Increase Strength and Stamina  Yes       Intervention  Provide advice, education, support and counseling about physical activity/exercise needs.;Develop an individualized exercise prescription for aerobic and resistive training based on initial evaluation findings, risk stratification, comorbidities and participant's personal goals.       Expected Outcomes  Achievement of increased cardiorespiratory fitness and enhanced flexibility, muscular endurance and strength shown through measurements of functional capacity and personal statement of participant.       Able to understand and use rate of perceived exertion (RPE) scale  Yes       Intervention  Provide education and explanation on how to use RPE scale       Expected Outcomes  Long Term:  Able to use RPE to guide intensity level when exercising independently;Short Term: Able to use RPE daily in rehab to express subjective intensity level       Able to understand and use Dyspnea scale  Yes       Intervention  Provide education and explanation on how to use Dyspnea scale       Expected Outcomes  Short Term: Able to use Dyspnea scale daily in rehab to express subjective sense of shortness of breath during exertion;Long Term: Able to use Dyspnea scale to guide intensity level when exercising independently        Knowledge and understanding of Target Heart Rate Range (THRR)  Yes       Intervention  Provide education and explanation of THRR including how the numbers were predicted and where they are located for reference       Expected Outcomes  Short Term: Able to state/look up THRR       Able to check pulse independently  Yes       Intervention  Provide education and demonstration on how to check pulse in carotid and radial arteries.;Review the importance of being able to check your own pulse for safety during independent exercise       Expected Outcomes  Short Term: Able to explain why pulse checking is important during independent exercise;Long Term: Able to check pulse independently and accurately       Understanding of Exercise Prescription  Yes       Intervention  Provide education, explanation, and written materials on patient's individual exercise prescription       Expected Outcomes  Short Term: Able to explain program exercise prescription;Long Term: Able to explain home exercise prescription to exercise independently          Exercise Goals Re-Evaluation : Exercise Goals Re-Evaluation    Row Name 08/14/17 0953             Exercise Goal Re-Evaluation   Exercise Goals Review  Increase Physical Activity;Increase Strength and Stamina;Knowledge and understanding of Target Heart Rate Range (THRR)       Comments  Patient is doing well in CR. She has just started CR and will be progressed in time.        Expected Outcomes  Patient wishes to improve her health and to live longer.            Discharge Exercise Prescription (Final Exercise Prescription Changes): Exercise Prescription Changes - 08/14/17 0900      Response to Exercise   Blood Pressure (Admit)  120/50    Blood Pressure (Exercise)  162/56    Blood Pressure (Exit)  116/50    Heart Rate (Admit)  80 bpm    Heart Rate (Exercise)  114 bpm    Heart Rate (Exit)  82 bpm    Rating of Perceived Exertion (Exercise)  9    Duration   Progress to 30 minutes of  aerobic without signs/symptoms of physical distress    Intensity  THRR New 107-121-134      Progression   Progression  Continue to progress workloads to maintain intensity without signs/symptoms of physical distress.      Resistance Training   Training Prescription  Yes    Weight  1 2    Reps  10-15      Treadmill   MPH  1    Grade  0    Minutes  15    METs  1.7      NuStep   Level  2    SPM  60    Minutes  20    METs  1.6      Home Exercise Plan   Plans to continue exercise at  Home (comment)    Frequency  Add 2 additional days to program exercise sessions.    Initial Home Exercises Provided  08/09/17       Nutrition:  Target Goals: Understanding of nutrition guidelines, daily intake of sodium 1500mg , cholesterol 200mg , calories 30% from fat and 7% or less from saturated fats, daily to have 5 or more servings of fruits and vegetables.  Biometrics: Pre Biometrics - 08/04/17 1410      Pre Biometrics   Height  5\' 3"  (1.6 m)    Weight  207 lb (93.9 kg)    Waist Circumference  47.5 inches    Hip Circumference  46.5 inches    Waist to Hip Ratio  1.02 %    BMI (Calculated)  36.68    Triceps Skinfold  20 mm    % Body Fat  47 %    Grip Strength  55.93 kg    Flexibility  13.16 in    Single Leg Stand  1 seconds        Nutrition Therapy Plan and Nutrition Goals: Nutrition Therapy & Goals - 08/04/17 1509      Personal Nutrition Goals   Personal Goal #2  Low sodium, low potassium    Additional Goals?  No       Nutrition Discharge: Rate Your Plate Scores: Nutrition Assessments - 08/04/17 1511      MEDFICTS Scores   Pre Score  26       Nutrition Goals Re-Evaluation:   Nutrition Goals Discharge (Final Nutrition Goals Re-Evaluation):   Psychosocial: Target Goals: Acknowledge presence or absence of significant depression and/or stress, maximize coping skills, provide positive support system. Participant is able to verbalize types  and ability to use techniques and skills needed for reducing stress and depression.  Initial Review & Psychosocial Screening: Initial Psych Review & Screening - 08/04/17 1513      Initial Review   Current issues with  None Identified      Family Dynamics   Good Support System?  Yes      Barriers   Psychosocial barriers to participate in program  There are no identifiable barriers or psychosocial needs.      Screening Interventions   Interventions  Encouraged to exercise       Quality of Life Scores: Quality of Life - 08/04/17 1412      Quality of Life Scores   Health/Function  Pre  23.08 %    Socioeconomic Pre  25.71 %    Psych/Spiritual Pre  27.43 %    Family Pre  30 %    GLOBAL Pre  25.69 %       PHQ-9: Recent Review Flowsheet Data    Depression screen [facility] 2/9 08/04/2017   Decreased Interest 0   Down, Depressed, Hopeless 0   PHQ - 2 Score 0   Altered sleeping 1   Tired, decreased energy 1   Change in appetite 0   Feeling bad or failure about yourself  0   Trouble concentrating 0   Moving slowly or fidgety/restless 0   Suicidal thoughts 0   PHQ-9 Score 2   Difficult doing work/chores Somewhat difficult     Interpretation of Total Score  Total Score Depression Severity:  1-4 = Minimal depression, 5-9 = Mild depression, 10-14 = Moderate depression, 15-19 = Moderately severe depression, 20-27 = Severe depression   Psychosocial Evaluation and Intervention: Psychosocial Evaluation - 08/04/17 1514      Psychosocial Evaluation & Interventions   Interventions  Encouraged to exercise with the program and follow exercise prescription    Continue Psychosocial Services   No Follow up required       Psychosocial Re-Evaluation: Psychosocial Re-Evaluation    [place] Name 08/04/17 1514             Psychosocial Re-Evaluation   Current issues with  None Identified       Continue Psychosocial Services   No Follow up required          Psychosocial Discharge (Final  Psychosocial Re-Evaluation): Psychosocial Re-Evaluation - 08/04/17 1514      Psychosocial Re-Evaluation   Current issues with  None Identified    Continue Psychosocial Services   No Follow up required       Vocational Rehabilitation: Provide vocational rehab assistance to qualifying candidates.   Vocational Rehab Evaluation & Intervention: Vocational Rehab - 08/04/17 1502      Initial Vocational Rehab Evaluation & Intervention   Assessment shows need for Vocational Rehabilitation  No       Education: Education Goals: Education classes will be provided on a weekly basis, covering required topics. Participant will state understanding/return demonstration of topics presented.  Learning Barriers/Preferences: Learning Barriers/Preferences - 08/04/17 1502      Learning Barriers/Preferences   Learning Barriers  None    Learning Preferences  Video;Written Material;Pictoral       Education Topics: Hypertension, Hypertension Reduction -Define heart disease and high blood pressure. Discus how high blood pressure affects the body and ways to reduce high blood pressure.   Exercise and Your Heart -Discuss why it is important to exercise, the FITT principles of exercise, normal and abnormal responses to exercise, and how to exercise safely.   Angina -Discuss definition of angina, causes of angina, treatment of angina, and how to decrease risk of having angina.   CARDIAC REHAB PHASE II EXERCISE from 08/09/2017 in [place]  Date  08/09/17  Educator  D. Coad  Instruction Review Code  2- Demonstrated Understanding      Cardiac Medications -Review what the following cardiac medications are used for, how they affect the body, and side effects that may occur when taking the medications.  Medications include Aspirin, Beta blockers, calcium channel blockers, ACE Inhibitors, angiotensin receptor blockers, diuretics, digoxin, and antihyperlipidemics.   Congestive  Heart Failure -Discuss the definition of CHF, how to live with CHF, the signs and symptoms  of CHF, and how keep track of weight and sodium intake.   Heart Disease and Intimacy -Discus the effect sexual activity has on the heart, how changes occur during intimacy as we age, and safety during sexual activity.   Smoking Cessation / COPD -Discuss different methods to quit smoking, the health benefits of quitting smoking, and the definition of COPD.   Nutrition I: Fats -Discuss the types of cholesterol, what cholesterol does to the heart, and how cholesterol levels can be controlled.   Nutrition II: Labels -Discuss the different components of food labels and how to read food label   Heart Parts and Heart Disease -Discuss the anatomy of the heart, the pathway of blood circulation through the heart, and these are affected by heart disease.   Stress I: Signs and Symptoms -Discuss the causes of stress, how stress may lead to anxiety and depression, and ways to limit stress.   Stress II: Relaxation -Discuss different types of relaxation techniques to limit stress.   Warning Signs of Stroke / TIA -Discuss definition of a stroke, what the signs and symptoms are of a stroke, and how to identify when someone is having stroke.   Knowledge Questionnaire Score: Knowledge Questionnaire Score - 08/04/17 1502      Knowledge Questionnaire Score   Pre Score  21/24       Core Components/Risk Factors/Patient Goals at Admission: Personal Goals and Risk Factors at Admission - 08/04/17 1511      Core Components/Risk Factors/Patient Goals on Admission    Weight Management  Weight Maintenance    Improve shortness of breath with ADL's  Yes    Intervention  Provide education, individualized exercise plan and daily activity instruction to help decrease symptoms of SOB with activities of daily living.    Expected Outcomes  Short Term: Achieves a reduction of symptoms when performing activities of  daily living.    Personal Goal Other  Yes    Personal Goal  Improve my health, live longer    Intervention  Attend CR 3 x week and supplement with home exercise 2 x week    Expected Outcomes  achieve personal goals.       Core Components/Risk Factors/Patient Goals Review:    Core Components/Risk Factors/Patient Goals at Discharge (Final Review):    ITP Comments: ITP Comments    Row Name 08/04/17 1503 08/16/17 1249         ITP Comments  Mrs. Hakala is a pleasant 71 year old woman. She is very interested in being in our program. She has COPD w/emphysema. She only uses O2 at night during sleep at 2L.   Patient new to program. He has completed 4 sessions. Will continue to monitor for progress.          Comments: ITP 30 Day REVIEW Patient new to program. She has completed 4 sessions. Will continue to monitor for progress.

## 2017-08-16 NOTE — Progress Notes (Signed)
Daily Session Note  Patient Details  Name: Teresa Mercado MRN: [id number] Date of Birth: 19-Nov-1945 Referring Provider:     [place] from 08/04/2017 in [place]  Referring Provider  Dr. Rockey Situ      Encounter Date: 08/16/2017  Check In: Session Check In - 08/16/17 1545      Check-In   Location  [facility]    Staff Present  Diane Angelina Pih, MS, EP, [facility], Exercise Physiologist;Auguste Tebbetts Wynetta Emery, RN, BSN    Supervising physician immediately available to respond to emergencies  See telemetry face sheet for immediately available MD    Medication changes reported      No    Fall or balance concerns reported     No    Warm-up and Cool-down  Performed as group-led instruction    Resistance Training Performed  Yes    VAD Patient?  No      Pain Assessment   Currently in Pain?  No/denies    Pain Score  0-No pain    Multiple Pain Sites  No       Capillary Blood Glucose: No results found for this or any previous visit (from the past 24 hour(s)).  Exercise Prescription Changes - 08/16/17 1400      Response to Exercise   Blood Pressure (Admit)  120/50    Blood Pressure (Exercise)  162/56    Blood Pressure (Exit)  116/50    Heart Rate (Admit)  80 bpm    Heart Rate (Exercise)  114 bpm    Heart Rate (Exit)  82 bpm    Rating of Perceived Exertion (Exercise)  9    Duration  Progress to 30 minutes of  aerobic without signs/symptoms of physical distress    Intensity  THRR New 107-121-134      Progression   Progression  Continue to progress workloads to maintain intensity without signs/symptoms of physical distress.      Resistance Training   Training Prescription  Yes    Weight  1 2    Reps  10-15      Treadmill   MPH  1    Grade  0    Minutes  15    METs  1.7      NuStep   Level  2    SPM  60    Minutes  20    METs  1.6      Home Exercise Plan   Plans to continue exercise at  Home (comment)    Frequency  Add 2  additional days to program exercise sessions.    Initial Home Exercises Provided  08/09/17       Social History   Tobacco Use  Smoking Status Former Smoker  . Packs/day: 2.00  . Types: Cigarettes  . Last attempt to quit: 11/24/2005  . Years since quitting: 11.7  Smokeless Tobacco Never Used  Tobacco Comment   Quit 10 years ago, but has 2 years smoking prior to that    Goals Met:  Independence with exercise equipment Exercise tolerated well No report of cardiac concerns or symptoms Strength training completed today  Goals Unmet:  Not Applicable  Comments: Check out 1645.   Dr. Kate Sable is Medical Director for [facility] Cardiac and Pulmonary Re[facility].

## 2017-08-16 NOTE — Progress Notes (Signed)
Patient was given take home exercise plan 08/09/2017. THR was addressed as were safety guidelines for being active outside of CR. Patient demonstrated an understanding and was encouraged to ask any future questions as they arise.

## 2017-08-18 ENCOUNTER — Encounter (HOSPITAL_COMMUNITY)
Admission: RE | Admit: 2017-08-18 | Discharge: 2017-08-18 | Disposition: A | Discharge status: Home / Self Care | Payer: Medicare Other | Source: Ambulatory Visit | Attending: Cardiovascular Disease | Admitting: Cardiovascular Disease

## 2017-08-18 DIAGNOSIS — I5032 Chronic diastolic (congestive) heart failure: Secondary | ICD-10-CM

## 2017-08-18 DIAGNOSIS — I214 Non-ST elevation (NSTEMI) myocardial infarction: Secondary | ICD-10-CM

## 2017-08-18 NOTE — Progress Notes (Signed)
Daily Session Note  Patient Details  Name: Teresa Mercado MRN: [id number] Date of Birth: November 17, 1945 Referring Provider:     [place] from 08/04/2017 in [place]  Referring Provider  Dr. Rockey Situ      Encounter Date: 08/18/2017  Check In: Session Check In - 08/18/17 1541      Check-In   Location  [facility]    Staff Present  Aundra Dubin, RN, BSN;Gregory Luther Parody, BS, EP, Exercise Physiologist    Supervising physician immediately available to respond to emergencies  See telemetry face sheet for immediately available MD    Medication changes reported      No    Fall or balance concerns reported     No    Warm-up and Cool-down  Performed as group-led instruction    Resistance Training Performed  Yes    VAD Patient?  No      Pain Assessment   Currently in Pain?  No/denies    Pain Score  0-No pain    Multiple Pain Sites  No       Capillary Blood Glucose: No results found for this or any previous visit (from the past 24 hour(s)).    Social History   Tobacco Use  Smoking Status Former Smoker  . Packs/day: 2.00  . Types: Cigarettes  . Last attempt to quit: 11/24/2005  . Years since quitting: 11.7  Smokeless Tobacco Never Used  Tobacco Comment   Quit 10 years ago, but has 76 years smoking prior to that    Goals Met:  Independence with exercise equipment Exercise tolerated well No report of cardiac concerns or symptoms Strength training completed today  Goals Unmet:  Not Applicable  Comments: Check out 1645.   Dr. Kate Sable is Medical Director for [facility].

## 2017-08-21 ENCOUNTER — Encounter (HOSPITAL_COMMUNITY): Payer: Medicare Other

## 2017-08-22 ENCOUNTER — Ambulatory Visit: Payer: Medicare Other | Admitting: Cardiovascular Disease

## 2017-08-23 ENCOUNTER — Encounter (HOSPITAL_COMMUNITY)
Admission: RE | Admit: 2017-08-23 | Discharge: 2017-08-23 | Disposition: A | Discharge status: Home / Self Care | Payer: Medicare Other | Source: Ambulatory Visit | Attending: Cardiovascular Disease | Admitting: Cardiovascular Disease

## 2017-08-23 ENCOUNTER — Telehealth: Payer: Self-pay | Admitting: Nurse Practitioner

## 2017-08-23 DIAGNOSIS — I214 Non-ST elevation (NSTEMI) myocardial infarction: Secondary | ICD-10-CM | POA: Diagnosis not present

## 2017-08-23 DIAGNOSIS — I5032 Chronic diastolic (congestive) heart failure: Secondary | ICD-10-CM

## 2017-08-23 NOTE — Progress Notes (Signed)
Daily Session Note  Patient Details  Name: Teresa Mercado MRN: [id number] Date of Birth: [birth year] Referring Provider:     [place] from 08/04/2017 in [place]  Referring Provider  Dr. Rockey Situ      Encounter Date: 08/23/2017  Check In: Session Check In - 08/23/17 1545      Check-In   Location  [facility]    Staff Present  Aundra Dubin, RN, BSN;Diane Coad, MS, EP, [facility], Exercise Physiologist    Supervising physician immediately available to respond to emergencies  See telemetry face sheet for immediately available MD    Medication changes reported      No    Fall or balance concerns reported     No    Warm-up and Cool-down  Performed as group-led instruction    Resistance Training Performed  Yes    VAD Patient?  No      Pain Assessment   Currently in Pain?  No/denies    Pain Score  0-No pain    Multiple Pain Sites  No       Capillary Blood Glucose: No results found for this or any previous visit (from the past 24 hour(s)).    Social History   Tobacco Use  Smoking Status Former Smoker  . Packs/day: 2.00  . Types: Cigarettes  . Last attempt to quit: 11/24/2005  . Years since quitting: 11.7  Smokeless Tobacco Never Used  Tobacco Comment   Quit 10 years ago, but has 70 years smoking prior to that    Goals Met:  Independence with exercise equipment Exercise tolerated well No report of cardiac concerns or symptoms Strength training completed today  Goals Unmet:  Not Applicable  Comments: Check out 1645.   Dr. Kate Sable is Medical Director for [facility] Cardiac and Pulmonary Rehab.

## 2017-08-23 NOTE — Telephone Encounter (Signed)
Received request for review of scheduled echocardiogram for tomorrow 08/24/17 by technician and then about follow up appointment. Reviewed chart in detail with Ignacia Bayley NP and patient should reschedule echocardiogram for 3 months from 07/21/17 per Dr. Caryl Comes and then keep follow up scheduled with Ignacia Bayley NP on 09/05/17 for clinical follow up. Will route this message to scheduling and technician of updated plan.

## 2017-08-24 ENCOUNTER — Other Ambulatory Visit: Payer: Medicare Other

## 2017-08-25 ENCOUNTER — Encounter (HOSPITAL_COMMUNITY)
Admission: RE | Admit: 2017-08-25 | Discharge: 2017-08-25 | Disposition: A | Discharge status: Home / Self Care | Payer: Medicare Other | Source: Ambulatory Visit | Attending: Cardiovascular Disease | Admitting: Cardiovascular Disease

## 2017-08-25 DIAGNOSIS — I214 Non-ST elevation (NSTEMI) myocardial infarction: Secondary | ICD-10-CM

## 2017-08-25 DIAGNOSIS — I5032 Chronic diastolic (congestive) heart failure: Secondary | ICD-10-CM

## 2017-08-25 NOTE — Progress Notes (Signed)
Daily Session Note  Patient Details  Name: Teresa Mercado MRN: [id number] Date of Birth: 1945-11-16 Referring Provider:     CARDIAC REHAB PHASE II ORIENTATION from 08/04/2017 in [place]  Referring Provider  Dr. Rockey Situ      Encounter Date: 08/25/2017  Check In: Session Check In - 08/25/17 1545      Check-In   Location  [facility]    Staff Present  Diane Angelina Pih, MS, EP, [facility], Exercise Physiologist;[place] Ev[place] Wynetta Emery, RN, BSN    Supervising physician immediately available to respond to emergencies  See telemetry face sheet for immediately available MD    Medication changes reported      No    Fall or balance concerns reported     No    Warm-up and Cool-down  Performed as group-led instruction    Resistance Training Performed  Yes    VAD Patient?  No      Pain Assessment   Currently in Pain?  No/denies    Pain Score  0-No pain    Multiple Pain Sites  No       Capillary Blood Glucose: No results found for this or any previous visit (from the past 24 hour(s)).    Social History   Tobacco Use  Smoking Status Former Smoker  . Packs/day: 2.00  . Types: Cigarettes  . Last attempt to quit: 11/24/2005  . Years since quitting: 11.7  Smokeless Tobacco Never Used  Tobacco Comment   Quit 10 years ago, but has 24 years smoking prior to that    Goals Met:  Independence with exercise equipment Exercise tolerated well No report of cardiac concerns or symptoms Strength training completed today  Goals Unmet:  Not Applicable  Comments: Check out 1645.   Dr. Kate Sable is Medical Director for [facility] Cardiac and Pulmonary Rehab.

## 2017-08-28 ENCOUNTER — Encounter (HOSPITAL_COMMUNITY): Payer: Medicare Other

## 2017-08-30 ENCOUNTER — Encounter (HOSPITAL_COMMUNITY): Payer: Medicare Other

## 2017-09-01 ENCOUNTER — Encounter (HOSPITAL_COMMUNITY): Payer: Medicare Other

## 2017-09-04 ENCOUNTER — Encounter (HOSPITAL_COMMUNITY): Payer: Medicare Other

## 2017-09-05 ENCOUNTER — Encounter: Payer: Self-pay | Admitting: Nurse Practitioner

## 2017-09-05 ENCOUNTER — Ambulatory Visit (INDEPENDENT_AMBULATORY_CARE_PROVIDER_SITE_OTHER): Payer: Medicare Other | Admitting: Nurse Practitioner

## 2017-09-05 VITALS — BP 140/70 | HR 81 | Ht 63.0 in | Wt 218.5 lb

## 2017-09-05 DIAGNOSIS — N183 Chronic kidney disease, stage 3 unspecified: Secondary | ICD-10-CM

## 2017-09-05 DIAGNOSIS — I129 Hypertensive chronic kidney disease with stage 1 through stage 4 chronic kidney disease, or unspecified chronic kidney disease: Secondary | ICD-10-CM | POA: Diagnosis not present

## 2017-09-05 DIAGNOSIS — I10 Essential (primary) hypertension: Secondary | ICD-10-CM | POA: Diagnosis not present

## 2017-09-05 DIAGNOSIS — I5181 Takotsubo syndrome: Secondary | ICD-10-CM | POA: Diagnosis not present

## 2017-09-05 DIAGNOSIS — I428 Other cardiomyopathies: Secondary | ICD-10-CM | POA: Diagnosis not present

## 2017-09-05 DIAGNOSIS — E782 Mixed hyperlipidemia: Secondary | ICD-10-CM

## 2017-09-05 DIAGNOSIS — I5023 Acute on chronic systolic (congestive) heart failure: Secondary | ICD-10-CM

## 2017-09-05 MED ORDER — CARVEDILOL 3.125 MG PO TABS: 3.1250 mg | ORAL_TABLET | Freq: Two times a day (BID) | ORAL | 3 refills | Status: DC

## 2017-09-05 MED ORDER — CARVEDILOL 3.125 MG PO TABS: 3.1250 mg | ORAL_TABLET | Freq: Two times a day (BID) | ORAL | 6 refills | Status: DC

## 2017-09-05 NOTE — Progress Notes (Signed)
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  Past Medical History    Past Medical History:  Diagnosis Date  . Chronic combined systolic (congestive) and diastolic (congestive) heart failure ([place])    a. 09/2015 Echo: normal EF 55%; b. 07/2017 Echo: EF 25%, Gr2 DD, mild lvh, mid ant/inf/infsept/apical AK. Triv MR. mildly to mod dil LA.  . CKD (chronic kidney disease), stage III ([place])   . COPD (chronic obstructive pulmonary disease) ([facility])    Not on home o2  . Diabetes mellitus without complication ([place])    On Levemir  . Non-ischemic cardiomyopathy ([place])    a. 07/2017 Echo: EF 25%, Gr2 DD - ? takotsubo.  . Non-obstructive CAD (coronary artery disease)    a. 07/2017 Cath: LM nl, LAD 40ost, D1 50, D2 40, LCX 11m, OM1/2 nl, RCA large, nl.  . Syncope    a.  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   Past Surgical History:  Procedure Laterality Date  . APPENDECTOMY    . ESOPHAGOGASTRODUODENOSCOPY N/A 11/27/2015   Procedure: ESOPHAGOGASTRODUODENOSCOPY (EGD);  Surgeon: Lollie Sails, MD;  Location: [place] [facility];  Service: Endoscopy;  Laterality: N/A;  . LEFT HEART CATH AND CORONARY ANGIOGRAPHY N/A 07/21/2017   Procedure: LEFT HEART CATH AND CORONARY ANGIOGRAPHY;  Surgeon: [name], MD;  Location: [place] CV LAB;  Service: Cardiovascular;  Laterality: N/A;  . PACEMAKER INSERTION    . TONSILLECTOMY    . TUBAL LIGATION      Allergies  Allergies  Allergen Reactions  . Quinapril Other (See Comments)    Reaction:  Elevated potassium   .  Venlafaxine Other (See Comments)    Reaction:  GI upset   . Zetia [Ezetimibe] Nausea And Vomiting    History of Present Illness    71 year old female with the above complex past medical history including diastolic heart failure, COPD, diabetes, stage III chronic kidney disease, history of GI bleed, and complete heart block.  In early November, she was admitted to [facility] with chest pain and troponin elevation.  She was transferred to [facility] where echocardiogram showed an EF of 25% with multiple wall motion abnormalities.  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  After aggressive diuresis and 14 pound weight loss, she was discharged home.  I last saw her in clinic on November 6, at which time her weight was 206 pounds.  She was stable at that visit and basic metabolic panel that day showed stable renal dysfunction with a creatinine of 1.8.  Potassium was 5.3 and we elected to continue to hold lisinopril, which was discontinued during her hospitalization.  She had follow-up labs on November 20 to assess stability and creatinine was slightly higher at 1.91.  Potassium was stable at 5.3.  She was advised to reduce her Lasix to 20 mg daily.  BiDil was also added to her regimen.  Since November 20, she has noted progressive lower extremity edema and fatigue.  She is not particularly active and has not noted any significant dyspnea on exertion, PND, orthopnea, dizziness, syncope, chest pain,  or early satiety.  Her weight is up 11 pounds since her visit with Dr. Caryl Comes in November.  She said she was reluctant to call because she was not sure which doctor to call.  Home Medications    Prior to Admission medications   Medication Sig Start Date End Date Taking? Authorizing Provider  albuterol (PROVENTIL HFA;VENTOLIN HFA) 108 (90 Base) MCG/ACT inhaler Inhale 1-2 puffs into the lungs every 6 (six) hours as needed for wheezing or  shortness of breath.   Yes [provider]  aspirin 81 MG EC tablet Take 81 mg by mouth daily. Swallow whole.   Yes [provider]  atorvastatin (LIPITOR) 80 MG tablet Take 80 mg by mouth daily. 07/19/17  Yes [provider]  carvedilol (COREG) 3.125 MG tablet Take 1 tablet (3.125 mg total) 2 (two) times daily with a meal by mouth. 07/23/17  Yes Daune Perch, NP  cloNIDine (CATAPRES) 0.1 MG tablet Take 0.1 mg by mouth 2 (two) times daily.   Yes [provider]  esomeprazole (NEXIUM) 40 MG capsule Take 1 capsule (40 mg total) by mouth daily. 05/25/17  Yes Gollan, Kathlene November, MD  furosemide (LASIX) 40 MG tablet Take 1 tablet (40 mg total) daily by mouth. 07/23/17  Yes Daune Perch, NP  glimepiride (AMARYL) 4 MG tablet Take 4 mg by mouth daily with breakfast.   Yes [provider]  insulin aspart (NOVOLOG) 100 UNIT/ML FlexPen Inject 16 Units into the skin daily with lunch.    Yes [provider]  insulin detemir (LEVEMIR) 100 UNIT/ML injection Inject 40 Units into the skin at bedtime.    Yes [provider]  isosorbide-hydrALAZINE (BIDIL) 20-37.5 MG tablet Take 0.5 tablets by mouth 3 (three) times daily. 08/14/17  Yes Minna Merritts, MD  metFORMIN (GLUCOPHAGE) 1000 MG tablet Take 1,000 mg by mouth 2 (two) times daily with a meal. 07/19/17 07/19/18 Yes [provider]  pioglitazone (ACTOS) 45 MG tablet Take 45 mg by mouth daily.   Yes [provider]  vitamin B-12 (CYANOCOBALAMIN) 1000 MCG tablet Take 1,000 mcg by mouth daily.   Yes [provider]    Review of Systems    Fatigue, lower extremity swelling, and 11 pound weight gain.  No significant dyspnea, chest pain, palpitations, PND, orthopnea, dizziness, syncope, or early satiety.  She has been waking up some mornings with low blood sugars in the 40s.  She self adjusted her Levemir dose.  All other systems reviewed and are otherwise negative except as  noted above.  Physical Exam    VS:  BP 140/70 (BP Location: Left Arm, Patient Position: Sitting, Cuff Size: Normal)   Pulse 81   Ht 5\' 3"  (1.6 m)   Wt 218 lb 8 oz (99.1 kg)   SpO2 96%   BMI 38.71 kg/m  , BMI Body mass index is 38.71 kg/m. GEN: Obese, in no acute distress.  HEENT: normal.  Neck: Supple, obese, difficult to gauge JVP.  No carotid bruits, or masses. Cardiac: RRR, no murmurs, rubs, or gallops. No clubbing, cyanosis, edema.  Radials/DP/PT 2+ and equal bilaterally.  Respiratory:  Respirations regular and unlabored, diminished breath sounds at bilateral bases.  Otherwise clear to auscultation. GI: Obese, soft, nontender, nondistended, BS + x 4. MS: no deformity or atrophy. Skin: warm and dry, no rash. Neuro:  Strength and sensation are intact. Psych: Normal affect.  Accessory Clinical Findings    ECG -a sensed, V paced, 81.  Underlying sinus rhythm.  Assessment & Plan    1.  Acute on chronic systolic congestive heart failure/nonischemic-Takotsubo cardiomyopathy: Over the past month, patient has had progressive lower extremity swelling, fatigue, and an 11 pound weight gain, in the setting of Lasix a dose reduction from 40 mg to 20 mg.  She also admits to adding some salt to her food prior to cooking and does go out to eat fried fish and other seafood.  She had noted weight gain but did not call our office because she was not sure who she would have an opportunity to talk to.  I did recommend that she simply call our office number and any of our providers could help her to manage her weight gain and fluid status, in an effort to avoid volume overload and possible hospitalization.  I have asked her to increase her Lasix up to 40 mg 2 tablets for today and tomorrow and then 40 mg daily going forward.  She does have stage III chronic kidney disease with a baseline creatinine about 1.8.  Follow-up basic metabolic panel in 1 week with follow-up at the same time.  Otherwise continue  beta-blocker and BiDil.  No ace/ARB/ARNI/MRA in the setting of CKD 3.  2.  Nonobstructive CAD: Status post recent non-STEMI.  Medical therapy.  No chest pain.  Continue aspirin, statin, and beta-blocker.  3.  Stage III chronic kidney disease: Creatinine generally trending in the 1.8-1.9 range.  She was 1.91 on November 23 setting of Lasix 40 mg daily thus, I suspect she will tolerate that dose just fine.  Plan follow-up a basic metabolic panel in 1 week at follow-up office visit.  4.  Essential hypertension: Blood pressure elevated today in the setting of volume overload.  She does have some home blood pressures with her that show pressures typically in the 120s.  Adjusting Lasix dose as above.  5.  Hyperlipidemia: Continue statin therapy.  6.  Diabetes mellitus: She has been having some hypoglycemia and reduce her p.m. Levemir from 40 units to 30 units.  I advised that she contact her primary care provider for further instruction and appropriate dosing.  7.  Disposition: Adjusting Lasix today.  Follow-up in clinic in 1 week with basic metabolic panel at that time.   Murray Hodgkins, NP 09/05/2017, 9:54 AM

## 2017-09-05 NOTE — Patient Instructions (Signed)
Medication Instructions: - Your physician has recommended you make the following change in your medication: 1) Lasix (furosemide) 40 mg- take 2 tablets (80 mg) today and tomorrow, then  2) Decrease to 40 mg - take 1 tablet by mouth once daily  Labwork: - none ordered  Procedures/Testing: - none ordered  Follow-Up: - Your physician recommends that you schedule a follow-up appointment in: 1 week with Christell Faith, PA- Wednesday 09/13/17 @ 1:30 pm.   Any Additional Special Instructions Will Be Listed Below (If Applicable).     If you need a refill on your cardiac medications before your next appointment, please call your pharmacy.

## 2017-09-06 ENCOUNTER — Encounter (HOSPITAL_COMMUNITY): Payer: Medicare Other

## 2017-09-06 NOTE — Progress Notes (Signed)
Cardiac Individual Treatment Plan  Patient Details  Name: Teresa Mercado MRN: [id number] Date of Birth: 1945-10-08 Referring Provider:     CARDIAC REHAB PHASE II ORIENTATION from 08/04/2017 in [place]  Referring Provider  Dr. Rockey Situ      Initial Encounter Date:    CARDIAC REHAB PHASE II ORIENTATION from 08/04/2017 in [place]  Date  08/04/17  Referring Provider  Dr. Rockey Situ      Visit Diagnosis: NSTEMI (non-ST elevated myocardial infarction) [facility])  CHF (congestive heart failure), NYHA class I, chronic, diastolic ([place])  Patient's Home Medications on Admission:  Current Outpatient Medications:  .  albuterol (PROVENTIL HFA;VENTOLIN HFA) 108 (90 Base) MCG/ACT inhaler, Inhale 1-2 puffs into the lungs every 6 (six) hours as needed for wheezing or shortness of breath., Disp: , Rfl:  .  aspirin 81 MG EC tablet, Take 81 mg by mouth daily. Swallow whole., Disp: , Rfl:  .  atorvastatin (LIPITOR) 80 MG tablet, Take 80 mg by mouth daily., Disp: , Rfl:  .  carvedilol (COREG) 3.125 MG tablet, Take 1 tablet (3.125 mg total) by mouth 2 (two) times daily with a meal., Disp: 60 tablet, Rfl: 6 .  cloNIDine (CATAPRES) 0.1 MG tablet, Take 0.1 mg by mouth 2 (two) times daily., Disp: , Rfl:  .  esomeprazole (NEXIUM) 40 MG capsule, Take 1 capsule (40 mg total) by mouth daily., Disp: 90 capsule, Rfl: 3 .  furosemide (LASIX) 40 MG tablet, Take 1 tablet (40 mg) by mouth once daily , Disp: , Rfl:  .  glimepiride (AMARYL) 4 MG tablet, Take 4 mg by mouth daily with breakfast., Disp: , Rfl:  .  insulin aspart (NOVOLOG) 100 UNIT/ML FlexPen, Inject 16 Units into the skin daily with lunch. , Disp: , Rfl:  .  insulin detemir (LEVEMIR) 100 UNIT/ML injection, Inject 40 Units into the skin at bedtime. , Disp: , Rfl:  .  isosorbide-hydrALAZINE (BIDIL) 20-37.5 MG tablet, Take 0.5 tablets by mouth 3 (three) times daily., Disp: 45 tablet, Rfl: 6 .  metFORMIN (GLUCOPHAGE) 1000  MG tablet, Take 1,000 mg by mouth 2 (two) times daily with a meal., Disp: , Rfl:  .  pioglitazone (ACTOS) 45 MG tablet, Take 45 mg by mouth daily., Disp: , Rfl:  .  vitamin B-12 (CYANOCOBALAMIN) 1000 MCG tablet, Take 1,000 mcg by mouth daily., Disp: , Rfl:   Past Medical History: Past Medical History:  Diagnosis Date  . Chronic combined systolic (congestive) and diastolic (congestive) heart failure ([place])    a. 09/2015 Echo: normal EF 55%; b. 07/2017 Echo: EF 25%, Gr2 DD, mild lvh, mid ant/inf/infsept/apical AK. Triv MR. mildly to mod dil LA.  . CKD (chronic kidney disease), stage III ([place])   . COPD (chronic obstructive pulmonary disease) ([facility])    Not on home o2  . Diabetes mellitus without complication ([place])    On Levemir  . Non-ischemic cardiomyopathy ([place])    a. 07/2017 Echo: EF 25%, Gr2 DD - ? takotsubo.  . Non-obstructive CAD (coronary artery disease)    a. 07/2017 Cath: LM nl, LAD 40ost, D1 50, D2 40, LCX 12m, OM1/2 nl, RCA large, nl.  . Syncope    a.  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    Tobacco Use: Social History   Tobacco Use  Smoking Status Former Smoker  . Packs/day: 2.00  . Types: Cigarettes  . Last attempt to quit: 11/24/2005  . Years since quitting: 11.7  Smokeless  Tobacco Never Used  Tobacco Comment   Quit 10 years ago, but has 62 years smoking prior to that    Labs: Recent Review Flowsheet Data    Labs for ITP Cardiac and Pulmonary Rehab Latest Ref Rng & Units 11/26/2015 07/04/2017 07/20/2017   Hemoglobin A1c 4.8 - 5.6 % 6.6(H) 7.9(H) 7.5(H)      Capillary Blood Glucose: Lab Results  Component Value Date   GLUCAP 169 (H) 07/23/2017   GLUCAP 269 (H) 07/22/2017   GLUCAP 225 (H) 07/22/2017   GLUCAP 234 (H) 07/22/2017   GLUCAP 166 (H) 07/22/2017     Exercise Target Goals:    Exercise Program Goal: Individual exercise prescription set with THRR, safety & activity barriers. Participant demonstrates ability to understand and  report RPE using BORG scale, to self-measure pulse accurately, and to acknowledge the importance of the exercise prescription.  Exercise Prescription Goal: Starting with aerobic activity 30 plus minutes a day, 3 days per week for initial exercise prescription. Provide home exercise prescription and guidelines that participant acknowledges understanding prior to discharge.  Activity Barriers & Risk Stratification: Activity Barriers & Cardiac Risk Stratification - 08/04/17 1409      Activity Barriers & Cardiac Risk Stratification   Activity Barriers  Deconditioning;Muscular Weakness;Shortness of Breath    Cardiac Risk Stratification  High       6 Minute Walk: 6 Minute Walk    Row Name 08/04/17 1408         6 Minute Walk   Phase  Initial     Distance  800 feet     Distance % Change  0 %     Distance Feet Change  0 ft     Walk Time  6 minutes     # of Rest Breaks  0     MPH  1.51     METS  2.16     RPE  15     Perceived Dyspnea   13     VO2 Peak  6.59     Symptoms  No     Resting HR  78 bpm     Resting BP  128/54     Resting Oxygen Saturation   97 %     Exercise Oxygen Saturation  during 6 min walk  84 %     Max Ex. HR  120 bpm     Max Ex. BP  166/84     2 Minute Post BP  140/80        Oxygen Initial Assessment: Oxygen Initial Assessment - 08/04/17 1505      Home Oxygen   Home Oxygen Device  Home Concentrator    Sleep Oxygen Prescription  Continuous    Liters per minute  2    Home Exercise Oxygen Prescription  None    Home at Rest Exercise Oxygen Prescription  None    Compliance with Home Oxygen Use  Yes      Initial 6 min Walk   Oxygen Used  None      Program Oxygen Prescription   Program Oxygen Prescription  None       Oxygen Re-Evaluation:   Oxygen Discharge (Final Oxygen Re-Evaluation):   Initial Exercise Prescription: Initial Exercise Prescription - 08/04/17 1400      Date of Initial Exercise RX and Referring Provider   Date  08/04/17     Referring Provider  Dr. Rockey Situ      Treadmill   MPH  1    Grade  0    Minutes  15    METs  1.7      NuStep   Level  2    SPM  57    Minutes  20    METs  1.6      Prescription Details   Frequency (times per week)  3    Duration  Progress to 30 minutes of continuous aerobic without signs/symptoms of physical distress      Intensity   THRR 40-80% of Max Heartrate  106-120-134    Ratings of Perceived Exertion  11-13    Perceived Dyspnea  0-4      Progression   Progression  Continue progressive overload as per policy without signs/symptoms or physical distress.      Resistance Training   Training Prescription  Yes    Weight  1    Reps  10-15       Perform Capillary Blood Glucose checks as needed.  Exercise Prescription Changes:  Exercise Prescription Changes    Row Name 08/14/17 0900 08/16/17 1400 09/05/17 0700         Response to Exercise   Blood Pressure (Admit)  120/50  120/50  130/54     Blood Pressure (Exercise)  162/56  162/56  160/62     Blood Pressure (Exit)  116/50  116/50  122/56     Heart Rate (Admit)  80 bpm  80 bpm  80 bpm     Heart Rate (Exercise)  114 bpm  114 bpm  113 bpm     Heart Rate (Exit)  82 bpm  82 bpm  82 bpm     Rating of Perceived Exertion (Exercise)  9  9  12      Duration  Progress to 30 minutes of  aerobic without signs/symptoms of physical distress  Progress to 30 minutes of  aerobic without signs/symptoms of physical distress  Progress to 30 minutes of  aerobic without signs/symptoms of physical distress     Intensity  THRR New 107-121-134  THRR New 107-121-134  THRR unchanged 107-121-134       Progression   Progression  Continue to progress workloads to maintain intensity without signs/symptoms of physical distress.  Continue to progress workloads to maintain intensity without signs/symptoms of physical distress.  Continue to progress workloads to maintain intensity without signs/symptoms of physical distress.       Resistance Training    Training Prescription  Yes  Yes  Yes     Weight  1 2  1 2  1      Reps  10-15  10-15  10-15       Treadmill   MPH  1  1  1.3     Grade  0  0  0     Minutes  15  15  15      METs  1.7  1.7  1.9       NuStep   Level  2  2  2      SPM  60  60  85     Minutes  20  20  20      METs  1.6  1.6  1.8       Home Exercise Plan   Plans to continue exercise at  Home (comment)  Home (comment)  Home (comment)     Frequency  Add 2 additional days to program exercise sessions.  Add 2 additional days to program exercise sessions.  Add 2 additional days to program  exercise sessions.     Initial Home Exercises Provided  08/09/17  08/09/17  08/09/17        Exercise Comments:  Exercise Comments    Row Name 08/16/17 1403 09/05/17 0757         Exercise Comments  Patient was given take home exercise plan 08/09/2017. THR was addressed as were safety guidelines for being active outside of CR. Patient demonstrated an understanding and was encouraged to ask any future questions as they arise.  Patient is doing well in CR. She has increased her speed on the treadmill as well as her SPMs on the Nustep while maintaining her level. She feels that this program is helping her improve her health. There has been progression but lack of attendance may hinder more over time.          Exercise Goals and Review:  Exercise Goals    Row Name 08/04/17 1410             Exercise Goals   Increase Physical Activity  Yes       Intervention  Provide advice, education, support and counseling about physical activity/exercise needs.;Develop an individualized exercise prescription for aerobic and resistive training based on initial evaluation findings, risk stratification, comorbidities and participant's personal goals.       Expected Outcomes  Achievement of increased cardiorespiratory fitness and enhanced flexibility, muscular endurance and strength shown through measurements of functional capacity and personal statement  of participant.       Increase Strength and Stamina  Yes       Intervention  Provide advice, education, support and counseling about physical activity/exercise needs.;Develop an individualized exercise prescription for aerobic and resistive training based on initial evaluation findings, risk stratification, comorbidities and participant's personal goals.       Expected Outcomes  Achievement of increased cardiorespiratory fitness and enhanced flexibility, muscular endurance and strength shown through measurements of functional capacity and personal statement of participant.       Able to understand and use rate of perceived exertion (RPE) scale  Yes       Intervention  Provide education and explanation on how to use RPE scale       Expected Outcomes  Long Term:  Able to use RPE to guide intensity level when exercising independently;Short Term: Able to use RPE daily in rehab to express subjective intensity level       Able to understand and use Dyspnea scale  Yes       Intervention  Provide education and explanation on how to use Dyspnea scale       Expected Outcomes  Short Term: Able to use Dyspnea scale daily in rehab to express subjective sense of shortness of breath during exertion;Long Term: Able to use Dyspnea scale to guide intensity level when exercising independently       Knowledge and understanding of Target Heart Rate Range (THRR)  Yes       Intervention  Provide education and explanation of THRR including how the numbers were predicted and where they are located for reference       Expected Outcomes  Short Term: Able to state/look up THRR       Able to check pulse independently  Yes       Intervention  Provide education and demonstration on how to check pulse in carotid and radial arteries.;Review the importance of being able to check your own pulse for safety during independent exercise       Expected Outcomes  Short Term: Able to explain why pulse checking is important during independent  exercise;Long Term: Able to check pulse independently and accurately       Understanding of Exercise Prescription  Yes       Intervention  Provide education, explanation, and written materials on patient's individual exercise prescription       Expected Outcomes  Short Term: Able to explain program exercise prescription;Long Term: Able to explain home exercise prescription to exercise independently          Exercise Goals Re-Evaluation : Exercise Goals Re-Evaluation    Row Name 08/14/17 0953 09/05/17 0755           Exercise Goal Re-Evaluation   Exercise Goals Review  Increase Physical Activity;Increase Strength and Stamina;Knowledge and understanding of Target Heart Rate Range (THRR)  Increase Physical Activity;Increase Strength and Stamina;Knowledge and understanding of Target Heart Rate Range (THRR)      Comments  Patient is doing well in CR. She has just started CR and will be progressed in time.   Patient is doing well in CR. She has increased her speed on the treadmill as well as her SPMs on the Nustep while maintaining her level. She feels that this program is helping her improve her health. There has been progression but lack of attendance may hinder more over time.       Expected Outcomes  Patient wishes to improve her health and to live longer.   [place] wishes to improve her health and to live longer.           Discharge Exercise Prescription (Final Exercise Prescription Changes): Exercise Prescription Changes - 09/05/17 0700      Response to Exercise   Blood Pressure (Admit)  130/54    Blood Pressure (Exercise)  160/62    Blood Pressure (Exit)  122/56    Heart Rate (Admit)  80 bpm    Heart Rate (Exercise)  113 bpm    Heart Rate (Exit)  82 bpm    Rating of Perceived Exertion (Exercise)  12    Duration  Progress to 30 minutes of  aerobic without signs/symptoms of physical distress    Intensity  THRR unchanged 107-121-134      Progression   Progression  Continue to  progress workloads to maintain intensity without signs/symptoms of physical distress.      Resistance Training   Training Prescription  Yes    Weight  1    Reps  10-15      Treadmill   MPH  1.3    Grade  0    Minutes  15    METs  1.9      NuStep   Level  2    SPM  85    Minutes  20    METs  1.8      Home Exercise Plan   Plans to continue exercise at  Home (comment)    Frequency  Add 2 additional days to program exercise sessions.    Initial Home Exercises Provided  08/09/17       Nutrition:  Target Goals: Understanding of nutrition guidelines, daily intake of sodium 1500mg , cholesterol 200mg , calories 30% from fat and 7% or less from saturated fats, daily to have 5 or more servings of fruits and vegetables.  Biometrics: Pre Biometrics - 08/04/17 1410      Pre Biometrics   Height  5\' 3"  (1.6 m)    Weight  207 lb (93.9 kg)    Waist  Circumference  47.5 inches    Hip Circumference  46.5 inches    Waist to Hip Ratio  1.02 %    BMI (Calculated)  36.68    Triceps Skinfold  20 mm    % Body Fat  47 %    Grip Strength  55.93 kg    Flexibility  13.16 in    Single Leg Stand  1 seconds        Nutrition Therapy Plan and Nutrition Goals: Nutrition Therapy & Goals - 09/06/17 1326      Nutrition Therapy   RD appointment defered  Yes      Personal Nutrition Goals   Personal Goal #2  Low Na; Low K    Comments  Patient says she is meeting her personal nutrition goal and also following a diabetic diet.        Nutrition Discharge: Rate Your Plate Scores: Nutrition Assessments - 08/04/17 1511      MEDFICTS Scores   Pre Score  26       Nutrition Goals Re-Evaluation:   Nutrition Goals Discharge (Final Nutrition Goals Re-Evaluation):   Psychosocial: Target Goals: Acknowledge presence or absence of significant depression and/or stress, maximize coping skills, provide positive support system. Participant is able to verbalize types and ability to use techniques and  skills needed for reducing stress and depression.  Initial Review & Psychosocial Screening: Initial Psych Review & Screening - 08/04/17 1513      Initial Review   Current issues with  None Identified      Family Dynamics   Good Support System?  Yes      Barriers   Psychosocial barriers to participate in program  There are no identifiable barriers or psychosocial needs.      Screening Interventions   Interventions  Encouraged to exercise       Quality of Life Scores: Quality of Life - 08/04/17 1412      Quality of Life Scores   Health/Function Pre  23.08 %    Socioeconomic Pre  25.71 %    Psych/Spiritual Pre  27.43 %    Family Pre  30 %    GLOBAL Pre  25.69 %       PHQ-9: Recent Review Flowsheet Data    Depression screen [facility] 2/9 08/04/2017   Decreased Interest 0   Down, Depressed, Hopeless 0   PHQ - 2 Score 0   Altered sleeping 1   Tired, decreased energy 1   Change in appetite 0   Feeling bad or failure about yourself  0   Trouble concentrating 0   Moving slowly or fidgety/restless 0   Suicidal thoughts 0   PHQ-9 Score 2   Difficult doing work/chores Somewhat difficult     Interpretation of Total Score  Total Score Depression Severity:  1-4 = Minimal depression, 5-9 = Mild depression, 10-14 = Moderate depression, 15-19 = Moderately severe depression, 20-27 = Severe depression   Psychosocial Evaluation and Intervention: Psychosocial Evaluation - 08/04/17 1514      Psychosocial Evaluation & Interventions   Interventions  Encouraged to exercise with the program and follow exercise prescription    Continue Psychosocial Services   No Follow up required       Psychosocial Re-Evaluation: Psychosocial Re-Evaluation    [place] Name 08/04/17 1514 09/06/17 1335           Psychosocial Re-Evaluation   Current issues with  None Identified  None Identified      Comments  -  Patient initial QOL score was 25.69 and her PHQ-9 score was 2. No issues identified.        Expected Outcomes  -  Patient will have no psychosocial issues identified at discharge.       Interventions  -  Encouraged to attend Cardiac Rehabilitation for the exercise;Stress management education;Relaxation education      Continue Psychosocial Services   No Follow up required  No Follow up required         Psychosocial Discharge (Final Psychosocial Re-Evaluation): Psychosocial Re-Evaluation - 09/06/17 1335      Psychosocial Re-Evaluation   Current issues with  None Identified    Comments  Patient initial QOL score was 25.69 and her PHQ-9 score was 2. No issues identified.     Expected Outcomes  Patient will have no psychosocial issues identified at discharge.     Interventions  Encouraged to attend Cardiac Rehabilitation for the exercise;Stress management education;Relaxation education    Continue Psychosocial Services   No Follow up required       Vocational Rehabilitation: Provide vocational rehab assistance to qualifying candidates.   Vocational Rehab Evaluation & Intervention: Vocational Rehab - 08/04/17 1502      Initial Vocational Rehab Evaluation & Intervention   Assessment shows need for Vocational Rehabilitation  No       Education: Education Goals: Education classes will be provided on a weekly basis, covering required topics. Participant will state understanding/return demonstration of topics presented.  Learning Barriers/Preferences: Learning Barriers/Preferences - 08/04/17 1502      Learning Barriers/Preferences   Learning Barriers  None    Learning Preferences  Video;Written Material;Pictoral       Education Topics: Hypertension, Hypertension Reduction -Define heart disease and high blood pressure. Discus how high blood pressure affects the body and ways to reduce high blood pressure.   Exercise and Your Heart -Discuss why it is important to exercise, the FITT principles of exercise, normal and abnormal responses to exercise, and how to exercise  safely.   Angina -Discuss definition of angina, causes of angina, treatment of angina, and how to decrease risk of having angina.   CARDIAC REHAB PHASE II EXERCISE from 08/23/2017 in [place]  Date  08/09/17  Educator  D. Coad  Instruction Review Code  2- Demonstrated Understanding      Cardiac Medications -Review what the following cardiac medications are used for, how they affect the body, and side effects that may occur when taking the medications.  Medications include Aspirin, Beta blockers, calcium channel blockers, ACE Inhibitors, angiotensin receptor blockers, diuretics, digoxin, and antihyperlipidemics.   CARDIAC REHAB PHASE II EXERCISE from 08/23/2017 in [place]  Date  08/16/17  Educator  DC  Instruction Review Code  2- Demonstrated Understanding      Congestive Heart Failure -Discuss the definition of CHF, how to live with CHF, the signs and symptoms of CHF, and how keep track of weight and sodium intake.   CARDIAC REHAB PHASE II EXERCISE from 08/23/2017 in [place]  Date  08/23/17  Educator  DC  Instruction Review Code  2- Demonstrated Understanding      Heart Disease and Intimacy -Discus the effect sexual activity has on the heart, how changes occur during intimacy as we age, and safety during sexual activity.   Smoking Cessation / COPD -Discuss different methods to quit smoking, the health benefits of quitting smoking, and the definition of COPD.   Nutrition I: Fats -Discuss the types of  cholesterol, what cholesterol does to the heart, and how cholesterol levels can be controlled.   Nutrition II: Labels -Discuss the different components of food labels and how to read food label   Heart Parts and Heart Disease -Discuss the anatomy of the heart, the pathway of blood circulation through the heart, and these are affected by heart disease.   Stress I: Signs and Symptoms -Discuss the  causes of stress, how stress may lead to anxiety and depression, and ways to limit stress.   Stress II: Relaxation -Discuss different types of relaxation techniques to limit stress.   Warning Signs of Stroke / TIA -Discuss definition of a stroke, what the signs and symptoms are of a stroke, and how to identify when someone is having stroke.   Knowledge Questionnaire Score: Knowledge Questionnaire Score - 08/04/17 1502      Knowledge Questionnaire Score   Pre Score  21/24       Core Components/Risk Factors/Patient Goals at Admission: Personal Goals and Risk Factors at Admission - 08/04/17 1511      Core Components/Risk Factors/Patient Goals on Admission    Weight Management  Weight Maintenance    Improve shortness of breath with ADL's  Yes    Intervention  Provide education, individualized exercise plan and daily activity instruction to help decrease symptoms of SOB with activities of daily living.    Expected Outcomes  Short Term: Achieves a reduction of symptoms when performing activities of daily living.    Personal Goal Other  Yes    Personal Goal  Improve my health, live longer    Intervention  Attend CR 3 x week and supplement with home exercise 2 x week    Expected Outcomes  achieve personal goals.       Core Components/Risk Factors/Patient Goals Review:  Goals and Risk Factor Review    Row Name 09/06/17 1327             Core Components/Risk Factors/Patient Goals Review   Personal Goals Review  Weight Management/Obesity;Increase knowledge of respiratory medications and ability to use respiratory devices properly.;Diabetes Improve health; live longer.       Review  Patient has completed 8 sessions maintaining her weight. She has been out for the last 2 weeks due to the weather and hypoglycemic episodes. She saw her Cardiologist. NP decreased her evening Levemir dosage from 40 to 30 U. She increased her Lasix from 20 mg to 40 mg daily. Patient says her balance has  improved since she started and she is feeling a little stronger. Will continue to monitor.        Expected Outcomes  Patient will continue to attend sessions and complete the program and meet her personal goals.           Core Components/Risk Factors/Patient Goals at Discharge (Final Review):  Goals and Risk Factor Review - 09/06/17 1327      Core Components/Risk Factors/Patient Goals Review   Personal Goals Review  Weight Management/Obesity;Increase knowledge of respiratory medications and ability to use respiratory devices properly.;Diabetes Improve health; live longer.    Review  Patient has completed 8 sessions maintaining her weight. She has been out for the last 2 weeks due to the weather and hypoglycemic episodes. She saw her Cardiologist. NP decreased her evening Levemir dosage from 40 to 30 U. She increased her Lasix from 20 mg to 40 mg daily. Patient says her balance has improved since she started and she is feeling a little stronger. Will continue to  monitor.     Expected Outcomes  Patient will continue to attend sessions and complete the program and meet her personal goals.        ITP Comments: ITP Comments    Row Name 08/04/17 1503 08/16/17 1249         ITP Comments  Mrs. Nolte is a pleasant 71 year old woman. She is very interested in being in our program. She has COPD w/emphysema. She only uses O2 at night during sleep at 2L.   Patient new to program. He has completed 4 sessions. Will continue to monitor for progress.          Comments: ITP 30 Day REVIEW Patient doing well in the program. Will continue to monitor for progress.

## 2017-09-08 ENCOUNTER — Encounter (HOSPITAL_COMMUNITY): Payer: Medicare Other

## 2017-09-11 ENCOUNTER — Encounter (HOSPITAL_COMMUNITY): Payer: Medicare Other

## 2017-09-13 ENCOUNTER — Other Ambulatory Visit: Payer: Self-pay

## 2017-09-13 ENCOUNTER — Other Ambulatory Visit
Admission: RE | Admit: 2017-09-13 | Discharge: 2017-09-13 | Disposition: A | Discharge status: Home / Self Care | Payer: Medicare Other | Source: Ambulatory Visit | Attending: Physician Assistant | Admitting: Physician Assistant

## 2017-09-13 ENCOUNTER — Encounter: Payer: Self-pay | Admitting: Physician Assistant

## 2017-09-13 ENCOUNTER — Ambulatory Visit
Admission: RE | Admit: 2017-09-13 | Discharge: 2017-09-13 | Disposition: A | Discharge status: Home / Self Care | Payer: Medicare Other | Source: Ambulatory Visit | Attending: Physician Assistant | Admitting: Physician Assistant

## 2017-09-13 ENCOUNTER — Ambulatory Visit (INDEPENDENT_AMBULATORY_CARE_PROVIDER_SITE_OTHER): Payer: Medicare Other | Admitting: Physician Assistant

## 2017-09-13 ENCOUNTER — Encounter (HOSPITAL_COMMUNITY): Payer: Medicare Other

## 2017-09-13 VITALS — BP 130/74 | HR 80 | Ht 63.0 in | Wt 213.8 lb

## 2017-09-13 DIAGNOSIS — J069 Acute upper respiratory infection, unspecified: Secondary | ICD-10-CM

## 2017-09-13 DIAGNOSIS — I5023 Acute on chronic systolic (congestive) heart failure: Secondary | ICD-10-CM

## 2017-09-13 DIAGNOSIS — I2510 Atherosclerotic heart disease of native coronary artery without angina pectoris: Secondary | ICD-10-CM

## 2017-09-13 DIAGNOSIS — I10 Essential (primary) hypertension: Secondary | ICD-10-CM | POA: Diagnosis not present

## 2017-09-13 DIAGNOSIS — R0602 Shortness of breath: Secondary | ICD-10-CM | POA: Diagnosis not present

## 2017-09-13 DIAGNOSIS — E782 Mixed hyperlipidemia: Secondary | ICD-10-CM

## 2017-09-13 DIAGNOSIS — I5181 Takotsubo syndrome: Secondary | ICD-10-CM | POA: Diagnosis not present

## 2017-09-13 DIAGNOSIS — E162 Hypoglycemia, unspecified: Secondary | ICD-10-CM

## 2017-09-13 DIAGNOSIS — N183 Chronic kidney disease, stage 3 unspecified: Secondary | ICD-10-CM

## 2017-09-13 DIAGNOSIS — I428 Other cardiomyopathies: Secondary | ICD-10-CM

## 2017-09-13 LAB — BASIC METABOLIC PANEL
Anion gap: 6 (ref 5–15)
BUN: 41 mg/dL — ABNORMAL HIGH (ref 6–20)
CALCIUM: 9.6 mg/dL (ref 8.9–10.3)
CO2: 31 mmol/L (ref 22–32)
CREATININE: 1.94 mg/dL — AB (ref 0.44–1.00)
Chloride: 100 mmol/L — ABNORMAL LOW (ref 101–111)
GFR, EST AFRICAN AMERICAN: 29 mL/min — AB (ref >60)
GFR, EST NON AFRICAN AMERICAN: 25 mL/min — AB (ref >60)
Glucose, Bld: 158 mg/dL — ABNORMAL HIGH (ref 65–99)
Potassium: 5.2 mmol/L — ABNORMAL HIGH (ref 3.5–5.1)
Sodium: 137 mmol/L (ref 135–145)

## 2017-09-13 LAB — CBC WITH DIFFERENTIAL/PLATELET
BASOS ABS: 0 10*3/uL (ref 0–0.1)
BASOS PCT: 1 %
EOS ABS: 0.2 10*3/uL (ref 0–0.7)
EOS PCT: 2 %
HEMATOCRIT: 33.3 % — AB (ref 35.0–47.0)
Hemoglobin: 10.7 g/dL — ABNORMAL LOW (ref 12.0–16.0)
Lymphocytes Relative: 20 %
Lymphs Abs: 1.4 10*3/uL (ref 1.0–3.6)
MCH: 28.5 pg (ref 26.0–34.0)
MCHC: 32.1 g/dL (ref 32.0–36.0)
MCV: 88.9 fL (ref 80.0–100.0)
MONO ABS: 0.9 10*3/uL (ref 0.2–0.9)
MONOS PCT: 12 %
NEUTROS ABS: 4.6 10*3/uL (ref 1.4–6.5)
Neutrophils Relative %: 65 %
PLATELETS: 344 10*3/uL (ref 150–440)
RBC: 3.75 MIL/uL — ABNORMAL LOW (ref 3.80–5.20)
RDW: 16.6 % — AB (ref 11.5–14.5)
WBC: 7.1 10*3/uL (ref 3.6–11.0)

## 2017-09-13 MED ORDER — DOXYCYCLINE HYCLATE 100 MG PO TABS: 100.0000 mg | ORAL_TABLET | Freq: Two times a day (BID) | ORAL | 0 refills | Status: DC

## 2017-09-13 NOTE — Patient Instructions (Signed)
Medication Instructions: - Your physician recommends that you continue on your current medications as directed. Please refer to the Current Medication list given to you today.  Labwork: - Your physician recommends that you have lab work today: BMP/ [place] entrance  Procedures/Testing: - A chest x-ray takes a picture of the organs and structures inside the chest, including the heart, lungs, and blood vessels. This test can show several things, including, whether the heart is enlarges; whether fluid is building up in the lungs; and whether pacemaker / defibrillator leads are still in place- please go to the [facility] entrance- 1st desk on the right to check in for x-ray and labs  Follow-Up: - Your physician recommends that you schedule a follow-up appointment in: 1 month with Dr. Rockey Situ.   Any Additional Special Instructions Will Be Listed Below (If Applicable).     If you need a refill on your cardiac medications before your next appointment, please call your pharmacy.

## 2017-09-13 NOTE — Progress Notes (Signed)
Cardiology Office Note Date:  09/13/2017  Patient ID:  Teresa Mercado, Teresa Mercado [birth year], MRN [id number] PCP:  Idelle Crouch, MD  Cardiologist:  Dr. Rockey Situ, MD    Chief Complaint: Follow up  History of Present Illness: Teresa Mercado is a 71 y.o. 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   In early November, she was admitted to [facility] with chest pain and troponin elevation.  She was transferred to [facility] where echocardiogram showed an EF of 25% with multiple wall motion abnormalities.  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  After aggressive diuresis and 14 pound weight loss, she was discharged home. She was seen in clinic on 11/6, at which time her weight was 206 pounds.  She was stable at that visit and basic metabolic panel that day showed stable renal dysfunction with a creatinine of 1.8.  Potassium was 5.3 and we elected to continue to hold lisinopril, which was discontinued during her hospitalization.  She had follow-up labs on November 20 to assess stability and creatinine was slightly higher at 1.91.  Potassium was stable at 5.3.  She was advised to reduce her Lasix to 20 mg daily.  BiDil was also added to her regimen. She was seen most recently on 12/18 and noted progressive lower extremity edema and fatigue. Her weight was noted to be up 11 pounds from her visit with Dr. Caryl Comes on 11/20. This was in the setting of dietary noncompliance and Lasix reduction as above. Her Lasix was increased to 40 mg bid x 2 days, followed by 40 mg daily thereafter.  She comes in today stating, "I feel like I have pneumonia." For the past week she has noted nasal congestion, rhinorrhea, subjective  fever and chills, and myalgias. She has continued to take Lasix 40 mg daily, though has not yet taken any of her medications today. She is uncertain if she is taking BiDil. She does not check her BP at home. She does weigh daily and weighed either 211 or 212 today and reports a weight of 211 or 212 on 12/25. She denies any orthopnea, early satiety, lower extremity swelling, or abdominal distension. She continues to eat out at restaurants ~ once per week. She denies adding salt to her foods and reports drinking < 2 L of fluids daily. She denies any chest pain, dizziness, presyncope, syncope, or palpitations. Has had some issues with low blood sugars, leading to frequent snacking.     Past Medical History:  Diagnosis Date  . Chronic combined systolic (congestive) and diastolic (congestive) heart failure ([place])    a. 09/2015 Echo: normal EF 55%; b. 07/2017 Echo: EF 25%, Gr2 DD, mild lvh, mid ant/inf/infsept/apical AK. Triv MR. mildly to mod dil LA.  . CKD (chronic kidney disease), stage III ([place])   . COPD (chronic obstructive pulmonary disease) ([facility])    Not on home o2  . Diabetes mellitus without complication ([place])    On Levemir  . Non-ischemic cardiomyopathy ([place])    a. 07/2017 Echo: EF 25%, Gr2 DD - ? takotsubo.  . Non-obstructive CAD (coronary artery disease)    a. 07/2017 Cath: LM nl, LAD 40ost, D1 50, D2 40, LCX 85m, OM1/2 nl, RCA large, nl.  . Syncope    a.  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    Past Surgical History:  Procedure Laterality Date  . APPENDECTOMY    . ESOPHAGOGASTRODUODENOSCOPY N/A 11/27/2015   Procedure: ESOPHAGOGASTRODUODENOSCOPY (EGD);  Surgeon: Lollie Sails, MD;  Location: [facility] ENDOSCOPY;  Service: Endoscopy;  Laterality: N/A;  . LEFT HEART CATH AND CORONARY ANGIOGRAPHY N/A 07/21/2017   Procedure: LEFT HEART CATH AND CORONARY ANGIOGRAPHY;  Surgeon: [name], MD;  Location: [facility];  Service: Cardiovascular;  Laterality:  N/A;  . PACEMAKER INSERTION    . TONSILLECTOMY    . TUBAL LIGATION      Current Meds  Medication Sig  . albuterol (PROVENTIL HFA;VENTOLIN HFA) 108 (90 Base) MCG/ACT inhaler Inhale 1-2 puffs into the lungs every 6 (six) hours as needed for wheezing or shortness of breath.  Marland Kitchen aspirin 81 MG EC tablet Take 81 mg by mouth daily. Swallow whole.  Marland Kitchen atorvastatin (LIPITOR) 80 MG tablet Take 80 mg by mouth daily.  . carvedilol (COREG) 3.125 MG tablet Take 1 tablet (3.125 mg total) by mouth 2 (two) times daily with a meal.  . cloNIDine (CATAPRES) 0.1 MG tablet Take 0.1 mg by mouth 2 (two) times daily.  Marland Kitchen esomeprazole (NEXIUM) 40 MG capsule Take 1 capsule (40 mg total) by mouth daily.  . furosemide (LASIX) 40 MG tablet Take 1 tablet (40 mg) by mouth once daily   . glimepiride (AMARYL) 4 MG tablet Take 4 mg by mouth daily with breakfast.  . insulin aspart (NOVOLOG) 100 UNIT/ML FlexPen Inject 16 Units into the skin daily with lunch.   . insulin detemir (LEVEMIR) 100 UNIT/ML injection Inject 40 Units into the skin at bedtime.   . isosorbide-hydrALAZINE (BIDIL) 20-37.5 MG tablet Take 0.5 tablets by mouth 3 (three) times daily.  . metFORMIN (GLUCOPHAGE) 1000 MG tablet Take 1,000 mg by mouth 2 (two) times daily with a meal.  . pioglitazone (ACTOS) 45 MG tablet Take 45 mg by mouth daily.  . vitamin B-12 (CYANOCOBALAMIN) 1000 MCG tablet Take 1,000 mcg by mouth daily.    Allergies:   Quinapril; Venlafaxine; and Zetia [ezetimibe]   Social History:  The patient  reports that she quit smoking about 11 years ago. Her smoking use included cigarettes. She smoked 2.00 packs per day. she has never used smokeless tobacco. She reports that she does not drink alcohol or use drugs.   Family History:  The patient's family history includes Esophageal cancer in her brother; Kidney cancer in her father.  ROS:   Review of Systems  Constitutional: Positive for malaise/fatigue. Negative for chills, diaphoresis, fever and  weight loss.  HENT: Positive for congestion, sinus pain and sore throat.        Rhinorrhea   Eyes: Negative for discharge and redness.  Respiratory: Positive for cough and shortness of breath. Negative for hemoptysis, sputum production and wheezing.   Cardiovascular: Negative for chest pain, palpitations, orthopnea, claudication, leg swelling and PND.  Gastrointestinal: Negative for abdominal pain, blood in stool, heartburn, melena, nausea and vomiting.  Genitourinary: Negative for hematuria.  Musculoskeletal: Negative for falls and myalgias.  Skin: Negative for rash.  Neurological: Positive for weakness. Negative for dizziness, tingling, tremors, sensory change, speech change, focal weakness and loss of consciousness.  Endo/Heme/Allergies: Does not bruise/bleed easily.  Psychiatric/Behavioral: Negative for substance abuse. The patient is not nervous/anxious.   All other systems reviewed and are negative.    PHYSICAL EXAM:  VS:  BP 130/74 (BP Location: Left Arm, Patient Position:  Sitting, Cuff Size: Large)   Pulse 80   Ht 5\' 3"  (1.6 m)   Wt 213 lb 12 oz (97 kg)   BMI 37.86 kg/m  BMI: Body mass index is 37.86 kg/m.  Physical Exam  Constitutional: She is oriented to person, place, and time. She appears well-developed and well-nourished.  HENT:  Head: Normocephalic and atraumatic.  Eyes: Right eye exhibits no discharge. Left eye exhibits no discharge.  Neck: Normal range of motion. No JVD present.  Cardiovascular: Normal rate, regular rhythm, S1 normal, S2 normal and normal heart sounds. Exam reveals no distant heart sounds, no friction rub, no midsystolic click and no opening snap.  No murmur heard. Pulses:      Posterior tibial pulses are 2+ on the right side, and 2+ on the left side.  Pulmonary/Chest: Effort normal and breath sounds normal. No respiratory distress. She has no decreased breath sounds. She has no wheezes. She has no rales. She exhibits no tenderness.  Abdominal:  Soft. She exhibits no distension. There is no tenderness.  Musculoskeletal: She exhibits no edema.  Neurological: She is alert and oriented to person, place, and time.  Skin: Skin is warm and dry. No cyanosis. Nails show no clubbing.  Psychiatric: She has a normal mood and affect. Her speech is normal and behavior is normal. Judgment and thought content normal.     EKG:  Was declined by the patient today.   Recent Labs: 07/20/2017: B Natriuretic Peptide 122.0 07/21/2017: Hemoglobin 10.9; Platelets 325 08/08/2017: BUN 56; Creatinine, Ser 1.91; Potassium 5.3; Sodium 140  No results found for requested labs within last 8760 hours.   CrCl cannot be calculated (Patient's most recent lab result is older than the maximum 21 days allowed.).   Wt Readings from Last 3 Encounters:  09/13/17 213 lb 12 oz (97 kg)  09/05/17 218 lb 8 oz (99.1 kg)  08/08/17 206 lb 12 oz (93.8 kg)     Other studies reviewed: Additional studies/records reviewed today include: summarized above  ASSESSMENT AND PLAN:  1. Acute on chronic systolic CHF/NICM/Takotsubo cardiomyopathy: Improving, though she does continue to appear volume overloaded. Weight is down 5 pounds today from her visit on 12/18 (218-->213 pounds), which is still up 7 pounds from her visit on 11/20 when she weighed 206 pounds. She reports a stable weight at home, though cannot provide a log for review or details. She now denies adding salt to her foods, though does continue to eat out at restaurants weekly. She reports compliance with her Lasix 40 mg daily. CHF education was provided. Check bmet today and CXR to assist in guiding her diuresis. For now, continue Lasix 40 mg daily, Coreg3.125 mg bid, and BiDil. Further titration of medications pending labs and CXR. Not on an ACEi/ARB/ARNI/spiro given CKD stage III. It appears compliance is a limiting factor for her.  2. Nonobstructive CAD: No symptoms concerning for angina. Recent nonobstructive LHC 07/2017.  Continue current medications. No plans for further ischemic evaluation at this time.   3. CKD stage III: Check bmet today. Baseline SCr 1.8-1.9.   4. HTN: Blood pressure is reasonably well controlled today, given she has not yet taken any of her medications. Continue current therapy.   5. HLD: Lipitor.   6. Hypoglycemia: Advised patient to follow up with PCP/endocrinology. She was advised of this at her lat visit on 12/18, though has yet to contact her PCP/endocrinology.   7. URI: Check CXR and CBC today. Follow up with PCP.   Disposition:  F/u with Dr. Rockey Situ in 1 month.   Current medicines are reviewed at length with the patient today.  The patient did not have any concerns regarding medicines.  Signed, Christell Faith, PA-C 09/13/2017 2:10 PM     [address] [place], [place] [address] [phone]

## 2017-09-15 ENCOUNTER — Encounter (HOSPITAL_COMMUNITY): Payer: Medicare Other

## 2017-09-15 ENCOUNTER — Other Ambulatory Visit: Payer: Self-pay

## 2017-09-18 ENCOUNTER — Encounter (HOSPITAL_COMMUNITY): Payer: Medicare Other

## 2017-09-20 ENCOUNTER — Encounter (HOSPITAL_COMMUNITY): Payer: Medicare Other

## 2017-09-22 ENCOUNTER — Encounter (HOSPITAL_COMMUNITY): Payer: Medicare Other

## 2017-09-22 NOTE — Progress Notes (Signed)
Discharge Progress Report  Patient Details  Name: Teresa Mercado MRN: [id number] Date of Birth: Jul 22, 1946 Referring Provider:     [place] from 08/04/2017 in [place]  Referring Provider  Dr. Rockey Situ       Number of Visits: 8  Reason for Discharge:  Early Exit:  Lack of attendance  Smoking History:  Social History   Tobacco Use  Smoking Status Former Smoker  . Packs/day: 2.00  . Types: Cigarettes  . Last attempt to quit: 11/24/2005  . Years since quitting: 11.8  Smokeless Tobacco Never Used  Tobacco Comment   Quit 10 years ago, but has 61 years smoking prior to that    Diagnosis:  NSTEMI (non-ST elevated myocardial infarction) ([place])  CHF (congestive heart failure), NYHA class I, chronic, diastolic (H[facility])  ADL UCSD:   Initial Exercise Prescription: Initial Exercise Prescription - 08/04/17 1400      Date of Initial Exercise RX and Referring Provider   Date  08/04/17    Referring Provider  Dr. Rockey Situ      Treadmill   MPH  1    Grade  0    Minutes  15    METs  1.7      NuStep   Level  2    SPM  57    Minutes  20    METs  1.6      Prescription Details   Frequency (times per week)  3    Duration  Progress to 30 minutes of continuous aerobic without signs/symptoms of physical distress      Intensity   THRR 40-80% of Max Heartrate  106-120-134    Ratings of Perceived Exertion  11-13    Perceived Dyspnea  0-4      Progression   Progression  Continue progressive overload as per policy without signs/symptoms or physical distress.      Resistance Training   Training Prescription  Yes    Weight  1    Reps  10-15       Discharge Exercise Prescription (Final Exercise Prescription Changes): Exercise Prescription Changes - 09/05/17 0700      Response to Exercise   Blood Pressure (Admit)  130/54    Blood Pressure (Exercise)  160/62    Blood Pressure (Exit)  122/56    Heart Rate (Admit)  80 bpm    Heart Rate  (Exercise)  113 bpm    Heart Rate (Exit)  82 bpm    Rating of Perceived Exertion (Exercise)  12    Duration  Progress to 30 minutes of  aerobic without signs/symptoms of physical distress    Intensity  THRR unchanged 107-121-134      Progression   Progression  Continue to progress workloads to maintain intensity without signs/symptoms of physical distress.      Resistance Training   Training Prescription  Yes    Weight  1    Reps  10-15      Treadmill   MPH  1.3    Grade  0    Minutes  15    METs  1.9      NuStep   Level  2    SPM  85    Minutes  20    METs  1.8      Home Exercise Plan   Plans to continue exercise at  Home (comment)    Frequency  Add 2 additional days to program exercise sessions.  Initial Home Exercises Provided  08/09/17       Functional Capacity: 6 Minute Walk    Row Name 08/04/17 1408         6 Minute Walk   Phase  Initial     Distance  800 feet     Distance % Change  0 %     Distance Feet Change  0 ft     Walk Time  6 minutes     # of Rest Breaks  0     MPH  1.51     METS  2.16     RPE  15     Perceived Dyspnea   13     VO2 Peak  6.59     Symptoms  No     Resting HR  78 bpm     Resting BP  128/54     Resting Oxygen Saturation   97 %     Exercise Oxygen Saturation  during 6 min walk  84 %     Max Ex. HR  120 bpm     Max Ex. BP  166/84     2 Minute Post BP  140/80        Psychological, QOL, Others - Outcomes: PHQ 2/9: Depression screen PHQ 2/9 08/04/2017  Decreased Interest 0  Down, Depressed, Hopeless 0  PHQ - 2 Score 0  Altered sleeping 1  Tired, decreased energy 1  Change in appetite 0  Feeling bad or failure about yourself  0  Trouble concentrating 0  Moving slowly or fidgety/restless 0  Suicidal thoughts 0  PHQ-9 Score 2  Difficult doing work/chores Somewhat difficult    Quality of Life: Quality of Life - 08/04/17 1412      Quality of Life Scores   Health/Function Pre  23.08 %    Socioeconomic Pre  25.71 %     Psych/Spiritual Pre  27.43 %    Family Pre  30 %    GLOBAL Pre  25.69 %       Personal Goals: Goals established at orientation with interventions provided to work toward goal. Personal Goals and Risk Factors at Admission - 08/04/17 1511      Core Components/Risk Factors/Patient Goals on Admission    Weight Management  Weight Maintenance    Improve shortness of breath with ADL's  Yes    Intervention  Provide education, individualized exercise plan and daily activity instruction to help decrease symptoms of SOB with activities of daily living.    Expected Outcomes  Short Term: Achieves a reduction of symptoms when performing activities of daily living.    Personal Goal Other  Yes    Personal Goal  Improve my health, live longer    Intervention  Attend CR 3 x week and supplement with home exercise 2 x week    Expected Outcomes  achieve personal goals.        Personal Goals Discharge: Goals and Risk Factor Review    Row Name 09/06/17 1327             Core Components/Risk Factors/Patient Goals Review   Personal Goals Review  Weight Management/Obesity;Increase knowledge of respiratory medications and ability to use respiratory devices properly.;Diabetes Improve health; live longer.       Review  Patient has completed 8 sessions maintaining her weight. She has been out for the last 2 weeks due to the weather and hypoglycemic episodes. She saw her Cardiologist. NP decreased her evening Levemir dosage from  40 to 30 U. She increased her Lasix from 20 mg to 40 mg daily. Patient says her balance has improved since she started and she is feeling a little stronger. Will continue to monitor.        Expected Outcomes  Patient will continue to attend sessions and complete the program and meet her personal goals.           Exercise Goals and Review: Exercise Goals    Row Name 08/04/17 1410             Exercise Goals   Increase Physical Activity  Yes       Intervention  Provide advice,  education, support and counseling about physical activity/exercise needs.;Develop an individualized exercise prescription for aerobic and resistive training based on initial evaluation findings, risk stratification, comorbidities and participant's personal goals.       Expected Outcomes  Achievement of increased cardiorespiratory fitness and enhanced flexibility, muscular endurance and strength shown through measurements of functional capacity and personal statement of participant.       Increase Strength and Stamina  Yes       Intervention  Provide advice, education, support and counseling about physical activity/exercise needs.;Develop an individualized exercise prescription for aerobic and resistive training based on initial evaluation findings, risk stratification, comorbidities and participant's personal goals.       Expected Outcomes  Achievement of increased cardiorespiratory fitness and enhanced flexibility, muscular endurance and strength shown through measurements of functional capacity and personal statement of participant.       Able to understand and use rate of perceived exertion (RPE) scale  Yes       Intervention  Provide education and explanation on how to use RPE scale       Expected Outcomes  Long Term:  Able to use RPE to guide intensity level when exercising independently;Short Term: Able to use RPE daily in rehab to express subjective intensity level       Able to understand and use Dyspnea scale  Yes       Intervention  Provide education and explanation on how to use Dyspnea scale       Expected Outcomes  Short Term: Able to use Dyspnea scale daily in rehab to express subjective sense of shortness of breath during exertion;Long Term: Able to use Dyspnea scale to guide intensity level when exercising independently       Knowledge and understanding of Target Heart Rate Range (THRR)  Yes       Intervention  Provide education and explanation of THRR including how the numbers were  predicted and where they are located for reference       Expected Outcomes  Short Term: Able to state/look up THRR       Able to check pulse independently  Yes       Intervention  Provide education and demonstration on how to check pulse in carotid and radial arteries.;Review the importance of being able to check your own pulse for safety during independent exercise       Expected Outcomes  Short Term: Able to explain why pulse checking is important during independent exercise;Long Term: Able to check pulse independently and accurately       Understanding of Exercise Prescription  Yes       Intervention  Provide education, explanation, and written materials on patient's individual exercise prescription       Expected Outcomes  Short Term: Able to explain program exercise prescription;Long Term: Able to explain  home exercise prescription to exercise independently          Nutrition & Weight - Outcomes: Pre Biometrics - 08/04/17 1410      Pre Biometrics   Height  5\' 3"  (1.6 m)    Weight  207 lb (93.9 kg)    Waist Circumference  47.5 inches    Hip Circumference  46.5 inches    Waist to Hip Ratio  1.02 %    BMI (Calculated)  36.68    Triceps Skinfold  20 mm    % Body Fat  47 %    Grip Strength  55.93 kg    Flexibility  13.16 in    Single Leg Stand  1 seconds        Nutrition: Nutrition Therapy & Goals - 09/06/17 1326      Nutrition Therapy   RD appointment defered  Yes      Personal Nutrition Goals   Personal Goal #2  Low Na; Low K    Comments  Patient says she is meeting her personal nutrition goal and also following a diabetic diet.        Nutrition Discharge: Nutrition Assessments - 08/04/17 1511      MEDFICTS Scores   Pre Score  26       Education Questionnaire Score: Knowledge Questionnaire Score - 08/04/17 1502      Knowledge Questionnaire Score   Pre Score  21/24

## 2017-09-22 NOTE — Progress Notes (Signed)
Cardiac Individual Treatment Plan  Patient Details  Name: Teresa Mercado MRN: [id number] Date of Birth: Oct 13, 1945 Referring Provider:     CARDIAC REHAB PHASE II ORIENTATION from 08/04/2017 in [facility]  Referring Provider  Dr. Rockey Situ      Initial Encounter Date:    CARDIAC REHAB PHASE II ORIENTATION from 08/04/2017 in [facility]  Date  08/04/17  Referring Provider  Dr. Rockey Situ      Visit Diagnosis: NSTEMI (non-ST elevated myocardial infarction) [facility])  CHF (congestive heart failure), NYHA class I, chronic, diastolic ([place])  Patient's Home Medications on Admission:  Current Outpatient Medications:  .  albuterol (PROVENTIL HFA;VENTOLIN HFA) 108 (90 Base) MCG/ACT inhaler, Inhale 1-2 puffs into the lungs every 6 (six) hours as needed for wheezing or shortness of breath., Disp: , Rfl:  .  aspirin 81 MG EC tablet, Take 81 mg by mouth daily. Swallow whole., Disp: , Rfl:  .  atorvastatin (LIPITOR) 80 MG tablet, Take 80 mg by mouth daily., Disp: , Rfl:  .  carvedilol (COREG) 3.125 MG tablet, Take 1 tablet (3.125 mg total) by mouth 2 (two) times daily with a meal., Disp: 60 tablet, Rfl: 6 .  cloNIDine (CATAPRES) 0.1 MG tablet, Take 0.1 mg by mouth 2 (two) times daily., Disp: , Rfl:  .  doxycycline (VIBRA-TABS) 100 MG tablet, Take 1 tablet (100 mg total) by mouth 2 (two) times daily. For 5 days, Disp: 10 tablet, Rfl: 0 .  esomeprazole (NEXIUM) 40 MG capsule, Take 1 capsule (40 mg total) by mouth daily., Disp: 90 capsule, Rfl: 3 .  furosemide (LASIX) 40 MG tablet, Take 1 tablet (40 mg) by mouth once daily , Disp: , Rfl:  .  glimepiride (AMARYL) 4 MG tablet, Take 4 mg by mouth daily with breakfast., Disp: , Rfl:  .  insulin aspart (NOVOLOG) 100 UNIT/ML FlexPen, Inject 16 Units into the skin daily with lunch. , Disp: , Rfl:  .  insulin detemir (LEVEMIR) 100 UNIT/ML injection, Inject 40 Units into the skin at bedtime. , Disp: , Rfl:  .   isosorbide-hydrALAZINE (BIDIL) 20-37.5 MG tablet, Take 0.5 tablets by mouth 3 (three) times daily., Disp: 45 tablet, Rfl: 6 .  pioglitazone (ACTOS) 45 MG tablet, Take 45 mg by mouth daily., Disp: , Rfl:  .  vitamin B-12 (CYANOCOBALAMIN) 1000 MCG tablet, Take 1,000 mcg by mouth daily., Disp: , Rfl:   Past Medical History: Past Medical History:  Diagnosis Date  . Chronic combined systolic (congestive) and diastolic (congestive) heart failure ([place])    a. 09/2015 Echo: normal EF 55%; b. 07/2017 Echo: EF 25%, Gr2 DD, mild lvh, mid ant/inf/infsept/apical AK. Triv MR. mildly to mod dil LA.  . CKD (chronic kidney disease), stage III ([place])   . COPD (chronic obstructive pulmonary disease) ([facility])    Not on home o2  . Diabetes mellitus without complication ([place])    On Levemir  . Non-ischemic cardiomyopathy ([place])    a. 07/2017 Echo: EF 25%, Gr2 DD - ? takotsubo.  . Non-obstructive CAD (coronary artery disease)    a. 07/2017 Cath: LM nl, LAD 40ost, D1 50, D2 40, LCX 52m, OM1/2 nl, RCA large, nl.  . Syncope    a.  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    Tobacco Use: Social History   Tobacco Use  Smoking Status Former Smoker  . Packs/day: 2.00  . Types: Cigarettes  . Last attempt to quit: 11/24/2005  . Years since  quitting: 11.8  Smokeless Tobacco Never Used  Tobacco Comment   Quit 10 years ago, but has 26 years smoking prior to that    Labs: Recent Review Flowsheet Data    Labs for ITP Cardiac and Pulmonary Rehab Latest Ref Rng & Units 11/26/2015 07/04/2017 07/20/2017   Hemoglobin A1c 4.8 - 5.6 % 6.6(H) 7.9(H) 7.5(H)      Capillary Blood Glucose: Lab Results  Component Value Date   GLUCAP 169 (H) 07/23/2017   GLUCAP 269 (H) 07/22/2017   GLUCAP 225 (H) 07/22/2017   GLUCAP 234 (H) 07/22/2017   GLUCAP 166 (H) 07/22/2017     Exercise Target Goals:    Exercise Program Goal: Individual exercise prescription set with THRR, safety & activity barriers.  Participant demonstrates ability to understand and report RPE using BORG scale, to self-measure pulse accurately, and to acknowledge the importance of the exercise prescription.  Exercise Prescription Goal: Starting with aerobic activity 30 plus minutes a day, 3 days per week for initial exercise prescription. Provide home exercise prescription and guidelines that participant acknowledges understanding prior to discharge.  Activity Barriers & Risk Stratification: Activity Barriers & Cardiac Risk Stratification - 08/04/17 1409      Activity Barriers & Cardiac Risk Stratification   Activity Barriers  Deconditioning;Muscular Weakness;Shortness of Breath    Cardiac Risk Stratification  High       6 Minute Walk: 6 Minute Walk    Row Name 08/04/17 1408         6 Minute Walk   Phase  Initial     Distance  800 feet     Distance % Change  0 %     Distance Feet Change  0 ft     Walk Time  6 minutes     # of Rest Breaks  0     MPH  1.51     METS  2.16     RPE  15     Perceived Dyspnea   13     VO2 Peak  6.59     Symptoms  No     Resting HR  78 bpm     Resting BP  128/54     Resting Oxygen Saturation   97 %     Exercise Oxygen Saturation  during 6 min walk  84 %     Max Ex. HR  120 bpm     Max Ex. BP  166/84     2 Minute Post BP  140/80        Oxygen Initial Assessment: Oxygen Initial Assessment - 08/04/17 1505      Home Oxygen   Home Oxygen Device  Home Concentrator    Sleep Oxygen Prescription  Continuous    Liters per minute  2    Home Exercise Oxygen Prescription  None    Home at Rest Exercise Oxygen Prescription  None    Compliance with Home Oxygen Use  Yes      Initial 6 min Walk   Oxygen Used  None      Program Oxygen Prescription   Program Oxygen Prescription  None       Oxygen Re-Evaluation:   Oxygen Discharge (Final Oxygen Re-Evaluation):   Initial Exercise Prescription: Initial Exercise Prescription - 08/04/17 1400      Date of Initial Exercise RX  and Referring Provider   Date  08/04/17    Referring Provider  Dr. Rockey Situ      Treadmill   MPH  1  Grade  0    Minutes  15    METs  1.7      NuStep   Level  2    SPM  57    Minutes  20    METs  1.6      Prescription Details   Frequency (times per week)  3    Duration  Progress to 30 minutes of continuous aerobic without signs/symptoms of physical distress      Intensity   THRR 40-80% of Max Heartrate  106-120-134    Ratings of Perceived Exertion  11-13    Perceived Dyspnea  0-4      Progression   Progression  Continue progressive overload as per policy without signs/symptoms or physical distress.      Resistance Training   Training Prescription  Yes    Weight  1    Reps  10-15       Perform Capillary Blood Glucose checks as needed.  Exercise Prescription Changes:  Exercise Prescription Changes    Row Name 08/14/17 0900 08/16/17 1400 09/05/17 0700         Response to Exercise   Blood Pressure (Admit)  120/50  120/50  130/54     Blood Pressure (Exercise)  162/56  162/56  160/62     Blood Pressure (Exit)  116/50  116/50  122/56     Heart Rate (Admit)  80 bpm  80 bpm  80 bpm     Heart Rate (Exercise)  114 bpm  114 bpm  113 bpm     Heart Rate (Exit)  82 bpm  82 bpm  82 bpm     Rating of Perceived Exertion (Exercise)  9  9  12      Duration  Progress to 30 minutes of  aerobic without signs/symptoms of physical distress  Progress to 30 minutes of  aerobic without signs/symptoms of physical distress  Progress to 30 minutes of  aerobic without signs/symptoms of physical distress     Intensity  THRR New 107-121-134  THRR New 107-121-134  THRR unchanged 107-121-134       Progression   Progression  Continue to progress workloads to maintain intensity without signs/symptoms of physical distress.  Continue to progress workloads to maintain intensity without signs/symptoms of physical distress.  Continue to progress workloads to maintain intensity without signs/symptoms of  physical distress.       Resistance Training   Training Prescription  Yes  Yes  Yes     Weight  1 2  1 2  1      Reps  10-15  10-15  10-15       Treadmill   MPH  1  1  1.3     Grade  0  0  0     Minutes  15  15  15      METs  1.7  1.7  1.9       NuStep   Level  2  2  2      SPM  60  60  85     Minutes  20  20  20      METs  1.6  1.6  1.8       Home Exercise Plan   Plans to continue exercise at  Home (comment)  Home (comment)  Home (comment)     Frequency  Add 2 additional days to program exercise sessions.  Add 2 additional days to program exercise sessions.  Add 2 additional days  to program exercise sessions.     Initial Home Exercises Provided  08/09/17  08/09/17  08/09/17        Exercise Comments:  Exercise Comments    Row Name 08/16/17 1403 09/05/17 0757         Exercise Comments  Patient was given take home exercise plan 08/09/2017. THR was addressed as were safety guidelines for being active outside of CR. Patient demonstrated an understanding and was encouraged to ask any future questions as they arise.  Patient is doing well in CR. She has increased her speed on the treadmill as well as her SPMs on the Nustep while maintaining her level. She feels that this program is helping her improve her health. There has been progression but lack of attendance may hinder more over time.          Exercise Goals and Review:  Exercise Goals    Row Name 08/04/17 1410             Exercise Goals   Increase Physical Activity  Yes       Intervention  Provide advice, education, support and counseling about physical activity/exercise needs.;Develop an individualized exercise prescription for aerobic and resistive training based on initial evaluation findings, risk stratification, comorbidities and participant's personal goals.       Expected Outcomes  Achievement of increased cardiorespiratory fitness and enhanced flexibility, muscular endurance and strength shown through measurements  of functional capacity and personal statement of participant.       Increase Strength and Stamina  Yes       Intervention  Provide advice, education, support and counseling about physical activity/exercise needs.;Develop an individualized exercise prescription for aerobic and resistive training based on initial evaluation findings, risk stratification, comorbidities and participant's personal goals.       Expected Outcomes  Achievement of increased cardiorespiratory fitness and enhanced flexibility, muscular endurance and strength shown through measurements of functional capacity and personal statement of participant.       Able to understand and use rate of perceived exertion (RPE) scale  Yes       Intervention  Provide education and explanation on how to use RPE scale       Expected Outcomes  Long Term:  Able to use RPE to guide intensity level when exercising independently;Short Term: Able to use RPE daily in rehab to express subjective intensity level       Able to understand and use Dyspnea scale  Yes       Intervention  Provide education and explanation on how to use Dyspnea scale       Expected Outcomes  Short Term: Able to use Dyspnea scale daily in rehab to express subjective sense of shortness of breath during exertion;Long Term: Able to use Dyspnea scale to guide intensity level when exercising independently       Knowledge and understanding of Target Heart Rate Range (THRR)  Yes       Intervention  Provide education and explanation of THRR including how the numbers were predicted and where they are located for reference       Expected Outcomes  Short Term: Able to state/look up THRR       Able to check pulse independently  Yes       Intervention  Provide education and demonstration on how to check pulse in carotid and radial arteries.;Review the importance of being able to check your own pulse for safety during independent exercise  Expected Outcomes  Short Term: Able to explain why  pulse checking is important during independent exercise;Long Term: Able to check pulse independently and accurately       Understanding of Exercise Prescription  Yes       Intervention  Provide education, explanation, and written materials on patient's individual exercise prescription       Expected Outcomes  Short Term: Able to explain program exercise prescription;Long Term: Able to explain home exercise prescription to exercise independently          Exercise Goals Re-Evaluation : Exercise Goals Re-Evaluation    Row Name 08/14/17 0953 09/05/17 0755           Exercise Goal Re-Evaluation   Exercise Goals Review  Increase Physical Activity;Increase Strength and Stamina;Knowledge and understanding of Target Heart Rate Range (THRR)  Increase Physical Activity;Increase Strength and Stamina;Knowledge and understanding of Target Heart Rate Range (THRR)      Comments  Patient is doing well in CR. She has just started CR and will be progressed in time.   Patient is doing well in CR. She has increased her speed on the treadmill as well as her SPMs on the Nustep while maintaining her level. She feels that this program is helping her improve her health. There has been progression but lack of attendance may hinder more over time.       Expected Outcomes  Patient wishes to improve her health and to live longer.   [place] wishes to improve her health and to live longer.           Discharge Exercise Prescription (Final Exercise Prescription Changes): Exercise Prescription Changes - 09/05/17 0700      Response to Exercise   Blood Pressure (Admit)  130/54    Blood Pressure (Exercise)  160/62    Blood Pressure (Exit)  122/56    Heart Rate (Admit)  80 bpm    Heart Rate (Exercise)  113 bpm    Heart Rate (Exit)  82 bpm    Rating of Perceived Exertion (Exercise)  12    Duration  Progress to 30 minutes of  aerobic without signs/symptoms of physical distress    Intensity  THRR unchanged 107-121-134       Progression   Progression  Continue to progress workloads to maintain intensity without signs/symptoms of physical distress.      Resistance Training   Training Prescription  Yes    Weight  1    Reps  10-15      Treadmill   MPH  1.3    Grade  0    Minutes  15    METs  1.9      NuStep   Level  2    SPM  85    Minutes  20    METs  1.8      Home Exercise Plan   Plans to continue exercise at  Home (comment)    Frequency  Add 2 additional days to program exercise sessions.    Initial Home Exercises Provided  08/09/17       Nutrition:  Target Goals: Understanding of nutrition guidelines, daily intake of sodium 1500mg , cholesterol 200mg , calories 30% from fat and 7% or less from saturated fats, daily to have 5 or more servings of fruits and vegetables.  Biometrics: Pre Biometrics - 08/04/17 1410      Pre Biometrics   Height  5\' 3"  (1.6 m)    Weight  207 lb (93.9 kg)  Waist Circumference  47.5 inches    Hip Circumference  46.5 inches    Waist to Hip Ratio  1.02 %    BMI (Calculated)  36.68    Triceps Skinfold  20 mm    % Body Fat  47 %    Grip Strength  55.93 kg    Flexibility  13.16 in    Single Leg Stand  1 seconds        Nutrition Therapy Plan and Nutrition Goals: Nutrition Therapy & Goals - 09/06/17 1326      Nutrition Therapy   RD appointment defered  Yes      Personal Nutrition Goals   Personal Goal #2  Low Na; Low K    Comments  Patient says she is meeting her personal nutrition goal and also following a diabetic diet.        Nutrition Discharge: Rate Your Plate Scores: Nutrition Assessments - 08/04/17 1511      MEDFICTS Scores   Pre Score  26       Nutrition Goals Re-Evaluation:   Nutrition Goals Discharge (Final Nutrition Goals Re-Evaluation):   Psychosocial: Target Goals: Acknowledge presence or absence of significant depression and/or stress, maximize coping skills, provide positive support system. Participant is able to verbalize  types and ability to use techniques and skills needed for reducing stress and depression.  Initial Review & Psychosocial Screening: Initial Psych Review & Screening - 08/04/17 1513      Initial Review   Current issues with  None Identified      Family Dynamics   Good Support System?  Yes      Barriers   Psychosocial barriers to participate in program  There are no identifiable barriers or psychosocial needs.      Screening Interventions   Interventions  Encouraged to exercise       Quality of Life Scores: Quality of Life - 08/04/17 1412      Quality of Life Scores   Health/Function Pre  23.08 %    Socioeconomic Pre  25.71 %    Psych/Spiritual Pre  27.43 %    Family Pre  30 %    GLOBAL Pre  25.69 %       PHQ-9: Recent Review Flowsheet Data    Depression screen [facility] 2/9 08/04/2017   Decreased Interest 0   Down, Depressed, Hopeless 0   PHQ - 2 Score 0   Altered sleeping 1   Tired, decreased energy 1   Change in appetite 0   Feeling bad or failure about yourself  0   Trouble concentrating 0   Moving slowly or fidgety/restless 0   Suicidal thoughts 0   PHQ-9 Score 2   Difficult doing work/chores Somewhat difficult     Interpretation of Total Score  Total Score Depression Severity:  1-4 = Minimal depression, 5-9 = Mild depression, 10-14 = Moderate depression, 15-19 = Moderately severe depression, 20-27 = Severe depression   Psychosocial Evaluation and Intervention: Psychosocial Evaluation - 08/04/17 1514      Psychosocial Evaluation & Interventions   Interventions  Encouraged to exercise with the program and follow exercise prescription    Continue Psychosocial Services   No Follow up required       Psychosocial Re-Evaluation: Psychosocial Re-Evaluation    [place] Name 08/04/17 1514 09/06/17 1335           Psychosocial Re-Evaluation   Current issues with  None Identified  None Identified      Comments  -  Patient initial QOL score was 25.69 and her PHQ-9  score was 2. No issues identified.       Expected Outcomes  -  Patient will have no psychosocial issues identified at discharge.       Interventions  -  Encouraged to attend Cardiac Rehabilitation for the exercise;Stress management education;Relaxation education      Continue Psychosocial Services   No Follow up required  No Follow up required         Psychosocial Discharge (Final Psychosocial Re-Evaluation): Psychosocial Re-Evaluation - 09/06/17 1335      Psychosocial Re-Evaluation   Current issues with  None Identified    Comments  Patient initial QOL score was 25.69 and her PHQ-9 score was 2. No issues identified.     Expected Outcomes  Patient will have no psychosocial issues identified at discharge.     Interventions  Encouraged to attend Cardiac Rehabilitation for the exercise;Stress management education;Relaxation education    Continue Psychosocial Services   No Follow up required       Vocational Rehabilitation: Provide vocational rehab assistance to qualifying candidates.   Vocational Rehab Evaluation & Intervention: Vocational Rehab - 08/04/17 1502      Initial Vocational Rehab Evaluation & Intervention   Assessment shows need for Vocational Rehabilitation  No       Education: Education Goals: Education classes will be provided on a weekly basis, covering required topics. Participant will state understanding/return demonstration of topics presented.  Learning Barriers/Preferences: Learning Barriers/Preferences - 08/04/17 1502      Learning Barriers/Preferences   Learning Barriers  None    Learning Preferences  Video;Written Material;Pictoral       Education Topics: Hypertension, Hypertension Reduction -Define heart disease and high blood pressure. Discus how high blood pressure affects the body and ways to reduce high blood pressure.   Exercise and Your Heart -Discuss why it is important to exercise, the FITT principles of exercise, normal and abnormal  responses to exercise, and how to exercise safely.   Angina -Discuss definition of angina, causes of angina, treatment of angina, and how to decrease risk of having angina.   CARDIAC REHAB PHASE II EXERCISE from 08/23/2017 in [facility]  Date  08/09/17  Educator  D. Coad  Instruction Review Code  2- Demonstrated Understanding      Cardiac Medications -Review what the following cardiac medications are used for, how they affect the body, and side effects that may occur when taking the medications.  Medications include Aspirin, Beta blockers, calcium channel blockers, ACE Inhibitors, angiotensin receptor blockers, diuretics, digoxin, and antihyperlipidemics.   CARDIAC REHAB PHASE II EXERCISE from 08/23/2017 in [facility]  Date  08/16/17  Educator  DC  Instruction Review Code  2- Demonstrated Understanding      Congestive Heart Failure -Discuss the definition of CHF, how to live with CHF, the signs and symptoms of CHF, and how keep track of weight and sodium intake.   CARDIAC REHAB PHASE II EXERCISE from 08/23/2017 in [facility]  Date  08/23/17  Educator  DC  Instruction Review Code  2- Demonstrated Understanding      Heart Disease and Intimacy -Discus the effect sexual activity has on the heart, how changes occur during intimacy as we age, and safety during sexual activity.   Smoking Cessation / COPD -Discuss different methods to quit smoking, the health benefits of quitting smoking, and the definition of COPD.   Nutrition I: Fats -Discuss the types of  cholesterol, what cholesterol does to the heart, and how cholesterol levels can be controlled.   Nutrition II: Labels -Discuss the different components of food labels and how to read food label   Heart Parts and Heart Disease -Discuss the anatomy of the heart, the pathway of blood circulation through the heart, and these are affected by heart  disease.   Stress I: Signs and Symptoms -Discuss the causes of stress, how stress may lead to anxiety and depression, and ways to limit stress.   Stress II: Relaxation -Discuss different types of relaxation techniques to limit stress.   Warning Signs of Stroke / TIA -Discuss definition of a stroke, what the signs and symptoms are of a stroke, and how to identify when someone is having stroke.   Knowledge Questionnaire Score: Knowledge Questionnaire Score - 08/04/17 1502      Knowledge Questionnaire Score   Pre Score  21/24       Core Components/Risk Factors/Patient Goals at Admission: Personal Goals and Risk Factors at Admission - 08/04/17 1511      Core Components/Risk Factors/Patient Goals on Admission    Weight Management  Weight Maintenance    Improve shortness of breath with ADL's  Yes    Intervention  Provide education, individualized exercise plan and daily activity instruction to help decrease symptoms of SOB with activities of daily living.    Expected Outcomes  Short Term: Achieves a reduction of symptoms when performing activities of daily living.    Personal Goal Other  Yes    Personal Goal  Improve my health, live longer    Intervention  Attend CR 3 x week and supplement with home exercise 2 x week    Expected Outcomes  achieve personal goals.       Core Components/Risk Factors/Patient Goals Review:  Goals and Risk Factor Review    Row Name 09/06/17 1327             Core Components/Risk Factors/Patient Goals Review   Personal Goals Review  Weight Management/Obesity;Increase knowledge of respiratory medications and ability to use respiratory devices properly.;Diabetes Improve health; live longer.       Review  Patient has completed 8 sessions maintaining her weight. She has been out for the last 2 weeks due to the weather and hypoglycemic episodes. She saw her Cardiologist. NP decreased her evening Levemir dosage from 40 to 30 U. She increased her Lasix  from 20 mg to 40 mg daily. Patient says her balance has improved since she started and she is feeling a little stronger. Will continue to monitor.        Expected Outcomes  Patient will continue to attend sessions and complete the program and meet her personal goals.           Core Components/Risk Factors/Patient Goals at Discharge (Final Review):  Goals and Risk Factor Review - 09/06/17 1327      Core Components/Risk Factors/Patient Goals Review   Personal Goals Review  Weight Management/Obesity;Increase knowledge of respiratory medications and ability to use respiratory devices properly.;Diabetes Improve health; live longer.    Review  Patient has completed 8 sessions maintaining her weight. She has been out for the last 2 weeks due to the weather and hypoglycemic episodes. She saw her Cardiologist. NP decreased her evening Levemir dosage from 40 to 30 U. She increased her Lasix from 20 mg to 40 mg daily. Patient says her balance has improved since she started and she is feeling a little stronger. Will continue to  monitor.     Expected Outcomes  Patient will continue to attend sessions and complete the program and meet her personal goals.        ITP Comments: ITP Comments    Row Name 08/04/17 1503 08/16/17 1249 09/22/17 1425       ITP Comments  Mrs. Kotowski is a pleasant 72 year old woman. She is very interested in being in our program. She has COPD w/emphysema. She only uses O2 at night during sleep at 2L.   Patient new to program. He has completed 4 sessions. Will continue to monitor for progress.   Patient stopped attending the program after completing 8 sessions due to illness and lack of attendance. MD will be notified.         Comments: Patient stopped coming to Cardiac Rehab on 08/25/2017 after completing 8 sessions due to illness and lack of attendance. Called patient to remind them of the Cardiac Rehab policy that if they do not call or come for 3 consecutive visits that they would  be discharged from the CR program. Doctor will be informed.

## 2017-09-22 NOTE — Addendum Note (Signed)
Encounter addended by: Dwana Melena, RN on: 09/22/2017 2:30 PM  Actions taken: Visit Navigator Flowsheet section accepted, Sign clinical note, Episode resolved

## 2017-09-25 ENCOUNTER — Encounter (HOSPITAL_COMMUNITY): Payer: Medicare Other

## 2017-09-27 ENCOUNTER — Encounter (HOSPITAL_COMMUNITY): Payer: Medicare Other

## 2017-09-29 ENCOUNTER — Encounter (HOSPITAL_COMMUNITY): Payer: Medicare Other

## 2017-10-02 ENCOUNTER — Encounter (HOSPITAL_COMMUNITY): Payer: Medicare Other

## 2017-10-04 ENCOUNTER — Encounter (HOSPITAL_COMMUNITY): Payer: Medicare Other

## 2017-10-06 ENCOUNTER — Encounter (HOSPITAL_COMMUNITY): Payer: Medicare Other

## 2017-10-08 NOTE — Progress Notes (Signed)
Cardiology Office Note  Date:  10/10/2017   ID:  Teresa Mercado, DOB 1945/11/02, MRN [id number]  PCP:  Idelle Crouch, MD   Chief Complaint  Patient presents with  . Other    1 month follwo up. Patient c/o swelling in ankles. Patient denies chet pain and SOB. Meds reviewed verbally with patient.     HPI:  Teresa Mercado is a 72 y.o. female with a known history of  diastolic CHF,  COPD,  diabetes mellitus,  CKD stage III with baseline creatinine around 1.6 to 1.8,  complete heart block with a pacemaker (initially presenting with syncope),   GI bleed, hematocrit 6.9 requiring transfusion 2  Who presents today for routine follow-up of her chronic diastolic CHF,  Previous hospital  Admission November 2018  For chest pain, elevated toponin,  Nonischemic cardiomyopathy   hospital November 2018  Echocardiogram with ejection fraction 25% Hospital records reviewed with the patient in detail Presented with chest pain chronic diastolic CHF, poor control diabetes, chronic kidney disease Climb in troponin up to 2.66 Felt to have stress cardiomyopathy ,  Cardiac cath 07/21/2017 1. 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 2. Mildly elevated left ventricular filling pressure.  1. Degree of cardiomyopathy (LVEF 25% by echo) is out of proportion to CAD. 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 Start low-dose carvedilol. 3. Medical therapy and risk factor modification to prevent progression of CAD. 4. Consider gentle diuresis tomorrow, as renal function allows. Will hold off on diuresis tonight given CKD and contrast exposure.   In general she reports that she Feels well,   denies any significant elg swelling  previously on Lasix 20 daily , now taking Lasix 40  Daily  chronic SOB when walking,  wears compression hose in the winter,     No regular exercise program, in fact is very sedentary Some leg weakness  Seen by Dr. Caryl Comes May 2017, device  interrogated  Echocardiogram January 2017 Ejection fraction greater than 55%, No significant valve disease  Weight down 219, down from 227, previously from 247  EKG on today's visit shows paced rhythm rate 71 bpm  Other past medical history reviewed Previous EGD showing gastritis, erosions . Previously had diarrhea  EKG dated 11/25/2015 shows ventricular paced rhythm  Other past medical history   pacer placed in the past for sick sinus syndrome, reportedly had 10 second pause per the patient   PMH:   has a past medical history of Chronic combined systolic (congestive) and diastolic (congestive) heart failure ([place]), CKD (chronic kidney disease), stage III ([place]), COPD (chronic obstructive pulmonary disease) ([place]), Diabetes mellitus without complication ([place]), Non-ischemic cardiomyopathy ([place]), Non-obstructive CAD (coronary artery disease), and Syncope.  PSH:    Past Surgical History:  Procedure Laterality Date  . APPENDECTOMY    . ESOPHAGOGASTRODUODENOSCOPY N/A 11/27/2015   Procedure: ESOPHAGOGASTRODUODENOSCOPY (EGD);  Surgeon: Lollie Sails, MD;  Location[facility] END[facility]COPY;  Service: Endoscopy;  Laterality: N/A;  . LEFT HEART CATH AND CORONARY ANGIOGRAPHY N/A 07/21/2017   Procedure: LEFT HEART CATH AND CORONARY ANGIOGRAPHY;  Surgeon: [name], MD;  Location: [facility];  Service: Cardiovascular;  Laterality: N/A;  . PACEMAKER INSERTION    . TONSILLECTOMY    . TUBAL LIGATION      Current Outpatient Medications  Medication Sig Dispense Refill  . albuterol (PROVENTIL HFA;VENTOLIN HFA) 108 (90 Base) MCG/ACT inhaler Inhale 1-2 puffs into the lungs every 6 (six) hours as needed for wheezing or shortness of breath.    Marland Kitchen  aspirin 81 MG EC tablet Take 81 mg by mouth daily. Swallow whole.    Marland Kitchen atorvastatin (LIPITOR) 80 MG tablet Take 80 mg by mouth daily.    . carvedilol (COREG) 3.125 MG tablet Take 1 tablet (3.125 mg total) by mouth 2 (two) times daily with a meal. 60  tablet 6  . cloNIDine (CATAPRES) 0.1 MG tablet Take 0.1 mg by mouth 2 (two) times daily.    Marland Kitchen doxycycline (VIBRA-TABS) 100 MG tablet Take 1 tablet (100 mg total) by mouth 2 (two) times daily. For 5 days 10 tablet 0  . esomeprazole (NEXIUM) 40 MG capsule Take 1 capsule (40 mg total) by mouth daily. 90 capsule 3  . furosemide (LASIX) 40 MG tablet Take 1 tablet (40 mg) by mouth once daily     . insulin aspart (NOVOLOG) 100 UNIT/ML FlexPen Inject 16 Units into the skin daily with lunch.     . insulin detemir (LEVEMIR) 100 UNIT/ML injection Inject 40 Units into the skin at bedtime.     . isosorbide-hydrALAZINE (BIDIL) 20-37.5 MG tablet Take 0.5 tablets by mouth 3 (three) times daily. 45 tablet 6  . pioglitazone (ACTOS) 45 MG tablet Take 45 mg by mouth daily.    . vitamin B-12 (CYANOCOBALAMIN) 1000 MCG tablet Take 1,000 mcg by mouth daily.     No current facility-administered medications for this visit.      Allergies:   Quinapril; Venlafaxine; and Zetia [ezetimibe]   Social History:  The patient  reports that she quit smoking about 11 years ago. Her smoking use included cigarettes. She smoked 2.00 packs per day. she has never used smokeless tobacco. She reports that she does not drink alcohol or use drugs.   Family History:   family history includes Esophageal cancer in her brother; Kidney cancer in her father.    Review of Systems: Review of Systems  Constitutional: Negative.   Respiratory: Positive for shortness of breath.   Cardiovascular: Negative.   Gastrointestinal: Negative.   Musculoskeletal: Negative.        Gait instability  Neurological: Negative.   Psychiatric/Behavioral: Negative.   All other systems reviewed and are negative.    PHYSICAL EXAM: VS:  BP (!) 145/69 (BP Location: Left Arm, Patient Position: Sitting, Cuff Size: Large)   Pulse 81   Ht 5\' 3"  (1.6 m)   Wt 222 lb 4 oz (100.8 kg)   BMI 39.37 kg/m  , BMI Body mass index is 39.37 kg/m. GEN: Well nourished,  well developed, in no acute distress , obese HEENT: normal  Neck: no JVD, carotid bruits, or masses Cardiac: RRR; no murmurs, rubs, or gallops,nonpitting leg edema , compression hose in place Respiratory:  clear to auscultation bilaterally, normal work of breathing GI: soft, nontender, nondistended, + BS MS: no deformity or atrophy  Skin: warm and dry, no rash Neuro:  Strength and sensation are intact Psych: euthymic mood, full affect    Recent Labs: 07/20/2017: B Natriuretic Peptide 122.0 09/13/2017: BUN 41; Creatinine, Ser 1.94; Hemoglobin 10.7; Platelets 344; Potassium 5.2; Sodium 137    Lipid Panel No results found for: CHOL, HDL, LDLCALC, TRIG    Wt Readings from Last 3 Encounters:  10/10/17 222 lb 4 oz (100.8 kg)  09/13/17 213 lb 12 oz (97 kg)  09/05/17 218 lb 8 oz (99.1 kg)       ASSESSMENT AND PLAN:   Chronic systolic and diastolic CHF (congestive heart failure) (HCC) -  Currently taking 40 mg Lasix daily Creatinine high end  of range 1.9 We'll try to avoid higher doses of Lasix Previous he was elevated potassium, discussed low potassium diet  Morbid obesity due to excess calories (HCC) Weight does not seem to correlate to fluid status given worsening renal function, higher weight. Discussed low carbohydrate diet  Shortness of breath Chronic shortness of breath, deconditioning, morbid obesity Recommended walking program  Poorly controlled type 2 diabetes mellitus ([place]) Currently working with Dr. Doy Hutching and endocrinology Dietary indiscretion  Nonischemic cardiomyopathy Echocardiogram 3 months ago with ejection fraction 25%, possible stress cardiomyopathy  Appears euvolemic on today's visit Some medication confusion, recommended she call us back to confirm medications We'll also check blood pressure at home and if running high we will increase dose of BiDil Repeat echocardiogram and January 2019 , 3 months after initial diagnosis   Complete heart block  [facility]) Pacemaker, followed by Dr. Caryl Comes  Chronic renal failure  Creatinine stable 1.9 Kind of range, will avoid overdiuresis Avoid NSAIDs  Essential hypertension Blood pressure mildly elevated, she will call us with more blood pressure numbers from home for further medication adjustment    Total encounter time more than 45 minutes  Greater than 50% was spent in counseling and coordination of care with the patient   Disposition:   F/U  6 months   Orders Placed This Encounter  Procedures  . EKG 12-Lead     Signed, Esmond Plants, M.D., Ph.D. 10/10/2017  [place], [place]

## 2017-10-09 ENCOUNTER — Encounter (HOSPITAL_COMMUNITY): Payer: Medicare Other

## 2017-10-10 ENCOUNTER — Ambulatory Visit (INDEPENDENT_AMBULATORY_CARE_PROVIDER_SITE_OTHER): Payer: Medicare Other | Admitting: Cardiovascular Disease

## 2017-10-10 ENCOUNTER — Encounter: Payer: Self-pay | Admitting: Cardiovascular Disease

## 2017-10-10 VITALS — BP 145/69 | HR 81 | Ht 63.0 in | Wt 222.2 lb

## 2017-10-10 DIAGNOSIS — R600 Localized edema: Secondary | ICD-10-CM

## 2017-10-10 DIAGNOSIS — N183 Chronic kidney disease, stage 3 unspecified: Secondary | ICD-10-CM

## 2017-10-10 DIAGNOSIS — I5032 Chronic diastolic (congestive) heart failure: Secondary | ICD-10-CM | POA: Diagnosis not present

## 2017-10-10 DIAGNOSIS — R079 Chest pain, unspecified: Secondary | ICD-10-CM

## 2017-10-10 DIAGNOSIS — R0602 Shortness of breath: Secondary | ICD-10-CM

## 2017-10-10 DIAGNOSIS — I442 Atrioventricular block, complete: Secondary | ICD-10-CM | POA: Diagnosis not present

## 2017-10-10 DIAGNOSIS — Z950 Presence of cardiac pacemaker: Secondary | ICD-10-CM

## 2017-10-10 DIAGNOSIS — E1165 Type 2 diabetes mellitus with hyperglycemia: Secondary | ICD-10-CM

## 2017-10-10 NOTE — Patient Instructions (Addendum)
Medication Instructions:   Call me and let me know what pill you are cutting in 1/2  Monitor blood pressure If it runs >130 on the top number, I might increase the dose of bidil (isosorbide/hydralazine combo) Up to a full pill  Labwork:  No new labs needed  Testing/Procedures:  Your physician has requested that you have an echocardiogram. Echocardiography is a painless test that uses sound waves to create images of your heart. It provides your doctor with information about the size and shape of your heart and how well your heart's chambers and valves are working. This procedure takes approximately one hour. There are no restrictions for this procedure.     Follow-Up: It was a pleasure seeing you in the office today. Please call us if you have new issues that need to be addressed before your next appt.  [phone]  Your physician wants you to follow-up in: 6 months.  You will receive a reminder letter in the mail two months in advance. If you don't receive a letter, please call our office to schedule the follow-up appointment.  If you need a refill on your cardiac medications before your next appointment, please call your pharmacy.

## 2017-10-11 ENCOUNTER — Encounter (HOSPITAL_COMMUNITY): Payer: Medicare Other

## 2017-10-11 ENCOUNTER — Other Ambulatory Visit: Payer: Self-pay

## 2017-10-11 ENCOUNTER — Telehealth: Payer: Self-pay | Admitting: Cardiovascular Disease

## 2017-10-11 NOTE — Telephone Encounter (Signed)
Per 1/22 AVS: "Medication Instructions:   Call me and let me know what pill you are cutting in 1/2  Monitor blood pressure If it runs >130 on the top number, I might increase the dose of bidil (isosorbide/hydralazine combo) Up to a full pill"  Pt called back to report she takes 1/2 clonidine BID stating this is what prescription bottle shows. Pt did not have a working BP cuff prior to today therefore no BP readings to report. She understands to monitor BP daily.  Epic medication list has clonidine .01mg  (1 tablet) BID. Will update medication list and route to MD to make aware.

## 2017-10-11 NOTE — Telephone Encounter (Signed)
Patient calling in regards to the medication cloNIDine  Please call to discuss

## 2017-10-13 ENCOUNTER — Encounter (HOSPITAL_COMMUNITY): Payer: Medicare Other

## 2017-10-16 ENCOUNTER — Encounter (HOSPITAL_COMMUNITY): Payer: Medicare Other

## 2017-10-18 ENCOUNTER — Other Ambulatory Visit: Payer: Self-pay | Admitting: Cardiovascular Disease

## 2017-10-18 ENCOUNTER — Encounter (HOSPITAL_COMMUNITY): Payer: Medicare Other

## 2017-10-18 DIAGNOSIS — R079 Chest pain, unspecified: Secondary | ICD-10-CM

## 2017-10-18 DIAGNOSIS — Z950 Presence of cardiac pacemaker: Secondary | ICD-10-CM

## 2017-10-18 DIAGNOSIS — I442 Atrioventricular block, complete: Secondary | ICD-10-CM

## 2017-10-18 DIAGNOSIS — R0602 Shortness of breath: Secondary | ICD-10-CM

## 2017-10-18 DIAGNOSIS — R600 Localized edema: Secondary | ICD-10-CM

## 2017-10-18 DIAGNOSIS — I5032 Chronic diastolic (congestive) heart failure: Secondary | ICD-10-CM

## 2017-10-20 ENCOUNTER — Encounter (HOSPITAL_COMMUNITY): Payer: Medicare Other

## 2017-10-23 ENCOUNTER — Encounter (HOSPITAL_COMMUNITY): Payer: Medicare Other

## 2017-10-25 ENCOUNTER — Encounter (HOSPITAL_COMMUNITY): Payer: Medicare Other

## 2017-10-27 ENCOUNTER — Encounter (HOSPITAL_COMMUNITY): Payer: Medicare Other

## 2017-11-02 ENCOUNTER — Ambulatory Visit (INDEPENDENT_AMBULATORY_CARE_PROVIDER_SITE_OTHER): Payer: Medicare Other

## 2017-11-02 ENCOUNTER — Other Ambulatory Visit: Payer: Self-pay

## 2017-11-02 DIAGNOSIS — I442 Atrioventricular block, complete: Secondary | ICD-10-CM

## 2017-11-02 DIAGNOSIS — I5032 Chronic diastolic (congestive) heart failure: Secondary | ICD-10-CM

## 2017-11-02 DIAGNOSIS — Z950 Presence of cardiac pacemaker: Secondary | ICD-10-CM

## 2017-11-02 DIAGNOSIS — R600 Localized edema: Secondary | ICD-10-CM

## 2017-11-02 DIAGNOSIS — R0602 Shortness of breath: Secondary | ICD-10-CM

## 2017-11-02 DIAGNOSIS — R079 Chest pain, unspecified: Secondary | ICD-10-CM

## 2017-11-03 LAB — ECHOCARDIOGRAM LIMITED
Area-P 1/2: 5 cm2
CHL CUP MV DEC (S): 151
E/e' ratio: 36.78
EWDT: 151 ms
FS: 27 % — AB (ref 28–44)
IVS/LV PW RATIO, ED: 0.92
LV PW d: 13 mm — AB (ref 0.6–1.1)
LV TDI E'LATERAL: 3.97
LV e' LATERAL: 3.97 cm/s
LVEEAVG: 36.78
LVEEMED: 36.78
MV pk E vel: 146 m/s
MVPG: 9 mmHg
MVPKAVEL: 114 m/s
MVSPHT: 44 ms
TDI e' medial: 5.11

## 2018-02-25 ENCOUNTER — Other Ambulatory Visit: Payer: Self-pay | Admitting: Cardiovascular Disease

## 2018-03-15 ENCOUNTER — Telehealth: Payer: Self-pay | Admitting: Cardiovascular Disease

## 2018-03-15 ENCOUNTER — Other Ambulatory Visit: Payer: Self-pay | Admitting: *Deleted

## 2018-03-15 MED ORDER — ISOSORB DINITRATE-HYDRALAZINE 20-37.5 MG PO TABS: 0.5000 | ORAL_TABLET | Freq: Three times a day (TID) | ORAL | 2 refills | Status: DC

## 2018-03-15 NOTE — Telephone Encounter (Signed)
°*  STAT* If patient is at the pharmacy, call can be transferred to refill team.   1. Which medications need to be refilled? (please list name of each medication and dose if known) BIDIL 20-37.5 MG take 1/2 tablet by mouth three times daily  2. Which pharmacy/location (including street and city if local pharmacy) is medication to be sent to? [facility] in [place]   3. Do they need a 30 day or 90 day supply? 30 day

## 2018-03-15 NOTE — Telephone Encounter (Signed)
Requested Prescriptions   Signed Prescriptions Disp Refills  . isosorbide-hydrALAZINE (BIDIL) 20-37.5 MG tablet 135 tablet 2    Sig: Take 0.5 tablets by mouth 3 (three) times daily.    Authorizing Provider: Minna Merritts    Ordering User: Britt Bottom

## 2018-04-11 NOTE — Progress Notes (Signed)
Cardiology Office Note  Date:  04/12/2018   ID:  Teresa Mercado, DOB 18-May-1946, MRN [id number]  PCP:  Idelle Crouch, MD   Chief Complaint  Patient presents with  . other    6 month follow up. Meds reviewed by the pt. verbally. Pt. c/o shortness of breath with LE edema.     HPI:  Teresa Mercado is a 72 y.o. female with a known history of  diastolic CHF,  COPD,  diabetes mellitus,  CKD stage III with baseline creatinine around 1.6 to 1.8,  complete heart block with a pacemaker (initially presenting with syncope),   GI bleed, hematocrit 6.9 requiring transfusion 2  Who presents today for routine follow-up of her chronic diastolic CHF,  Previous hospital  Admission November 2018  For chest pain, elevated toponin,  Nonischemic cardiomyopathy, catheterization with mild to moderate nonobstructive disease She presents today for follow-up of her cardiomyopathy  Does not feel good, UTI, now on ABX Starting to feel better today  Labs reviewed: HBA1C 7.1 Other medications from primary care reviewed with her in detail, borderline anemic  Pimples on backside: Wonders if it is one of her medications Unable to exclude heat rash  Often times has symptoms of leg swelling abdominal bloating Only taking Lasix 20 mg in the morning Rarely takes a 40 mg On last clinic visit was taking 40 daily weightsomewhat stable  No regular exercise No recent falls, sedentary Chronic shortness of breath on exertion  Long discussion concerning her blood pressure pills Bidil is very expensive for her, $100  EKG personally reviewed by myself on todays visit Shows paced rhythm 77 bpm  Other past medical history reviewed  hospital November 2018  Echocardiogram with ejection fraction 25% Presented with chest pain chronic diastolic CHF, poor control diabetes, chronic kidney disease Climb in troponin up to 2.66 Felt to have stress cardiomyopathy ,  Cardiac cath 07/21/2017 1. Mild to moderate  nonobstructive coronary artery disease. 2. Mildly elevated left ventricular filling pressure.  1. Degree of cardiomyopathy (LVEF 25% by echo) is out of proportion to CAD. 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 Start low-dose carvedilol. 3. Medical therapy and risk factor modification to prevent progression of CAD. 4. Consider gentle diuresis tomorrow, as renal function allows. Will hold off on diuresis tonight given CKD and contrast exposure.  Pacer followed by Dr. Caryl Comes   Echocardiogram January 2017 Ejection fraction greater than 55%, No significant valve disease  Previous EGD showing gastritis, erosions . Previously had diarrhea   pacer placed in the past for sick sinus syndrome, reportedly had 10 second pause per the patient   PMH:   has a past medical history of Chronic combined systolic (congestive) and diastolic (congestive) heart failure ([place]), CKD (chronic kidney disease), stage III ([place]), COPD (chronic obstructive pulmonary disease) ([place]), Diabetes mellitus without complication ([place]), Non-ischemic cardiomyopathy ([place]), Non-obstructive CAD (coronary artery disease), and Syncope.  PSH:    Past Surgical History:  Procedure Laterality Date  . APPENDECTOMY    . ESOPHAGOGASTRODUODENOSCOPY N/A 11/27/2015   Procedure: ESOPHAGOGASTRODUODENOSCOPY (EGD);  Surgeon: Lollie Sails, MD;  Location: [facility] ENDOSCOPY;  Service: Endoscopy;  Laterality: N/A;  . LEFT HEART CATH AND CORONARY ANGIOGRAPHY N/A 07/21/2017   Procedure: LEFT HEART CATH AND CORONARY ANGIOGRAPHY;  Surgeon: [name], MD;  Location: [facility];  Service: Cardiovascular;  Laterality: N/A;  . PACEMAKER INSERTION    . TONSILLECTOMY    . TUBAL LIGATION      Current Outpatient Medications  Medication Sig Dispense Refill  . albuterol (PROVENTIL HFA;VENTOLIN HFA) 108 (90 Base) MCG/ACT inhaler Inhale 1-2 puffs into the lungs every 6 (six) hours as needed for wheezing or shortness of  breath.    Marland Kitchen aspirin 81 MG EC tablet Take 81 mg by mouth daily. Swallow whole.    Marland Kitchen atorvastatin (LIPITOR) 80 MG tablet Take 1 tablet (80 mg total) by mouth daily. 90 tablet 4  . carvedilol (COREG) 3.125 MG tablet Take 1 tablet (3.125 mg total) by mouth 2 (two) times daily with a meal. 180 tablet 4  . cefUROXime (CEFTIN) 500 MG tablet Take 500 mg by mouth 2 (two) times daily.    . cloNIDine (CATAPRES) 0.1 MG tablet Take 0.5 tablets (0.05 mg total) by mouth 2 (two) times daily. 1/2 tablet twice daily as needed for high blood perssure 60 tablet 6  . doxycycline (VIBRA-TABS) 100 MG tablet Take 1 tablet (100 mg total) by mouth 2 (two) times daily. For 5 days 10 tablet 0  . esomeprazole (NEXIUM) 40 MG capsule Take 1 capsule (40 mg total) by mouth daily. 90 capsule 3  . furosemide (LASIX) 40 MG tablet Take 1 tablet (40 mg total) by mouth daily. 90 tablet 3  . insulin aspart (NOVOLOG) 100 UNIT/ML FlexPen Inject 16 Units into the skin 2 (two) times daily.     . insulin detemir (LEVEMIR) 100 UNIT/ML injection Inject 30 Units into the skin at bedtime.     . pioglitazone (ACTOS) 45 MG tablet Take 45 mg by mouth daily.    . vitamin B-12 (CYANOCOBALAMIN) 1000 MCG tablet Take 1,000 mcg by mouth daily.    . hydrALAZINE (APRESOLINE) 25 MG tablet Take 1 tablet (25 mg total) by mouth 3 (three) times daily. 270 tablet 3  . isosorbide mononitrate (IMDUR) 30 MG 24 hr tablet Take 1 tablet (30 mg total) by mouth daily. 90 tablet 3   No current facility-administered medications for this visit.      Allergies:   Quinapril; Venlafaxine; and Zetia [ezetimibe]   Social History:  The patient  reports that she quit smoking about 12 years ago. Her smoking use included cigarettes. She smoked 2.00 packs per day. She has never used smokeless tobacco. She reports that she does not drink alcohol or use drugs.   Family History:   family history includes Esophageal cancer in her brother; Kidney cancer in her father.    Review  of Systems: Review of Systems  Constitutional: Negative.   Respiratory: Positive for shortness of breath.   Cardiovascular: Positive for leg swelling.  Gastrointestinal: Negative.   Musculoskeletal: Negative.        Gait instability  Neurological: Negative.   Psychiatric/Behavioral: Negative.   All other systems reviewed and are negative.    PHYSICAL EXAM: VS:  BP 120/70 (BP Location: Left Arm, Patient Position: Sitting, Cuff Size: Normal)   Pulse 77   Ht 5\' 3"  (1.6 m)   Wt 228 lb 4 oz (103.5 kg)   BMI 40.43 kg/m  , BMI Body mass index is 40.43 kg/m. Constitutional:  oriented to person, place, and time. No distress. Obese HENT:  Head: Normocephalic and atraumatic.  Eyes:  no discharge. No scleral icterus.  Neck: Normal range of motion. Neck supple. No JVD present.  Cardiovascular: Normal rate, regular rhythm, normal heart sounds and intact distal pulses. Exam reveals no gallop and no friction rub. Trace nonpitting lower extremity edema No murmur heard. Pulmonary/Chest: Effort normal and breath sounds normal. No stridor. No respiratory  distress.  no wheezes.  no rales.  no tenderness.  Abdominal: Soft.  no distension.  no tenderness.  Musculoskeletal: Normal range of motion.  no  tenderness or deformity.  Neurological:  normal muscle tone. Coordination normal. No atrophy Skin: Skin is warm and dry. No rash noted. not diaphoretic.  Psychiatric:  normal mood and affect. behavior is normal. Thought content normal.   Recent Labs: 07/20/2017: B Natriuretic Peptide 122.0 09/13/2017: BUN 41; Creatinine, Ser 1.94; Hemoglobin 10.7; Platelets 344; Potassium 5.2; Sodium 137    Lipid Panel No results found for: CHOL, HDL, LDLCALC, TRIG    Wt Readings from Last 3 Encounters:  04/12/18 228 lb 4 oz (103.5 kg)  10/10/17 222 lb 4 oz (100.8 kg)  09/13/17 213 lb 12 oz (97 kg)       ASSESSMENT AND PLAN:   Chronic systolic and diastolic CHF (congestive heart failure) (HCC) -   Recommend she take Lasix 40 in the mornings for leg swelling abdominal bloating For some reason she is taking 20 mg every morning For severe symptoms 40 morning 40 after lunch Recent lab work reviewed with her creatinine stable 1.6  Morbid obesity due to excess calories (HCC)  Discussed low carbohydrate diet Unable to exercise for weight loss  Shortness of breath Chronic shortness of breath, deconditioning, morbid obesity Likely mild fluid retention Recommended she increase Lasix for any symptoms  Poorly controlled type 2 diabetes mellitus ([place])  working with Dr. Doy Hutching and endocrinology Recommended low carbohydrate diet  Nonischemic cardiomyopathy Echocardiogram with ejection fraction 25%, possible stress cardiomyopathy  Likely moderately fluid up Long discussion concerning her medications, missing some doses of hydralazine also BiDil very expensive for her Recommended she take isosorbide 30 mg daily, change hydralazine to 25 mg 3 times a day Hold the clonidine and hold the BiDil  Complete heart block ([place]) Pacemaker, followed by Dr. Caryl Comes Paced rhythm today  Chronic renal failure  Creatinine stable 1.6 Avoid NSAIDs  Essential hypertension Medication changes as above   Total encounter time more than 25 minutes  Greater than 50% was spent in counseling and coordination of care with the patient   Disposition:   F/U 12 months   Orders Placed This Encounter  Procedures  . EKG 12-Lead     Signed, Esmond Plants, M.D., Ph.D. 04/12/2018  [place], [place]

## 2018-04-12 ENCOUNTER — Encounter: Payer: Self-pay | Admitting: Cardiovascular Disease

## 2018-04-12 ENCOUNTER — Ambulatory Visit (INDEPENDENT_AMBULATORY_CARE_PROVIDER_SITE_OTHER): Payer: Medicare Other | Admitting: Cardiovascular Disease

## 2018-04-12 VITALS — BP 120/70 | HR 77 | Ht 63.0 in | Wt 228.2 lb

## 2018-04-12 DIAGNOSIS — E1165 Type 2 diabetes mellitus with hyperglycemia: Secondary | ICD-10-CM

## 2018-04-12 DIAGNOSIS — N183 Chronic kidney disease, stage 3 unspecified: Secondary | ICD-10-CM

## 2018-04-12 DIAGNOSIS — I442 Atrioventricular block, complete: Secondary | ICD-10-CM

## 2018-04-12 DIAGNOSIS — R600 Localized edema: Secondary | ICD-10-CM

## 2018-04-12 DIAGNOSIS — D649 Anemia, unspecified: Secondary | ICD-10-CM

## 2018-04-12 DIAGNOSIS — I5032 Chronic diastolic (congestive) heart failure: Secondary | ICD-10-CM | POA: Diagnosis not present

## 2018-04-12 MED ORDER — CARVEDILOL 3.125 MG PO TABS: 3.1250 mg | ORAL_TABLET | Freq: Two times a day (BID) | ORAL | 4 refills | Status: DC

## 2018-04-12 MED ORDER — HYDRALAZINE HCL 25 MG PO TABS: 25.0000 mg | ORAL_TABLET | Freq: Three times a day (TID) | ORAL | 3 refills | Status: DC

## 2018-04-12 MED ORDER — FUROSEMIDE 40 MG PO TABS: 40.0000 mg | ORAL_TABLET | Freq: Every day | ORAL | 3 refills | Status: DC

## 2018-04-12 MED ORDER — ISOSORBIDE MONONITRATE ER 30 MG PO TB24: 30.0000 mg | ORAL_TABLET | Freq: Every day | ORAL | 3 refills | Status: DC

## 2018-04-12 MED ORDER — ATORVASTATIN CALCIUM 80 MG PO TABS: 80.0000 mg | ORAL_TABLET | Freq: Every day | ORAL | 4 refills | Status: AC

## 2018-04-12 NOTE — Patient Instructions (Addendum)
Medication Instructions:   When you run out of the BIDIL, Stop Start isosorbide one a day (30 mg) Start hydralazine 1 pill three times a day  Stop the clonidine Take only for high blood pressure  For significant leg swelling, ABD swelling, Take a whole pill 40 mg in the morning IF severe,  take extra lasix after lunch at 2pm  Labwork:  No new labs needed  Testing/Procedures:  No further testing at this time   Follow-Up: It was a pleasure seeing you in the office today. Please call us if you have new issues that need to be addressed before your next appt.  [phone]  Your physician wants you to follow-up in: 12 months.  You will receive a reminder letter in the mail two months in advance. If you don't receive a letter, please call our office to schedule the follow-up appointment.  If you need a refill on your cardiac medications before your next appointment, please call your pharmacy.  For educational health videos Log in to : [email or web address] Or : [email or web address], password : [email or web address]

## 2018-09-18 NOTE — Progress Notes (Signed)
Cardiology Office Note Date:  09/20/2018  Patient ID:  Teresa Mercado, Teresa Mercado 12-10-1945, MRN [id number] PCP:  Idelle Crouch, MD  Cardiologist:  Dr. Rockey Situ, MD Electrophysiologist: Dr. Caryl Comes, MD    Chief Complaint: Lower extremity swelling, shortness of breath, orthopnea, weight gain  History of Present Illness: Teresa Mercado is a 73 y.o. female with history of nonobstructive CAD by [place] in 07/2017 as outlined below, NSTEMI stress-induced cardiomyopathy, NICM, chronic diastolic CHF, complete heart block s/p PPM in 2010 followed by EP, DM2, CKD stage III with a baseline of SCr of 1.6-1.8, HTN, anemia, COPD, and GI bleed requiring pRBC transfusion who presents for approximate 4-week history of significant weight gain, shortness of breath, orthopnea, and lower extremity swelling.  Prior echo in 09/2015 showed an EF of >55% with no significant valvular disease.   In 07/2017, she was admitted to [facility] with chest pain and troponin elevation.  She was transferred to [facility] where echocardiogram showed an EF of 25% with multiple wall motion abnormalities.  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  After aggressive diuresis and 14 pound weight loss, she was discharged home. Follow up echo in 10/2017 showed an improved EF of 55-60% septal WMA secondary to BBB, Gr2DD. She was last seen in the office in 03/2018 with a weight of 228 pounds. She noted some abdominal bloating and was only taking Lasix 20 mg daily (previously taking 40 mg daily). It was recommended she take Lasix 40 mg in the morning and for severe symptoms, to take 40 mg bid. She had been missing some doses of BiDIL secondary to cost. In this setting, she was changed to Imdur/hydralazine as separate pills.  Labs: 03/2018 - HGB 10.6, K+ 4.7, SCr 1.6, LFT normal 12/2017 - LDL 70  Patient was seen by PCP on 08/13/2018 and noted to be volume overloaded with significant  lower extremity swelling, shortness of breath, and weight gain.  The patient was noted to weigh 236 pounds at that time with a baseline weight approximately 210 to 215 pounds.  Her Lasix was doubled to 40 mg twice daily.  Labs checked at that time showed a serum creatinine of 1.7 with a baseline approximately 1.5-1.6.  And follow-up with her PCP on 08/31/2018 she continued to note significant shortness of breath and lower extremity swelling.  Her weight had improved to 233 pounds.  There was a significant increase in her serum creatinine to 2.1 with a BUN of 62.  In this setting, it is unclear if the patient was told to hold her Lasix or just to decrease Lasix to 40 mg daily.  The patient indicates she was told to hold her Lasix and has not been taking Lasix since 08/31/2018 however the note indicates the patient was to take 1 Lasix daily.  Follow-up labs on 09/10/2018 showed an improved serum creatinine of 1.4.  This was not on any diuretic.  The patient comes into our office today noting significant lower extremity swelling, shortness of breath, orthopnea, and weight gain.  There has been no chest pain or palpitations.  Patient has noted an approximate 6-week history of increased lower extremity swelling and weight gain.  Over the past 1 week she has noted significant orthopnea and is now having to sleep sitting up in a recliner.  She denies any changes in her diet that would have precipitated such a drastic weight increase.  She is concerned about her breathing and is  worried she might get worse overnight.  She is tearful during our office visit.  She reports compliance with medications and a low-sodium diet.  She drinks approximately 32 to 48 ounces of water daily.   Past Medical History:  Diagnosis Date  . Chronic combined systolic (congestive) and diastolic (congestive) heart failure ([place])    a. 09/2015 Echo: normal EF 55%; b. 07/2017 Echo: EF 25%, Gr2 DD, mild lvh, mid ant/inf/infsept/apical AK. Triv  MR. mildly to mod dil LA.  . CKD (chronic kidney disease), stage III ([place])   . COPD (chronic obstructive pulmonary disease) (HCC)    Not on home o2  . Diabetes mellitus without complication ([place])    On Levemir  . Non-ischemic cardiomyopathy ([place])    a. 07/2017 Echo: EF 25%, Gr2 DD - ? takotsubo.  . Non-obstructive CAD (coronary artery disease)    a. 07/2017 Cath: LM nl, LAD 40ost, D1 50, D2 40, LCX 81m, OM1/2 nl, RCA large, nl.  . Syncope    a.  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    Past Surgical History:  Procedure Laterality Date  . APPENDECTOMY    . ESOPHAGOGASTRODUODENOSCOPY N/A 11/27/2015   Procedure: ESOPHAGOGASTRODUODENOSCOPY (EGD);  Surgeon: Lollie Sails, MD;  Location: [facility] ENDOSCOPY;  Service: Endoscopy;  Laterality: N/A;  . LEFT HEART CATH AND CORONARY ANGIOGRAPHY N/A 07/21/2017   Procedure: LEFT HEART CATH AND CORONARY ANGIOGRAPHY;  Surgeon: [name], MD;  Location: [place] CV LAB;  Service: Cardiovascular;  Laterality: N/A;  . PACEMAKER INSERTION    . TONSILLECTOMY    . TUBAL LIGATION      Current Meds  Medication Sig  . albuterol (PROVENTIL HFA;VENTOLIN HFA) 108 (90 Base) MCG/ACT inhaler Inhale 1-2 puffs into the lungs every 6 (six) hours as needed for wheezing or shortness of breath.  Marland Kitchen aspirin 81 MG EC tablet Take 81 mg by mouth daily. Swallow whole.  Marland Kitchen atorvastatin (LIPITOR) 80 MG tablet Take 1 tablet (80 mg total) by mouth daily.  . carvedilol (COREG) 3.125 MG tablet Take 1 tablet (3.125 mg total) by mouth 2 (two) times daily with a meal.  . cloNIDine (CATAPRES) 0.1 MG tablet Take 0.5 tablets (0.05 mg total) by mouth 2 (two) times daily. 1/2 tablet twice daily as needed for high blood perssure  . esomeprazole (NEXIUM) 40 MG capsule Take 1 capsule (40 mg total) by mouth daily.  . hydrALAZINE (APRESOLINE) 25 MG tablet Take 1 tablet (25 mg total) by mouth 3 (three) times daily.  . insulin aspart (NOVOLOG) 100 UNIT/ML  FlexPen Inject 16 Units into the skin 2 (two) times daily.   . insulin detemir (LEVEMIR) 100 UNIT/ML injection Inject 30 Units into the skin at bedtime.   . isosorbide mononitrate (IMDUR) 30 MG 24 hr tablet Take 1 tablet (30 mg total) by mouth daily.  . pioglitazone (ACTOS) 45 MG tablet Take 45 mg by mouth daily.  . vitamin B-12 (CYANOCOBALAMIN) 1000 MCG tablet Take 1,000 mcg by mouth daily.    Allergies:   Quinapril; Venlafaxine; and Zetia [ezetimibe]   Social History:  The patient  reports that she quit smoking about 12 years ago. Her smoking use included cigarettes. She smoked 2.00 packs per day. She has never used smokeless tobacco. She reports that she does not drink alcohol or use drugs.   Family History:  The patient's family history includes Esophageal cancer in her brother; Kidney cancer in her father.  ROS:   Review of Systems  Constitutional: Positive for malaise/fatigue. Negative for chills, diaphoresis, fever and weight loss.  HENT: Negative for congestion.   Eyes: Negative for discharge and redness.  Respiratory: Positive for cough and shortness of breath. Negative for hemoptysis, sputum production and wheezing.   Cardiovascular: Positive for orthopnea and leg swelling. Negative for chest pain, palpitations, claudication and PND.  Gastrointestinal: Negative for abdominal pain, blood in stool, heartburn, melena, nausea and vomiting.  Genitourinary: Negative for hematuria.  Musculoskeletal: Negative for falls and myalgias.  Skin: Negative for rash.  Neurological: Positive for weakness. Negative for dizziness, tingling, tremors, sensory change, speech change, focal weakness and loss of consciousness.  Endo/Heme/Allergies: Does not bruise/bleed easily.  Psychiatric/Behavioral: Negative for substance abuse. The patient is not nervous/anxious.   All other systems reviewed and are negative.    PHYSICAL EXAM:  VS:  BP (!) 142/60 (BP Location: Right Arm, Patient Position: Sitting,  Cuff Size: Large)   Pulse 81   Ht 5\' 3"  (1.6 m)   Wt 240 lb (108.9 kg)   BMI 42.51 kg/m  BMI: Body mass index is 42.51 kg/m.  Physical Exam  Constitutional: She is oriented to person, place, and time. She appears well-developed and well-nourished.  HENT:  Head: Normocephalic and atraumatic.  Eyes: Right eye exhibits no discharge. Left eye exhibits no discharge.  Neck: Normal range of motion. JVD present.  JVD elevated to the angle of the mandible  Cardiovascular: Normal rate, regular rhythm, S1 normal, S2 normal and normal heart sounds. Exam reveals no distant heart sounds, no friction rub, no midsystolic click and no opening snap.  No murmur heard. Pulses:      Posterior tibial pulses are 1+ on the right side and 1+ on the left side.  Pulmonary/Chest: Effort normal. No respiratory distress. She has decreased breath sounds. She has no wheezes. She has rales in the right lower field and the left lower field. She exhibits no tenderness.  Abdominal: Soft. She exhibits no distension. There is no abdominal tenderness.  Musculoskeletal:        General: Edema present.     Comments: 2-3+ bilateral lower extremity pitting edema  Neurological: She is alert and oriented to person, place, and time.  Skin: Skin is warm and dry. No cyanosis. Nails show no clubbing.  Psychiatric: She has a normal mood and affect. Her speech is normal and behavior is normal. Judgment and thought content normal.     EKG:  Was ordered and interpreted by me today. Shows V paced rhythm, 81 bpm  Recent Labs: No results found for requested labs within last 8760 hours.  No results found for requested labs within last 8760 hours.   CrCl cannot be calculated (Patient's most recent lab result is older than the maximum 21 days allowed.).   Wt Readings from Last 3 Encounters:  09/20/18 240 lb (108.9 kg)  04/12/18 228 lb 4 oz (103.5 kg)  10/10/17 222 lb 4 oz (100.8 kg)     Other studies reviewed: Additional  studies/records reviewed today include: summarized above  ASSESSMENT AND PLAN:  1. Acute on chronic combined CHF secondary to nonischemic cardiomyopathy and stress-induced cardiomyopathy: She is NYHA class III.  Patient appears significantly volume overloaded with a weight that is approximately 30 pounds above her baseline with associated orthopnea requiring her to sleep sitting up in a recliner.  The patient is very worried about her respiratory status hitting into this evening and is tearful during our office visit.  Patient has demonstrated difficulty in outpatient diuresis  as outlined in the HPI when she had worsening renal function by doubling her Lasix though remained significantly volume overloaded.  In this setting, I have presented her with the option of transitioning to torsemide with close outpatient follow-up versus ED evaluation with likely hospital admission for IV diuresis and close monitoring of her renal function.  She prefers ED evaluation with likely hospital admission.  In this setting, the patient was transferred to the ED for further management.  I recommend an echocardiogram be obtained during this hospitalization to further evaluate her EF and pulmonary arterial pressures.  She may benefit from transition to torsemide as an outpatient.  2. Nonobstructive CAD: No symptoms suggestive of ACS at this time.  Check echocardiogram as above.  She remains on aspirin, Lipitor, carvedilol, and isosorbide mononitrate.  3. Complete heart block: Device appears to be functioning normally.  Followed by EP.  4. CKD stage III: Renal function worsened with attempted outpatient diuresis as above.  Recommend close monitoring of renal function with diuresis as outlined above.  5. Hypertension: Blood pressure is suboptimally controlled today.  Hopefully, with diuresis her blood pressure will improve.  Disposition: Sent to the ED for further evaluation, IV Lasix, labs, and likely hospital admission.   Follow-up pending this inpatient evaluation.  Current medicines are reviewed at length with the patient today.  The patient did not have any concerns regarding medicines.  Signed, Christell Faith, PA-C 09/20/2018 3:01 PM     [place] [address] [place] [address] [place], [place] [address] [phone]

## 2018-09-20 ENCOUNTER — Encounter: Payer: Self-pay | Admitting: Physician Assistant

## 2018-09-20 ENCOUNTER — Inpatient Hospital Stay
Admission: EM | Admit: 2018-09-20 | Discharge: 2018-09-23 | DRG: 291 | Disposition: A | Discharge status: Home Health | Payer: Medicare Other | Attending: Specialist | Admitting: Specialist

## 2018-09-20 ENCOUNTER — Emergency Department: Payer: Medicare Other

## 2018-09-20 ENCOUNTER — Other Ambulatory Visit: Payer: Self-pay

## 2018-09-20 ENCOUNTER — Encounter: Payer: Self-pay | Admitting: *Deleted

## 2018-09-20 ENCOUNTER — Ambulatory Visit (INDEPENDENT_AMBULATORY_CARE_PROVIDER_SITE_OTHER): Payer: Medicare Other | Admitting: Physician Assistant

## 2018-09-20 VITALS — BP 142/60 | HR 81 | Ht 63.0 in | Wt 240.0 lb

## 2018-09-20 DIAGNOSIS — I5043 Acute on chronic combined systolic (congestive) and diastolic (congestive) heart failure: Secondary | ICD-10-CM

## 2018-09-20 DIAGNOSIS — I2510 Atherosclerotic heart disease of native coronary artery without angina pectoris: Secondary | ICD-10-CM

## 2018-09-20 DIAGNOSIS — Z950 Presence of cardiac pacemaker: Secondary | ICD-10-CM

## 2018-09-20 DIAGNOSIS — Z87891 Personal history of nicotine dependence: Secondary | ICD-10-CM

## 2018-09-20 DIAGNOSIS — E1165 Type 2 diabetes mellitus with hyperglycemia: Secondary | ICD-10-CM

## 2018-09-20 DIAGNOSIS — Z6841 Body Mass Index (BMI) 40.0 and over, adult: Secondary | ICD-10-CM

## 2018-09-20 DIAGNOSIS — R0602 Shortness of breath: Secondary | ICD-10-CM | POA: Diagnosis not present

## 2018-09-20 DIAGNOSIS — I130 Hypertensive heart and chronic kidney disease with heart failure and stage 1 through stage 4 chronic kidney disease, or unspecified chronic kidney disease: Secondary | ICD-10-CM | POA: Diagnosis not present

## 2018-09-20 DIAGNOSIS — I5033 Acute on chronic diastolic (congestive) heart failure: Secondary | ICD-10-CM | POA: Diagnosis present

## 2018-09-20 DIAGNOSIS — I10 Essential (primary) hypertension: Secondary | ICD-10-CM

## 2018-09-20 DIAGNOSIS — K219 Gastro-esophageal reflux disease without esophagitis: Secondary | ICD-10-CM | POA: Diagnosis present

## 2018-09-20 DIAGNOSIS — N183 Chronic kidney disease, stage 3 unspecified: Secondary | ICD-10-CM

## 2018-09-20 DIAGNOSIS — D638 Anemia in other chronic diseases classified elsewhere: Secondary | ICD-10-CM | POA: Diagnosis present

## 2018-09-20 DIAGNOSIS — J9601 Acute respiratory failure with hypoxia: Secondary | ICD-10-CM | POA: Diagnosis present

## 2018-09-20 DIAGNOSIS — R0902 Hypoxemia: Secondary | ICD-10-CM

## 2018-09-20 DIAGNOSIS — I428 Other cardiomyopathies: Secondary | ICD-10-CM

## 2018-09-20 DIAGNOSIS — J449 Chronic obstructive pulmonary disease, unspecified: Secondary | ICD-10-CM | POA: Diagnosis present

## 2018-09-20 DIAGNOSIS — I252 Old myocardial infarction: Secondary | ICD-10-CM

## 2018-09-20 DIAGNOSIS — I5181 Takotsubo syndrome: Secondary | ICD-10-CM

## 2018-09-20 DIAGNOSIS — I442 Atrioventricular block, complete: Secondary | ICD-10-CM

## 2018-09-20 DIAGNOSIS — Z7982 Long term (current) use of aspirin: Secondary | ICD-10-CM

## 2018-09-20 DIAGNOSIS — Z794 Long term (current) use of insulin: Secondary | ICD-10-CM

## 2018-09-20 DIAGNOSIS — E875 Hyperkalemia: Secondary | ICD-10-CM | POA: Diagnosis present

## 2018-09-20 DIAGNOSIS — Z79899 Other long term (current) drug therapy: Secondary | ICD-10-CM

## 2018-09-20 DIAGNOSIS — I509 Heart failure, unspecified: Secondary | ICD-10-CM

## 2018-09-20 DIAGNOSIS — E1122 Type 2 diabetes mellitus with diabetic chronic kidney disease: Secondary | ICD-10-CM | POA: Diagnosis present

## 2018-09-20 DIAGNOSIS — R252 Cramp and spasm: Secondary | ICD-10-CM | POA: Diagnosis present

## 2018-09-20 DIAGNOSIS — E785 Hyperlipidemia, unspecified: Secondary | ICD-10-CM | POA: Diagnosis present

## 2018-09-20 DIAGNOSIS — Z9981 Dependence on supplemental oxygen: Secondary | ICD-10-CM

## 2018-09-20 LAB — BASIC METABOLIC PANEL
Anion gap: 5 (ref 5–15)
BUN: 27 mg/dL — ABNORMAL HIGH (ref 8–23)
CHLORIDE: 104 mmol/L (ref 98–111)
CO2: 28 mmol/L (ref 22–32)
Calcium: 9.2 mg/dL (ref 8.9–10.3)
Creatinine, Ser: 1.42 mg/dL — ABNORMAL HIGH (ref 0.44–1.00)
GFR calc Af Amer: 42 mL/min — ABNORMAL LOW (ref >60)
GFR calc non Af Amer: 37 mL/min — ABNORMAL LOW (ref >60)
Glucose, Bld: 131 mg/dL — ABNORMAL HIGH (ref 70–99)
Potassium: 5.2 mmol/L — ABNORMAL HIGH (ref 3.5–5.1)
Sodium: 137 mmol/L (ref 135–145)

## 2018-09-20 LAB — CBC
HCT: 33.3 % — ABNORMAL LOW (ref 36.0–46.0)
HEMOGLOBIN: 10.2 g/dL — AB (ref 12.0–15.0)
MCH: 28.7 pg (ref 26.0–34.0)
MCHC: 30.6 g/dL (ref 30.0–36.0)
MCV: 93.5 fL (ref 80.0–100.0)
Platelets: 273 10*3/uL (ref 150–400)
RBC: 3.56 MIL/uL — ABNORMAL LOW (ref 3.87–5.11)
RDW: 14.5 % (ref 11.5–15.5)
WBC: 7.4 10*3/uL (ref 4.0–10.5)
nRBC: 0 % (ref 0.0–0.2)

## 2018-09-20 LAB — TROPONIN I: Troponin I: <0.03 ng/mL (ref <0.03)

## 2018-09-20 MED ORDER — FUROSEMIDE 10 MG/ML IJ SOLN
60.0000 mg | Freq: Once | INTRAMUSCULAR | Status: AC
  Administered 2018-09-20: 60 mg via INTRAVENOUS
  Filled 2018-09-20: qty 8

## 2018-09-20 NOTE — ED Provider Notes (Signed)
[facility] Emergency Department Provider Note    ____________________________________________   I have reviewed the triage vital signs and the nursing notes.   HISTORY  Chief Complaint Shortness of Breath   History limited by: Not Limited   HPI Teresa Mercado is a 73 y.o. female who presents to the emergency department today from cardiology office because of concerns for CHF exacerbation.  Patient states for the past month she has been having increasing shortness of breath as well as lower extremity swelling.  She states that her doctor told her to stop taking her Lasix because of concerns for kidney injury a few weeks ago.  She was also told to drink more fluids so that is what she has been doing.  She states it is now hard for her to lie flat at night.  She is on oxygen at night.  The patient denies any fevers or chest pain.  Patient denies any pain in her legs.  Per medical record review patient has a history of cardiology visit earlier today.  Past Medical History:  Diagnosis Date  . Chronic combined systolic (congestive) and diastolic (congestive) heart failure ([place])    a. 09/2015 Echo: normal EF 55%; b. 07/2017 Echo: EF 25%, Gr2 DD, mild lvh, mid ant/inf/infsept/apical AK. Triv MR. mildly to mod dil LA.  . CKD (chronic kidney disease), stage III ([place])   . COPD (chronic obstructive pulmonary disease) (HCC)    Not on home o2  . Diabetes mellitus without complication ([place])    On Levemir  . Non-ischemic cardiomyopathy ([place])    a. 07/2017 Echo: EF 25%, Gr2 DD - ? takotsubo.  . Non-obstructive CAD (coronary artery disease)    a. 07/2017 Cath: LM nl, LAD 40ost, D1 50, D2 40, LCX 77m, OM1/2 nl, RCA large, nl.  . Syncope    a.  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    Patient Active Problem List   Diagnosis Date Noted  . Chest pain 07/20/2017  . CKD (chronic kidney disease), stage III ([place]) 07/20/2017  . Poorly controlled type 2  diabetes mellitus ([place]) 08/05/2016  . 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 Complete heart block ([place]) 08/05/2016  . Chronic diastolic CHF (congestive heart failure) ([place]) 12/08/2015  . Morbid obesity due to excess calories ([place]) 12/08/2015  . Pacemaker 12/08/2015  . Shortness of breath 12/08/2015  . Bilateral edema of lower extremity 12/08/2015  . Iron deficiency anemia due to chronic blood loss 12/08/2015  . Anemia 11/25/2015    Past Surgical History:  Procedure Laterality Date  . APPENDECTOMY    . ESOPHAGOGASTRODUODENOSCOPY N/A 11/27/2015   Procedure: ESOPHAGOGASTRODUODENOSCOPY (EGD);  Surgeon: Lollie Sails, MD;  [facility]: [facility] ENDOSCOPY;  Service: Endoscopy;  Laterality: N/A;  . LEFT HEART CATH AND CORONARY ANGIOGRAPHY N/A 07/21/2017   Procedure: LEFT HEART CATH AND CORONARY ANGIOGRAPHY;  Surgeon: [name], MD;  Location: [place] CV LAB;  Service: Cardiovascular;  Laterality: N/A;  . PACEMAKER INSERTION    . TONSILLECTOMY    . TUBAL LIGATION      Prior to Admission medications   Medication Sig Start Date End Date Taking? Authorizing Provider  albuterol (PROVENTIL HFA;VENTOLIN HFA) 108 (90 Base) MCG/ACT inhaler Inhale 1-2 puffs into the lungs every 6 (six) hours as needed for wheezing or shortness of breath.    [provider]  aspirin 81 MG EC tablet Take 81 mg by mouth daily. Swallow whole.  [provider]  atorvastatin (LIPITOR) 80 MG tablet Take 1 tablet (80 mg total) by mouth daily. 04/12/18   Minna Merritts, MD  carvedilol (COREG) 3.125 MG tablet Take 1 tablet (3.125 mg total) by mouth 2 (two) times daily with a meal. 04/12/18   Gollan, Kathlene November, MD  cloNIDine (CATAPRES) 0.1 MG tablet Take 0.5 tablets (0.05 mg total) by mouth 2 (two) times daily. 1/2 tablet twice daily as needed for high blood perssure 04/12/18   Minna Merritts, MD  esomeprazole (NEXIUM) 40 MG capsule Take 1 capsule (40 mg  total) by mouth daily. 05/25/17   Minna Merritts, MD  hydrALAZINE (APRESOLINE) 25 MG tablet Take 1 tablet (25 mg total) by mouth 3 (three) times daily. 04/12/18   Minna Merritts, MD  insulin aspart (NOVOLOG) 100 UNIT/ML FlexPen Inject 16 Units into the skin 2 (two) times daily.     [provider]  insulin detemir (LEVEMIR) 100 UNIT/ML injection Inject 30 Units into the skin at bedtime.     [provider]  isosorbide mononitrate (IMDUR) 30 MG 24 hr tablet Take 1 tablet (30 mg total) by mouth daily. 04/12/18   Minna Merritts, MD  pioglitazone (ACTOS) 45 MG tablet Take 45 mg by mouth daily.    [provider]  vitamin B-12 (CYANOCOBALAMIN) 1000 MCG tablet Take 1,000 mcg by mouth daily.    [provider]    Allergies Quinapril; Venlafaxine; and Zetia [ezetimibe]  Family History  Problem Relation Age of Onset  . Kidney cancer Father   . Esophageal cancer Brother     Social History Social History   Tobacco Use  . Smoking status: Former Smoker    Packs/day: 2.00    Types: Cigarettes    Last attempt to quit: 11/24/2005    Years since quitting: 12.8  . Smokeless tobacco: Never Used  . Tobacco comment: Quit 10 years ago, but has 45 years smoking prior to that  Substance Use Topics  . Alcohol use: No    Alcohol/week: 0.0 standard drinks  . Drug use: No    Review of Systems Constitutional: No fever/chills Eyes: No visual changes. ENT: No sore throat. Cardiovascular: Denies chest pain. Respiratory: Positive for shortness of breath. Gastrointestinal: No abdominal pain.  No nausea, no vomiting.  No diarrhea.   Genitourinary: Negative for dysuria. Musculoskeletal: Positive for bilateral lower extremity swelling.  Skin: Negative for rash. Neurological: Negative for headaches, focal weakness or numbness.  ____________________________________________   PHYSICAL EXAM:  VITAL SIGNS: ED Triage Vitals  Enc Vitals Group     BP 09/20/18 1611 (!)  164/74     Pulse Rate 09/20/18 1608 88     Resp 09/20/18 1822 (!) 22     Temp 09/20/18 1608 97.8 F (36.6 C)     Temp Source 09/20/18 1608 Oral     SpO2 09/20/18 1608 92 %     Weight --      Height --      Head Circumference --      Peak Flow --      Pain Score 09/20/18 1611 0   Constitutional: Alert and oriented.  Eyes: Conjunctivae are normal.  ENT      Head: Normocephalic and atraumatic.      Nose: No congestion/rhinnorhea.      Mouth/Throat: Mucous membranes are moist.      Neck: No stridor. Hematological/Lymphatic/Immunilogical: No cervical lymphadenopathy. Cardiovascular: Normal rate, regular rhythm.  No murmurs, rubs, or gallops.  Respiratory: Increased respiratory effort.  Gastrointestinal: Soft and non tender. No rebound. No guarding.  Genitourinary: Deferred Musculoskeletal: Normal range of motion in all extremities. 2+ bilateral pitting edema.  Neurologic:  Normal speech and language. No gross focal neurologic deficits are appreciated.  Skin:  Skin is warm, dry and intact. No rash noted. Psychiatric: Mood and affect are normal. Speech and behavior are normal. Patient exhibits appropriate insight and judgment.  ____________________________________________    LABS (pertinent positives/negatives)  BMp na 137, k 5.2, glu 131, cr 1.42 CBC wbc 7.4, hgb 10.2, plt 273 Trop <0.03  ____________________________________________   EKG  I, Nance Pear, attending physician, personally viewed and interpreted this EKG  EKG Time: 1614 Rate: 89 Rhythm: atrial sensed v paced rhythm Axis: left axis deviation Intervals: qtc 515 QRS: wide ST changes: no st elevation equivalent Impression: abnormal ekg   ____________________________________________    RADIOLOGY  CXR CHF with edema  ____________________________________________   PROCEDURES  Procedures  ____________________________________________   INITIAL IMPRESSION / ASSESSMENT AND PLAN / ED  COURSE  Pertinent labs & imaging results that were available during my care of the patient were reviewed by me and considered in my medical decision making (see chart for details).   Patient presented to the emergency department today because of concerns for difficulty with breathing and swelling.  Patient was sent from cardiology office.  Does have a history of CHF.  Patient does appear quite fluid overloaded on physical exam.  Additionally patient had edema on chest x-ray.  Patient was given IV Lasix with good urinary output however upon attempting to ambulate patient's oxygen dropped into the mid 80s.  Will plan on admission.  Discussed findings and plan with patient family.   ____________________________________________   FINAL CLINICAL IMPRESSION(S) / ED DIAGNOSES  Final diagnoses:  Congestive heart failure, unspecified HF chronicity, unspecified heart failure type ([place])  Hypoxia  SOB (shortness of breath)     Note: This dictation was prepared with Dragon dictation. Any transcriptional errors that result from this process are unintentional     Nance Pear, MD 09/21/18 1705

## 2018-09-20 NOTE — ED Notes (Addendum)
Attempted to walk patient with pulse oximetry per MD Archie Balboa request. Patient's oxygen saturation level decreased to 83% when repositioning to a sitting position, and her work of breathing increased significantly. MD Archie Balboa informed.   Patient placed on 2L [place]. RN will continue to monitor.

## 2018-09-20 NOTE — ED Triage Notes (Signed)
Pt to triage via wheelchair.  Pt reports sob.  No  Chest pain.  Pt on 2l liters oxygen at night time.  Pt sent from dr gollan's office today for eval.  Pt alert.

## 2018-09-20 NOTE — ED Notes (Signed)
First Nurse Note: pt here from [facility] with +30lb weight gain in unknown amount of time per heart clinic RN. Pt appears in NAD.

## 2018-09-20 NOTE — ED Notes (Signed)
ED Provider at bedside. 

## 2018-09-20 NOTE — Patient Instructions (Signed)
Medication Instructions:  - no changes  If you need a refill on your cardiac medications before your next appointment, please call your pharmacy.   Lab work: - none ordered  If you have labs (blood work) drawn today and your tests are completely normal, you will receive your results only by: Marland Kitchen MyChart Message (if you have MyChart) OR . A paper copy in the mail If you have any lab test that is abnormal or we need to change your treatment, we will call you to review the results.  Testing/Procedures: - no ordered  Follow-Up: At [facility], you and your health needs are our priority.  As part of our continuing mission to provide you with exceptional heart care, we have created designated Provider Care Teams.  These Care Teams include your primary Cardiologist (physician) and Advanced Practice Providers (APPs -  Physician Assistants and Nurse Practitioners) who all work together to provide you with the care you need, when you need it. . pending ER evaluation  Any Other Special Instructions Will Be Listed Below (If Applicable). - You are being taken to the [facility] ER today for further evaluation of your shortness of breath/ weight gain

## 2018-09-21 ENCOUNTER — Encounter: Payer: Self-pay | Admitting: Internal Medicine

## 2018-09-21 ENCOUNTER — Other Ambulatory Visit: Payer: Self-pay

## 2018-09-21 DIAGNOSIS — Z9981 Dependence on supplemental oxygen: Secondary | ICD-10-CM | POA: Diagnosis not present

## 2018-09-21 DIAGNOSIS — N183 Chronic kidney disease, stage 3 (moderate): Secondary | ICD-10-CM | POA: Diagnosis present

## 2018-09-21 DIAGNOSIS — R252 Cramp and spasm: Secondary | ICD-10-CM | POA: Diagnosis present

## 2018-09-21 DIAGNOSIS — I130 Hypertensive heart and chronic kidney disease with heart failure and stage 1 through stage 4 chronic kidney disease, or unspecified chronic kidney disease: Secondary | ICD-10-CM | POA: Diagnosis present

## 2018-09-21 DIAGNOSIS — I442 Atrioventricular block, complete: Secondary | ICD-10-CM | POA: Diagnosis present

## 2018-09-21 DIAGNOSIS — D638 Anemia in other chronic diseases classified elsewhere: Secondary | ICD-10-CM | POA: Diagnosis present

## 2018-09-21 DIAGNOSIS — K219 Gastro-esophageal reflux disease without esophagitis: Secondary | ICD-10-CM | POA: Diagnosis present

## 2018-09-21 DIAGNOSIS — R0602 Shortness of breath: Secondary | ICD-10-CM | POA: Diagnosis present

## 2018-09-21 DIAGNOSIS — Z7982 Long term (current) use of aspirin: Secondary | ICD-10-CM | POA: Diagnosis not present

## 2018-09-21 DIAGNOSIS — I110 Hypertensive heart disease with heart failure: Secondary | ICD-10-CM | POA: Diagnosis not present

## 2018-09-21 DIAGNOSIS — Z87891 Personal history of nicotine dependence: Secondary | ICD-10-CM | POA: Diagnosis not present

## 2018-09-21 DIAGNOSIS — N184 Chronic kidney disease, stage 4 (severe): Secondary | ICD-10-CM | POA: Diagnosis not present

## 2018-09-21 DIAGNOSIS — I5033 Acute on chronic diastolic (congestive) heart failure: Secondary | ICD-10-CM | POA: Diagnosis present

## 2018-09-21 DIAGNOSIS — I509 Heart failure, unspecified: Secondary | ICD-10-CM

## 2018-09-21 DIAGNOSIS — I5043 Acute on chronic combined systolic (congestive) and diastolic (congestive) heart failure: Secondary | ICD-10-CM

## 2018-09-21 DIAGNOSIS — E875 Hyperkalemia: Secondary | ICD-10-CM | POA: Diagnosis present

## 2018-09-21 DIAGNOSIS — E1165 Type 2 diabetes mellitus with hyperglycemia: Secondary | ICD-10-CM | POA: Diagnosis present

## 2018-09-21 DIAGNOSIS — I5181 Takotsubo syndrome: Secondary | ICD-10-CM | POA: Diagnosis present

## 2018-09-21 DIAGNOSIS — E785 Hyperlipidemia, unspecified: Secondary | ICD-10-CM | POA: Diagnosis present

## 2018-09-21 DIAGNOSIS — J449 Chronic obstructive pulmonary disease, unspecified: Secondary | ICD-10-CM | POA: Diagnosis present

## 2018-09-21 DIAGNOSIS — Z794 Long term (current) use of insulin: Secondary | ICD-10-CM | POA: Diagnosis not present

## 2018-09-21 DIAGNOSIS — J9601 Acute respiratory failure with hypoxia: Secondary | ICD-10-CM | POA: Diagnosis present

## 2018-09-21 DIAGNOSIS — E1122 Type 2 diabetes mellitus with diabetic chronic kidney disease: Secondary | ICD-10-CM | POA: Diagnosis present

## 2018-09-21 DIAGNOSIS — I2510 Atherosclerotic heart disease of native coronary artery without angina pectoris: Secondary | ICD-10-CM | POA: Diagnosis present

## 2018-09-21 DIAGNOSIS — Z79899 Other long term (current) drug therapy: Secondary | ICD-10-CM | POA: Diagnosis not present

## 2018-09-21 DIAGNOSIS — Z950 Presence of cardiac pacemaker: Secondary | ICD-10-CM | POA: Diagnosis not present

## 2018-09-21 DIAGNOSIS — Z6841 Body Mass Index (BMI) 40.0 and over, adult: Secondary | ICD-10-CM | POA: Diagnosis not present

## 2018-09-21 LAB — GLUCOSE, CAPILLARY
GLUCOSE-CAPILLARY: 184 mg/dL — AB (ref 70–99)
Glucose-Capillary: 123 mg/dL — ABNORMAL HIGH (ref 70–99)
Glucose-Capillary: 169 mg/dL — ABNORMAL HIGH (ref 70–99)
Glucose-Capillary: 263 mg/dL — ABNORMAL HIGH (ref 70–99)

## 2018-09-21 LAB — TROPONIN I
Troponin I: 0.05 ng/mL (ref <0.03)
Troponin I: 0.06 ng/mL (ref <0.03)
Troponin I: <0.03 ng/mL (ref <0.03)

## 2018-09-21 LAB — PHOSPHORUS: Phosphorus: 3.6 mg/dL (ref 2.5–4.6)

## 2018-09-21 LAB — HEPATIC FUNCTION PANEL
ALT: 32 U/L (ref 0–44)
AST: 24 U/L (ref 15–41)
Albumin: 3.3 g/dL — ABNORMAL LOW (ref 3.5–5.0)
Alkaline Phosphatase: 53 U/L (ref 38–126)
Bilirubin, Direct: 0.1 mg/dL (ref 0.0–0.2)
Indirect Bilirubin: 0.7 mg/dL (ref 0.3–0.9)
Total Bilirubin: 0.8 mg/dL (ref 0.3–1.2)
Total Protein: 6.8 g/dL (ref 6.5–8.1)

## 2018-09-21 LAB — MAGNESIUM: Magnesium: 1.3 mg/dL — ABNORMAL LOW (ref 1.7–2.4)

## 2018-09-21 LAB — BRAIN NATRIURETIC PEPTIDE: B Natriuretic Peptide: 326 pg/mL — ABNORMAL HIGH (ref 0.0–100.0)

## 2018-09-21 MED ORDER — FERROUS SULFATE 325 (65 FE) MG PO TABS
325.0000 mg | ORAL_TABLET | Freq: Every day | ORAL | Status: DC
  Administered 2018-09-21 – 2018-09-23 (×3): 325 mg via ORAL
  Filled 2018-09-21 (×3): qty 1

## 2018-09-21 MED ORDER — INSULIN ASPART 100 UNIT/ML ~~LOC~~ SOLN
0.0000 [IU] | Freq: Three times a day (TID) | SUBCUTANEOUS | Status: DC
  Administered 2018-09-21: 8 [IU] via SUBCUTANEOUS
  Administered 2018-09-21: 3 [IU] via SUBCUTANEOUS
  Administered 2018-09-21: 2 [IU] via SUBCUTANEOUS
  Administered 2018-09-22: 3 [IU] via SUBCUTANEOUS
  Filled 2018-09-21 (×4): qty 1

## 2018-09-21 MED ORDER — SODIUM CHLORIDE 0.9% FLUSH
3.0000 mL | Freq: Two times a day (BID) | INTRAVENOUS | Status: DC
  Administered 2018-09-21 – 2018-09-22 (×4): 3 mL via INTRAVENOUS

## 2018-09-21 MED ORDER — BISACODYL 5 MG PO TBEC
5.0000 mg | DELAYED_RELEASE_TABLET | Freq: Every day | ORAL | Status: DC | PRN
  Filled 2018-09-21: qty 1

## 2018-09-21 MED ORDER — INFLUENZA VAC SPLIT HIGH-DOSE 0.5 ML IM SUSY
0.5000 mL | PREFILLED_SYRINGE | INTRAMUSCULAR | Status: DC
  Filled 2018-09-21: qty 0.5

## 2018-09-21 MED ORDER — ATORVASTATIN CALCIUM 20 MG PO TABS
80.0000 mg | ORAL_TABLET | Freq: Every day | ORAL | Status: DC
  Administered 2018-09-21 – 2018-09-23 (×3): 80 mg via ORAL
  Filled 2018-09-21 (×3): qty 4

## 2018-09-21 MED ORDER — TORSEMIDE 20 MG PO TABS: 20.0000 mg | ORAL_TABLET | Freq: Two times a day (BID) | ORAL | Status: DC

## 2018-09-21 MED ORDER — SODIUM CHLORIDE 0.9 % IV SOLN: 250.0000 mL | INTRAVENOUS | Status: DC | PRN

## 2018-09-21 MED ORDER — ALBUTEROL SULFATE (2.5 MG/3ML) 0.083% IN NEBU: 2.5000 mg | INHALATION_SOLUTION | Freq: Four times a day (QID) | RESPIRATORY_TRACT | Status: DC | PRN

## 2018-09-21 MED ORDER — CARVEDILOL 3.125 MG PO TABS
3.1250 mg | ORAL_TABLET | Freq: Two times a day (BID) | ORAL | Status: DC
  Administered 2018-09-21 – 2018-09-23 (×3): 3.125 mg via ORAL
  Filled 2018-09-21 (×3): qty 1

## 2018-09-21 MED ORDER — INSULIN ASPART 100 UNIT/ML ~~LOC~~ SOLN: 0.0000 [IU] | Freq: Every day | SUBCUTANEOUS | Status: DC

## 2018-09-21 MED ORDER — SODIUM CHLORIDE 0.9% FLUSH: 3.0000 mL | INTRAVENOUS | Status: DC | PRN

## 2018-09-21 MED ORDER — IPRATROPIUM-ALBUTEROL 0.5-2.5 (3) MG/3ML IN SOLN
3.0000 mL | Freq: Once | RESPIRATORY_TRACT | Status: AC
  Administered 2018-09-21: 3 mL via RESPIRATORY_TRACT

## 2018-09-21 MED ORDER — ENOXAPARIN SODIUM 40 MG/0.4ML ~~LOC~~ SOLN
40.0000 mg | SUBCUTANEOUS | Status: DC
  Administered 2018-09-22 – 2018-09-23 (×2): 40 mg via SUBCUTANEOUS
  Filled 2018-09-21 (×2): qty 0.4

## 2018-09-21 MED ORDER — ACETAMINOPHEN 325 MG PO TABS: 650.0000 mg | ORAL_TABLET | ORAL | Status: DC | PRN

## 2018-09-21 MED ORDER — MAGNESIUM SULFATE 4 GM/100ML IV SOLN
4.0000 g | Freq: Once | INTRAVENOUS | Status: AC
  Administered 2018-09-21: 4 g via INTRAVENOUS
  Filled 2018-09-21: qty 100

## 2018-09-21 MED ORDER — FUROSEMIDE 10 MG/ML IJ SOLN
60.0000 mg | Freq: Two times a day (BID) | INTRAMUSCULAR | Status: AC
  Administered 2018-09-21 – 2018-09-22 (×4): 60 mg via INTRAVENOUS
  Filled 2018-09-21: qty 8
  Filled 2018-09-21 (×3): qty 6

## 2018-09-21 MED ORDER — HYDRALAZINE HCL 25 MG PO TABS
25.0000 mg | ORAL_TABLET | Freq: Three times a day (TID) | ORAL | Status: DC
  Administered 2018-09-21 – 2018-09-23 (×3): 25 mg via ORAL
  Filled 2018-09-21 (×4): qty 1

## 2018-09-21 MED ORDER — CYCLOBENZAPRINE HCL 10 MG PO TABS
5.0000 mg | ORAL_TABLET | Freq: Once | ORAL | Status: AC
  Administered 2018-09-21: 5 mg via ORAL

## 2018-09-21 MED ORDER — CLONIDINE HCL 0.1 MG PO TABS
0.0500 mg | ORAL_TABLET | Freq: Two times a day (BID) | ORAL | Status: DC
  Administered 2018-09-21 – 2018-09-23 (×3): 0.05 mg via ORAL
  Filled 2018-09-21 (×4): qty 1

## 2018-09-21 MED ORDER — IPRATROPIUM-ALBUTEROL 0.5-2.5 (3) MG/3ML IN SOLN
RESPIRATORY_TRACT | Status: AC
  Filled 2018-09-21: qty 3

## 2018-09-21 MED ORDER — INSULIN DETEMIR 100 UNIT/ML ~~LOC~~ SOLN
20.0000 [IU] | Freq: Every day | SUBCUTANEOUS | Status: DC
  Administered 2018-09-21 – 2018-09-22 (×2): 20 [IU] via SUBCUTANEOUS
  Filled 2018-09-21 (×3): qty 0.2

## 2018-09-21 MED ORDER — ASPIRIN 81 MG PO CHEW
81.0000 mg | CHEWABLE_TABLET | Freq: Every day | ORAL | Status: DC
  Administered 2018-09-21 – 2018-09-23 (×3): 81 mg via ORAL
  Filled 2018-09-21 (×3): qty 1

## 2018-09-21 MED ORDER — SENNOSIDES-DOCUSATE SODIUM 8.6-50 MG PO TABS: 1.0000 | ORAL_TABLET | Freq: Every evening | ORAL | Status: DC | PRN

## 2018-09-21 MED ORDER — PANTOPRAZOLE SODIUM 40 MG PO TBEC
40.0000 mg | DELAYED_RELEASE_TABLET | Freq: Every day | ORAL | Status: DC
  Administered 2018-09-21 – 2018-09-23 (×3): 40 mg via ORAL
  Filled 2018-09-21 (×3): qty 1

## 2018-09-21 MED ORDER — CYCLOBENZAPRINE HCL 10 MG PO TABS
5.0000 mg | ORAL_TABLET | Freq: Three times a day (TID) | ORAL | Status: DC | PRN
  Filled 2018-09-21: qty 1

## 2018-09-21 MED ORDER — ONDANSETRON HCL 4 MG/2ML IJ SOLN: 4.0000 mg | Freq: Four times a day (QID) | INTRAMUSCULAR | Status: DC | PRN

## 2018-09-21 MED ORDER — ISOSORBIDE MONONITRATE ER 30 MG PO TB24
30.0000 mg | ORAL_TABLET | Freq: Every day | ORAL | Status: DC
  Administered 2018-09-21 – 2018-09-23 (×3): 30 mg via ORAL
  Filled 2018-09-21 (×3): qty 1

## 2018-09-21 NOTE — Consult Note (Signed)
Cardiology Consultation:   Patient ID: Teresa Mercado MRN: [id number]; DOB: 10-04-1945  Admit date: 09/20/2018 Date of Consult: 09/21/2018  Primary Care Provider: Idelle Crouch, MD Primary Cardiologist: Ida Rogue, MD  Primary Electrophysiologist:   Dr. Caryl Comes   Patient Profile:   Teresa Mercado is a 73 y.o. female with a hx of nonobstructive CAD by LHC in 07/2017 as outlined below, NSTEMI stress-induced cardiomyopathy, NICM, chronic diastolic CHF, complete heart block s/p PPM (medtronic) in 2010 followed by EP/ Dr. Caryl Comes, DM2, CKD stage III with a baseline of SCr of 1.6-1.8, HTN, anemia, COPD, and GI bleed requiring pRBC transfusion who presents for approximate 4-week history of significant weight gain, shortness of breath, orthopnea, and lower extremity swelling who is being seen today for the evaluation of acute on chronic diastolic CHF at the request of Dr. Jodell Cipro.  History of Present Illness:   Teresa Mercado is a 73 y.o. female with history of nonobstructive CAD by LHC in 07/2017 as outlined below, NSTEMI stress-induced cardiomyopathy, NICM, chronic diastolic CHF, complete heart block s/p PPM in 2010 followed by EP, DM2, CKD stage III with a baseline of SCr of 1.6-1.8, HTN, anemia, COPD, and GI bleed requiring pRBC transfusion who presents for approximate 4-week history of significant weight gain, shortness of breath, orthopnea, and lower extremity swelling.  Prior echo in 09/2015 showed an EF of >55% with no significant valvular disease.   In 07/2017, she was admitted to [facility] with chest pain and troponin elevation. She was transferred to [facility] where echocardiogram showed an EF of 25% with multiple wall motion abnormalities. 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 After aggressive diuresis and 14 pound weight loss, she was discharged home. Follow up echo in 10/2017 showed an improved EF of 55-60%  septal WMA secondary to BBB, Gr2DD. She was last seen in the office in 03/2018 with a weight of 228 pounds. She noted some abdominal bloating and was only taking Lasix 20 mg daily (previously taking 40 mg daily). It was recommended she take Lasix 40 mg in the morning and for severe symptoms, to take 40 mg bid. She had been missing some doses of BiDIL secondary to cost. In this setting, she was changed to Imdur/hydralazine as separate pills. Pacer check 07/2017. Labs: 03/2018 - HGB 10.6, K+ 4.7, SCr 1.6, LFT normal 12/2017 - LDL 70   Patient was seen by PCP on 08/13/2018 and noted to be volume overloaded with significant lower extremity swelling, shortness of breath, and weight gain.  The patient was noted to weigh 236 pounds at that time with a baseline weight approximately 210 to 215 pounds.  Her Lasix was doubled to 40 mg twice daily.  Labs checked at that time showed a serum creatinine of 1.7 with a baseline approximately 1.5-1.6.  And follow-up with her PCP on 08/31/2018 she continued to note significant shortness of breath and lower extremity swelling.  Her weight had improved to 233 pounds.  There was a significant increase in her serum creatinine to 2.1 with a BUN of 62.  In this setting, it is unclear if the patient was told to hold her Lasix or just to decrease Lasix to 40 mg daily.    The patient indicates she was told to stop/hold her Lasix and has not been taking Lasix since 08/31/2018 however the note indicates the patient was to take 1 Lasix daily.  She also reported that he told her to drink a  lot more water. Follow-up labs on 09/10/2018 showed an improved serum creatinine of 1.4. This was not on any diuretic.  The patient saw [facility] 09/20/2018 noting significant lower extremity swelling, shortness of breath, orthopnea, and weight gain.  She denied chest pain or palpitations. She noted an approximate 6-week history of increased lower extremity swelling and weight gain.  Over the past 1  week she noted significant orthopnea and was having to sleep sitting up in a recliner.  She denied any changes in her diet that would have precipitated such a drastic weight increase.  She was concerned about her breathing and was worried she might get worse overnight.  She reported compliance with medications and a low-sodium diet, as well as drinking 32 to 48 ounces of water daily. As above, she previously had worsening renal function after doubling her lasix and remained significantly volume overloaded. Therefore, she was presented with the option of outpatient diuresis with torsemide versus ED evaluation with likely hospital admission for IV diuresis and close monitoring of her renal function.   The patient decided to present to the ED from the office for further evaluation with recommendation for IV lasix, labs, and likely hospital admission. PTA medications reviewed with no changes made before admission.   In the ED Vitals:HR 88, RR 22, BP 164/74, SpO2 92%, T 97.63F Labs:Na 137, K 5.2, Cr 1.42, BUN 27, BNP 326.0, troponin negative, WBC 7.4, RBC 3.56, Hgb 10.2, Hct 33.3, plts 273 EKG: atrial sensed, v paced, LAD 89bpm CXR:CHF with mild interstitial edema which has worsened since the previous study. No alveolar pneumonia nor large pleural effusion. Thoracic aortic atherosclerosis.   Past Medical History:  Diagnosis Date  . Chronic combined systolic (congestive) and diastolic (congestive) heart failure ([place])    a. 09/2015 Echo: normal EF 55%; b. 07/2017 Echo: EF 25%, Gr2 DD, mild lvh, mid ant/inf/infsept/apical AK. Triv MR. mildly to mod dil LA.  . CKD (chronic kidney disease), stage III ([place])   . COPD (chronic obstructive pulmonary disease) ([facility])    Not on home o2  . Diabetes mellitus without complication ([place])    On Levemir  . Non-ischemic cardiomyopathy ([place])    a. 07/2017 Echo: EF 25%, Gr2 DD - ? takotsubo.  . Non-obstructive CAD (coronary artery disease)    a. 07/2017 Cath: LM nl, LAD  40ost, D1 50, D2 40, LCX 52m, OM1/2 nl, RCA large, nl.  . Syncope    a.  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    Past Surgical History:  Procedure Laterality Date  . APPENDECTOMY    . ESOPHAGOGASTRODUODENOSCOPY N/A 11/27/2015   Procedure: ESOPHAGOGASTRODUODENOSCOPY (EGD);  Surgeon: Lollie Sails, MD;  Location: [facility] ENDOSCOPY;  Service: Endoscopy;  Laterality: N/A;  . LEFT HEART CATH AND CORONARY ANGIOGRAPHY N/A 07/21/2017   Procedure: LEFT HEART CATH AND CORONARY ANGIOGRAPHY;  Surgeon: [name], MD;  Location: [place] CV LAB;  Service: Cardiovascular;  Laterality: N/A;  . PACEMAKER INSERTION    . TONSILLECTOMY    . TUBAL LIGATION       Home Medications:  Prior to Admission medications   Medication Sig Start Date End Date Taking? Authorizing Provider  albuterol (PROVENTIL HFA;VENTOLIN HFA) 108 (90 Base) MCG/ACT inhaler Inhale 1-2 puffs into the lungs every 6 (six) hours as needed for wheezing or shortness of breath.   Yes [provider]  aspirin 81 MG EC tablet Take 81 mg by mouth daily. Swallow whole.   Yes [provider]  atorvastatin (LIPITOR) 80 MG tablet Take 1 tablet (80 mg total) by mouth daily. 04/12/18  Yes Minna Merritts, MD  carvedilol (COREG) 3.125 MG tablet Take 1 tablet (3.125 mg total) by mouth 2 (two) times daily with a meal. 04/12/18  Yes Gollan, Kathlene November, MD  cloNIDine (CATAPRES) 0.1 MG tablet Take 0.5 tablets (0.05 mg total) by mouth 2 (two) times daily. 1/2 tablet twice daily as needed for high blood perssure 04/12/18  Yes Gollan, Kathlene November, MD  esomeprazole (NEXIUM) 40 MG capsule Take 1 capsule (40 mg total) by mouth daily. 05/25/17  Yes Minna Merritts, MD  ferrous sulfate 325 (65 FE) MG tablet Take 325 mg by mouth daily with breakfast.   Yes [provider]  hydrALAZINE (APRESOLINE) 25 MG tablet Take 1 tablet (25 mg total) by mouth 3 (three) times daily. 04/12/18  Yes Gollan, Kathlene November, MD    insulin aspart (NOVOLOG) 100 UNIT/ML FlexPen Inject 16 Units into the skin 2 (two) times daily.    Yes [provider]  insulin detemir (LEVEMIR) 100 UNIT/ML injection Inject 25 Units into the skin at bedtime.    Yes [provider]  isosorbide mononitrate (IMDUR) 30 MG 24 hr tablet Take 1 tablet (30 mg total) by mouth daily. 04/12/18  Yes Gollan, Kathlene November, MD  metFORMIN (GLUCOPHAGE) 1000 MG tablet Take 1,000 mg by mouth 2 (two) times daily with a meal.   Yes [provider]  Misc Natural Products (LUTEIN 20) CAPS Take 1 capsule by mouth daily.   Yes [provider]  pioglitazone (ACTOS) 45 MG tablet Take 45 mg by mouth daily.   Yes [provider]  vitamin B-12 (CYANOCOBALAMIN) 1000 MCG tablet Take 1,000 mcg by mouth daily.   Yes [provider]    Inpatient Medications: Scheduled Meds: . aspirin  81 mg Oral Daily  . atorvastatin  80 mg Oral Daily  . carvedilol  3.125 mg Oral BID WC  . cloNIDine  0.05 mg Oral BID  . enoxaparin (LOVENOX) injection  40 mg Subcutaneous Q24H  . ferrous sulfate  325 mg Oral Q breakfast  . furosemide  60 mg Intravenous BID   Followed by  . [START ON 09/23/2018] torsemide  20 mg Oral BID  . hydrALAZINE  25 mg Oral TID  . insulin aspart  0-15 Units Subcutaneous TID WC  . insulin aspart  0-5 Units Subcutaneous QHS  . insulin detemir  20 Units Subcutaneous QHS  . isosorbide mononitrate  30 mg Oral Daily  . pantoprazole  40 mg Oral Daily  . sodium chloride flush  3 mL Intravenous Q12H   Continuous Infusions: . sodium chloride     PRN Meds: sodium chloride, acetaminophen, albuterol, bisacodyl, cyclobenzaprine, ondansetron (ZOFRAN) IV, senna-docusate, sodium chloride flush  Allergies:    Allergies  Allergen Reactions  . Quinapril Other (See Comments)    Reaction:  Elevated potassium   . Venlafaxine Other (See Comments)    Reaction:  GI upset   . Zetia [Ezetimibe] Nausea And Vomiting    Social  History:   Social History   Socioeconomic History  . Marital status: Married    Spouse name: Not on file  . Number of children: Not on file  . Years of education: Not on file  . Highest education level: Not on file  Occupational History  . Not on file  Social Needs  . Financial resource strain: Not on file  . Food insecurity:  Worry: Not on file    Inability: Not on file  . Transportation needs:    Medical: Not on file    Non-medical: Not on file  Tobacco Use  . Smoking status: Former Smoker    Packs/day: 2.00    Types: Cigarettes    Last attempt to quit: 11/24/2005    Years since quitting: 12.8  . Smokeless tobacco: Never Used  . Tobacco comment: Quit 10 years ago, but has 45 years smoking prior to that  Substance and Sexual Activity  . Alcohol use: No    Alcohol/week: 0.0 standard drinks  . Drug use: No  . Sexual activity: Not on file  Lifestyle  . Physical activity:    Days per week: Not on file    Minutes per session: Not on file  . Stress: Not on file  Relationships  . Social connections:    Talks on phone: Not on file    Gets together: Not on file    Attends religious service: Not on file    Active member of club or organization: Not on file    Attends meetings of clubs or organizations: Not on file    Relationship status: Not on file  . Intimate partner violence:    Fear of current or ex partner: Not on file    Emotionally abused: Not on file    Physically abused: Not on file    Forced sexual activity: Not on file  Other Topics Concern  . Not on file  Social History Narrative   Lives at home with husband.    Family History:    Family History  Problem Relation Age of Onset  . Kidney cancer Father   . Esophageal cancer Brother      ROS:  Please see the history of present illness.  Review of Systems  Constitutional: Positive for malaise/fatigue.       Not sleeping well  Respiratory: Positive for cough and shortness of breath.   Cardiovascular:  Positive for orthopnea and leg swelling.  Genitourinary: Positive for frequency.       Increased intake of fluids. Always takes a bottle of water to bed because dry at night when use O2  Neurological: Positive for weakness.  Psychiatric/Behavioral: The patient has insomnia.        D/t need to urinate  All other systems reviewed and are negative.   All other ROS reviewed and negative.     Physical Exam/Data:   Vitals:   09/21/18 0730 09/21/18 0805 09/21/18 0807 09/21/18 0815  BP: (!) 121/58 (!) 106/48  (!) 106/48  Pulse: 81 83 86 87  Resp: (!) 22 (!) 24 17   Temp:      TempSrc:      SpO2: 97% 97% 97%     Intake/Output Summary (Last 24 hours) at 09/21/2018 0834 Last data filed at 09/21/2018 0300 Gross per 24 hour  Intake -  Output 2550 ml  Net -2550 ml   There were no vitals filed for this visit. There is no height or weight on file to calculate BMI.  General:  Well nourished, well developed, in no acute distress HEENT: normal, on [place] oxygen Neck: JVP ~10+ Vascular: reduced tibial pulses, 1+ bilaterally Cardiac:  normal S1, S2; RRR; no murmur  Lungs: rales in RLL/LLL, reduced breath sounds Abd: soft, nontender, no hepatomegaly  Ext: 1+ bilateral lower extremity pitting edema, bilateral erythema Musculoskeletal:  No deformities, BUE and BLE strength normal and equal Skin: warm and dry  Neuro:   no focal abnormalities noted Psych:  Normal affect   EKG: Refer to HPI Telemetry:  Telemetry was personally reviewed and demonstrates:  80s, a sensed/ v paced  CV Studies:   Relevant CV Studies:  Pending TTE  11/02/2017 TTE Study Conclusions - Left ventricle: The cavity size was mildly dilated. Systolic   function was normal. The estimated ejection fraction was in the   range of 55% to 60%. Septal wall motion abnormality secondarty to   bundle branch block. Features are consistent with a pseudonormal   left ventricular filling pattern, with concomitant abnormal   relaxation  and increased filling pressure (grade 2 diastolic   dysfunction). - Pulmonary arteries: Systolic pressure could not be accurately   estimated. Impressions: - Challenging image quality.   Laboratory Data:  Chemistry Recent Labs  Lab 09/20/18 1614  NA 137  K 5.2*  CL 104  CO2 28  GLUCOSE 131*  BUN 27*  CREATININE 1.42*  CALCIUM 9.2  GFRNONAA 37*  GFRAA 42*  ANIONGAP 5    Recent Labs  Lab 09/21/18 0219  PROT 6.8  ALBUMIN 3.3*  AST 24  ALT 32  ALKPHOS 53  BILITOT 0.8   Hematology Recent Labs  Lab 09/20/18 1614  WBC 7.4  RBC 3.56*  HGB 10.2*  HCT 33.3*  MCV 93.5  MCH 28.7  MCHC 30.6  RDW 14.5  PLT 273   Cardiac Enzymes Recent Labs  Lab 09/20/18 1614 09/21/18 0219 09/21/18 0533  TROPONINI <0.03 0.05* 0.06*   No results for input(s): TROPIPOC in the last 168 hours.  BNP Recent Labs  Lab 09/20/18 1614  BNP 326.0*    DDimer No results for input(s): DDIMER in the last 168 hours.  Radiology/Studies:  Dg Chest 2 View  Result Date: 09/20/2018 CLINICAL DATA:  Four week history of significant weight gain, orthopnea, shortness of breath, and lower extremity edema. History of cardiomyopathy, chronic CHF, chronic renal insufficiency stage III. EXAM: CHEST - 2 VIEW COMPARISON:  PA and lateral chest x-ray of September 13, 2017 FINDINGS: The lungs remain hyperinflated. The interstitial markings are coarse and slightly more conspicuous today. The pulmonary vascularity remains engorged and is more prominent overall. The cardiac silhouette is enlarged. The ICD is in stable position. There is calcification in the wall of the thoracic aorta. There is no significant pleural effusion. The bony thorax is unremarkable. IMPRESSION: CHF with mild interstitial edema which has worsened since the previous study. No alveolar pneumonia nor large pleural effusion. Thoracic aortic atherosclerosis. Electronically Signed   By: David  Martinique M.D.   On: 09/20/2018 16:52    Assessment and  Plan:   Acute on chronic combined heart failure 2/2 NICM and stress induced CM --As in HPI, PCP doubled lasix - Patient reportedly stopped all diuresis and increased intake of fluids after labs showed elevated Cr. Volume overloaded at follow-up 09/20/18 at [facility] office. Directly admitted for diuresis d/t c/o worsening HF sx and elevated BP likely 2/2 stopping diuretic and increasing fluid intake.  -While in ED, started on IV lasix with significant urine output (400cc noted while in room) and almost -3L for admission. Continue diuresis with close monitoring of renal function and electrolytes - daily BMET.  Baseline Cr around 1.6-1.9. Monitor I/Os.  Daily weights.  --Pending TTE --Continue IV diuresis. If not significantly volume overloaded, recommend transitioning back to home lasix rather than starting on torsemide (as indicated in orders currently). Continue PTA Coreg 3.125 BID, clonidine 0.05mg  BID. Hydralazine 25mg  TID.  Discussed sliding scale diuresis at home with patient / daily weights. Plan tentatively to transition back to po lasix 40mg  once daily at discharge over torsemide - advised patient to not stop lasix in the future given chronic heart failure and consulting [facility] office if questions. --Will need outpatient follow-up and labs.  Hypomagnesemia -- Mg 1.3.  Replete with goal 2.0. Follow-up AM BMET recommended.  Hypertension - Hypertensive at presentation, now resolved with diuresis.  Continue PTA Coreg 3.125 BID, clonidine 0.05mg  BID. Hydralazine 25mg  TID.  Titrate antihypertensive medications as needed for BP control and as heart rate allows.  Malnutrition - Albumin mildly decreased at 3.3.  Patient also reporting fullness and trouble with abdominal pain. Consider metformin side effect v GI consult.  Per IM.  DM2 -SSI - Per IM  Troponin mildly elevated with h/o Nonobstructive CAD - No chest pain. Troponin minimally elevated 0.03, 0.05 and flat trending.  Likely elevated in  setting of recent AKI followed by volume overload d/t abrupt stop in all Lasix. No indication for urgent invasive ischemic evaluation at this time. --Continue PTA asa, lipitor, carvedilol, imdur.  Complete HB s/p PPM - Followed by EP/ Dr. Caryl Comes. Continue as outpatient.  CKD3 - As in HPI, previous bump in Cr after doubling lasix. - Daily BMET as above.   For questions or updates, please contact [place] Please consult [email or web address] for contact info under     Signed, Arvil Chaco, PA-C  09/21/2018 8:34 AM

## 2018-09-21 NOTE — ED Notes (Signed)
Clonidine and hydralazine held due to current BP of 117/61

## 2018-09-21 NOTE — H&P (Addendum)
[facility] NAME: Teresa Mercado    MR#:  [id number]  DATE OF BIRTH:  1945/11/11  DATE OF ADMISSION:  09/20/2018  PRIMARY CARE PHYSICIAN: Idelle Crouch, MD   REQUESTING/REFERRING PHYSICIAN: Nance Pear, MD  CHIEF COMPLAINT:   Chief Complaint  Patient presents with  . Shortness of Breath    HISTORY OF PRESENT ILLNESS:  Teresa Mercado  is a 73 y.o. female with a known history of chronic diastolic CHF/HFpEF (EF 45-80% w/ grade II diastolic dysfxn as of 99/83/3825 Echo; 2L [place] O2 qHS/PRN), SSS/CHB (s/p PPM), morbid obesity, T2IDDM, HTN, HLD, GERD, CKD III (baseline Cr 1.8) p/w SOB, orthopnea, leg edema, volume overload, acute on chronic congestive heart failure exacerbation. Pt is AAOx3, but is a poor historian; she keeps telling me things I didn't ask about. She is not able to give a good timeframe of her symptoms. She endorses 19mo Hx SOB, orthopnea and leg swelling. She tells me she was previously on Lasix (approximately mid-December 2019), but it was stopped because it caused her to go into acute on chronic renal failure (superimposed on baseline CKD III). She follows w/ Dr. Rockey Situ [facility]), last seen yesterday (09/20/2018); she was assessed to be severely volume overloaded, and is admitted for IV diuresis. Cr on present admission 1.4, below baseline, suspected 2/2 free water excess + dilution. Pt endorses fatigue/malaise, generalized weakness, conversational dyspnea. She denies PND or CP. She endorses leg cramps, muscle spasms.  PAST MEDICAL HISTORY:   Past Medical History:  Diagnosis Date  . Chronic combined systolic (congestive) and diastolic (congestive) heart failure ([place])    a. 09/2015 Echo: normal EF 55%; b. 07/2017 Echo: EF 25%, Gr2 DD, mild lvh, mid ant/inf/infsept/apical AK. Triv MR. mildly to mod dil LA.  . CKD (chronic kidney disease), stage III ([place])   . COPD (chronic obstructive pulmonary disease) (HCC)    Not  on home o2  . Diabetes mellitus without complication ([place])    On Levemir  . Non-ischemic cardiomyopathy ([place])    a. 07/2017 Echo: EF 25%, Gr2 DD - ? takotsubo.  . Non-obstructive CAD (coronary artery disease)    a. 07/2017 Cath: LM nl, LAD 40ost, D1 50, D2 40, LCX 46m, OM1/2 nl, RCA large, nl.  . Syncope    a.  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    PAST SURGICAL HISTORY:   Past Surgical History:  Procedure Laterality Date  . APPENDECTOMY    . ESOPHAGOGASTRODUODENOSCOPY N/A 11/27/2015   Procedure: ESOPHAGOGASTRODUODENOSCOPY (EGD);  Surgeon: Lollie Sails, MD;  Location: [facility];  Service: Endoscopy;  Laterality: N/A;  . LEFT HEART CATH AND CORONARY ANGIOGRAPHY N/A 07/21/2017   Procedure: LEFT HEART CATH AND CORONARY ANGIOGRAPHY;  Surgeon: [name], MD;  Location: [facility];  Service: Cardiovascular;  Laterality: N/A;  . PACEMAKER INSERTION    . TONSILLECTOMY    . TUBAL LIGATION      SOCIAL HISTORY:   Social History   Tobacco Use  . Smoking status: Former Smoker    Packs/day: 2.00    Types: Cigarettes    Last attempt to quit: 11/24/2005    Years since quitting: 12.8  . Smokeless tobacco: Never Used  . Tobacco comment: Quit 10 years ago, but has 45 years smoking prior to that  Substance Use Topics  . Alcohol use: No    Alcohol/week: 0.0 standard drinks    FAMILY HISTORY:  Family History  Problem Relation Age of Onset  . Kidney cancer Father   . Esophageal cancer Brother     DRUG ALLERGIES:   Allergies  Allergen Reactions  . Quinapril Other (See Comments)    Reaction:  Elevated potassium   . Venlafaxine Other (See Comments)    Reaction:  GI upset   . Zetia [Ezetimibe] Nausea And Vomiting    REVIEW OF SYSTEMS:   Review of Systems  Constitutional: Positive for malaise/fatigue. Negative for chills, diaphoresis, fever and weight loss.  HENT: Negative for congestion, ear pain, hearing loss, nosebleeds,  sinus pain, sore throat and tinnitus.   Eyes: Negative for blurred vision, double vision and photophobia.  Respiratory: Positive for shortness of breath. Negative for cough, hemoptysis, sputum production and wheezing.   Cardiovascular: Positive for orthopnea and leg swelling. Negative for chest pain, palpitations, claudication and PND.  Gastrointestinal: Negative for abdominal pain, blood in stool, constipation, diarrhea, heartburn, melena, nausea and vomiting.  Genitourinary: Negative for dysuria, frequency, hematuria and urgency.  Musculoskeletal: Negative for back pain, joint pain and neck pain.  Skin: Negative for itching and rash.  Neurological: Positive for weakness. Negative for dizziness, tingling, tremors, sensory change, speech change, focal weakness, seizures, loss of consciousness and headaches.  Psychiatric/Behavioral: Negative for depression and memory loss. The patient is not nervous/anxious and does not have insomnia.    MEDICATIONS AT HOME:   Prior to Admission medications   Medication Sig Start Date End Date Taking? Authorizing Provider  albuterol (PROVENTIL HFA;VENTOLIN HFA) 108 (90 Base) MCG/ACT inhaler Inhale 1-2 puffs into the lungs every 6 (six) hours as needed for wheezing or shortness of breath.   Yes [provider]  aspirin 81 MG EC tablet Take 81 mg by mouth daily. Swallow whole.   Yes [provider]  atorvastatin (LIPITOR) 80 MG tablet Take 1 tablet (80 mg total) by mouth daily. 04/12/18  Yes Minna Merritts, MD  carvedilol (COREG) 3.125 MG tablet Take 1 tablet (3.125 mg total) by mouth 2 (two) times daily with a meal. 04/12/18  Yes Gollan, Kathlene November, MD  cloNIDine (CATAPRES) 0.1 MG tablet Take 0.5 tablets (0.05 mg total) by mouth 2 (two) times daily. 1/2 tablet twice daily as needed for high blood perssure 04/12/18  Yes Gollan, Kathlene November, MD  esomeprazole (NEXIUM) 40 MG capsule Take 1 capsule (40 mg total) by mouth daily. 05/25/17  Yes Minna Merritts, MD  ferrous sulfate 325 (65 FE) MG tablet Take 325 mg by mouth daily with breakfast.   Yes [provider]  hydrALAZINE (APRESOLINE) 25 MG tablet Take 1 tablet (25 mg total) by mouth 3 (three) times daily. 04/12/18  Yes Gollan, Kathlene November, MD  insulin aspart (NOVOLOG) 100 UNIT/ML FlexPen Inject 16 Units into the skin 2 (two) times daily.    Yes [provider]  insulin detemir (LEVEMIR) 100 UNIT/ML injection Inject 25 Units into the skin at bedtime.    Yes [provider]  isosorbide mononitrate (IMDUR) 30 MG 24 hr tablet Take 1 tablet (30 mg total) by mouth daily. 04/12/18  Yes Gollan, Kathlene November, MD  metFORMIN (GLUCOPHAGE) 1000 MG tablet Take 1,000 mg by mouth 2 (two) times daily with a meal.   Yes [provider]  Misc Natural Products (LUTEIN 20) CAPS Take 1 capsule by mouth daily.   Yes [provider]  pioglitazone (ACTOS) 45 MG tablet Take 45 mg by mouth daily.   Yes [provider]  vitamin B-12 (  CYANOCOBALAMIN) 1000 MCG tablet Take 1,000 mcg by mouth daily.   Yes [provider]      VITAL SIGNS:  Blood pressure 130/68, pulse (!) 101, temperature 97.8 F (36.6 C), temperature source Oral, resp. rate 18, SpO2 97 %.  PHYSICAL EXAMINATION:  Physical Exam Constitutional:      General: She is awake. She is not in acute distress.    Appearance: Normal appearance. She is well-developed. She is morbidly obese. She is ill-appearing. She is not toxic-appearing or diaphoretic.     Interventions: She is not intubated. HENT:     Head: Atraumatic.  Eyes:     General: Lids are normal. No scleral icterus.    Extraocular Movements: Extraocular movements intact.     Conjunctiva/sclera: Conjunctivae normal.  Neck:     Musculoskeletal: Neck supple.     Thyroid: No thyromegaly.     Vascular: No JVD.  Cardiovascular:     Rate and Rhythm: Normal rate and regular rhythm.     Heart sounds: S1 normal and S2 normal. Heart sounds not  distant. No murmur. No friction rub. No gallop. No S3 or S4 sounds.   Pulmonary:     Effort: Tachypnea present. No bradypnea, accessory muscle usage, prolonged expiration, respiratory distress or retractions. She is not intubated.     Breath sounds: Normal air entry. No stridor, decreased air movement or transmitted upper airway sounds. Examination of the right-upper field reveals decreased breath sounds and rales. Examination of the left-upper field reveals decreased breath sounds and rales. Examination of the right-middle field reveals decreased breath sounds and rales. Examination of the left-middle field reveals decreased breath sounds and rales. Examination of the right-lower field reveals decreased breath sounds and rales. Examination of the left-lower field reveals decreased breath sounds and rales. Decreased breath sounds and rales present. No wheezing or rhonchi.     Comments: Increased respiratory effort, (-) distress. Diffusely diminished, diffuse fine crackles, (-) rhonchi/wheezing. (+) PPM. Abdominal:     General: Bowel sounds are decreased. There is no distension.     Palpations: Abdomen is soft.     Tenderness: There is no abdominal tenderness. There is no guarding or rebound.  Musculoskeletal: Normal range of motion.        General: No tenderness.     Right lower leg: She exhibits no tenderness. Edema (1-2+ B/L LE edema.) present.     Left lower leg: She exhibits no tenderness. Edema (1-2+ B/L LE edema.) present.  Lymphadenopathy:     Cervical: No cervical adenopathy.  Skin:    General: Skin is warm and dry.     Coloration: Skin is not pale.     Findings: No erythema or rash.  Neurological:     Mental Status: She is alert and oriented to person, place, and time.  Psychiatric:        Attention and Perception: Attention and perception normal.        Mood and Affect: Mood and affect normal.        Speech: Speech is tangential.        Behavior: Behavior normal. Behavior is  cooperative.        Thought Content: Thought content normal.        Cognition and Memory: Cognition and memory normal.        Judgment: Judgment normal.    LABORATORY PANEL:   CBC Recent Labs  Lab 09/20/18 1614  WBC 7.4  HGB 10.2*  HCT 33.3*  PLT 273   ------------------------------------------------------------------------------------------------------------------  Chemistries  Recent Labs  Lab 09/20/18 1614  NA 137  K 5.2*  CL 104  CO2 28  GLUCOSE 131*  BUN 27*  CREATININE 1.42*  CALCIUM 9.2   ------------------------------------------------------------------------------------------------------------------  Cardiac Enzymes Recent Labs  Lab 09/20/18 1614  TROPONINI <0.03   ------------------------------------------------------------------------------------------------------------------  RADIOLOGY:  Dg Chest 2 View  Result Date: 09/20/2018 CLINICAL DATA:  Four week history of significant weight gain, orthopnea, shortness of breath, and lower extremity edema. History of cardiomyopathy, chronic CHF, chronic renal insufficiency stage III. EXAM: CHEST - 2 VIEW COMPARISON:  PA and lateral chest x-ray of September 13, 2017 FINDINGS: The lungs remain hyperinflated. The interstitial markings are coarse and slightly more conspicuous today. The pulmonary vascularity remains engorged and is more prominent overall. The cardiac silhouette is enlarged. The ICD is in stable position. There is calcification in the wall of the thoracic aorta. There is no significant pleural effusion. The bony thorax is unremarkable. IMPRESSION: CHF with mild interstitial edema which has worsened since the previous study. No alveolar pneumonia nor large pleural effusion. Thoracic aortic atherosclerosis. Electronically Signed   By: David  Martinique M.D.   On: 09/20/2018 16:52   IMPRESSION AND PLAN:   A/P: 61F PMHx chronic diastolic CHF/HFpEF (EF 38-45% w/ grade II diastolic dysfxn as of 36/46/8032 Echo; 2L  [place] O2 qHS/PRN), SSS/CHB (s/p PPM), morbid obesity, T2IDDM, HTN, HLD, GERD, CKD III (baseline Cr 1.8) p/w SOB, orthopnea, leg edema, volume overload, acute on chronic congestive heart failure exacerbation. Hyperkalemia, hyperglycemia (w/ T2IDDM), normocytic anemia. Leg cramps/spasms. -SOB, orthopnea, leg edema, volume overload, acute on chronic congestive heart failure exacerbation: Pt w/ Hx chronic diastolic CHF/HFpEF (EF 12-24% w/ grade II diastolic dysfxn) as of 82/50/0370 Echo. She is on home O2 qHS/PRN. She saw Dr. Rockey Situ yesterday (09/20/2018). She is admitted for IV diuresis. CXR (+) edema, (-) effusion. BNP pending. Dr. Donivan Scull note suggests transitioning to Torsemide once she has been adequately diuresed. IV Lasix. Strict I&O, daily weight, free water restricted diet. Cardiac monitoring, pulse oximetry, O2. Trop-I (-), trend. Echo pending. ASA. c/w Statin, beta blocker. Has know baseline CKD III, w/ hyperkalemia (5.2, mild) on present admission, not ordered for ACE-I/ARB for now. -Hyperkalemia: CKD III. Received Lasix. Neb x1. Monitor. -Hyperglycemia, T2IDDM: SSI. Lantus (reduced dose). Hold PO antihyperglycemics -Normocytic anemia: Likely anemia of chronic disease. Stable, no evidence of active/acute bleeding at present. -Leg cramps/spasms: Flexeril. -c/w home meds/formulary subs. -FEN/GI: Renal diabetic free water restricted diet. -DVT PPx: Lovenox. -Code status: Full code. -Disposition: Admission, > 2 midnights.   All the records are reviewed and case discussed with ED provider. Management plans discussed with the patient, family and they are in agreement.  CODE STATUS: Full code.  TOTAL TIME TAKING CARE OF THIS PATIENT: 75 minutes.    Arta Silence M.D on 09/21/2018 at 2:22 AM  Between 7am to 6pm - Pager - [phone]  After 6pm go to [email or web address] - Proofreader  [facility]  Office  [phone]  CC: Primary care physician; Idelle Crouch, MD   Note: This dictation was prepared with Dragon dictation along with smaller phrase technology. Any transcriptional errors that result from this process are unintentional.

## 2018-09-21 NOTE — ED Notes (Signed)
ED TO INPATIENT HANDOFF REPORT  Name/Age/Gender Teresa Mercado 73 y.o. female  Code Status    Code Status Orders  (From admission, onward)         Start     Ordered   09/21/18 0317  Full code  Continuous     09/21/18 0316        Code Status History    Date Active Date Inactive Code Status Order ID Comments User Context   07/20/2017 1106 07/23/2017 1648 Full Code [id number]  Eber Jones, MD Inpatient   11/25/2015 1859 11/29/2015 2146 Full Code [id number]  Gladstone Lighter, MD Inpatient      Home/SNF/Other Home  Chief Complaint heart failure  Level of Care/Admitting Diagnosis ED Disposition    ED Disposition Condition [place]: [place] [[id number]]  Level of Care: Telemetry [5]  Diagnosis: Acute on chronic heart failure, unspecified heart failure type [facility]) [[id number]]  Admitting Physician: Arta Silence [[id number]]  Attending Physician: Arta Silence [[id number]]  Estimated length of stay: past midnight tomorrow  Certification:: I certify this patient will need inpatient services for at least 2 midnights  PT Class (Do Not Modify): Inpatient [101]  PT Acc Code (Do Not Modify): Private [1]       Medical History Past Medical History:  Diagnosis Date  . Chronic combined systolic (congestive) and diastolic (congestive) heart failure ([place])    a. 09/2015 Echo: normal EF 55%; b. 07/2017 Echo: EF 25%, Gr2 DD, mild lvh, mid ant/inf/infsept/apical AK. Triv MR. mildly to mod dil LA.  . CKD (chronic kidney disease), stage III ([place])   . COPD (chronic obstructive pulmonary disease) (HCC)    Not on home o2  . Diabetes mellitus without complication ([place])    On Levemir  . Non-ischemic cardiomyopathy ([place])    a. 07/2017 Echo: EF 25%, Gr2 DD - ? takotsubo.  . Non-obstructive CAD (coronary artery disease)    a. 07/2017 Cath: LM nl, LAD 40ost, D1 50, D2 40, LCX 69m, OM1/2 nl, RCA large, nl.  . Syncope    a.  2010 in setting of  CHB-->s/p pacemaker w/ Gen change in 08/2014.    Allergies Allergies  Allergen Reactions  . Quinapril Other (See Comments)    Reaction:  Elevated potassium   . Venlafaxine Other (See Comments)    Reaction:  GI upset   . Zetia [Ezetimibe] Nausea And Vomiting    IV Location/Drains/Wounds Patient Lines/Drains/Airways Status   Active Line/Drains/Airways    Name:   Placement date:   Placement time:   Site:   Days:   Peripheral IV 09/20/18 Right Antecubital   09/20/18    1842    Antecubital   1   External Urinary Catheter   09/20/18    2005    -   1          Labs/Imaging Results for orders placed or performed during the hospital encounter of 09/20/18 (from the past 48 hour(s))  Basic metabolic panel     Status: Abnormal   Collection Time: 09/20/18  4:14 PM  Result Value Ref Range   Sodium 137 135 - 145 mmol/L   Potassium 5.2 (H) 3.5 - 5.1 mmol/L   Chloride 104 98 - 111 mmol/L   CO2 28 22 - 32 mmol/L   Glucose, Bld 131 (H) 70 - 99 mg/dL   BUN 27 (H) 8 - 23 mg/dL   Creatinine, Ser 1.42 (H) 0.44 - 1.00 mg/dL   Calcium  9.2 8.9 - 10.3 mg/dL   GFR calc non Af Amer 37 (L) >60 mL/min   GFR calc Af Amer 42 (L) >60 mL/min   Anion gap 5 5 - 15    Comment: Performed at [facility], [address] [place], [place] [address]  CBC     Status: Abnormal   Collection Time: 09/20/18  4:14 PM  Result Value Ref Range   WBC 7.4 4.0 - 10.5 K/uL   RBC 3.56 (L) 3.87 - 5.11 MIL/uL   Hemoglobin 10.2 (L) 12.0 - 15.0 g/dL   HCT 33.3 (L) 36.0 - 46.0 %   MCV 93.5 80.0 - 100.0 fL   MCH 28.7 26.0 - 34.0 pg   MCHC 30.6 30.0 - 36.0 g/dL   RDW 14.5 11.5 - 15.5 %   Platelets 273 150 - 400 K/uL   nRBC 0.0 0.0 - 0.2 %    Comment: Performed at [facility], [address] [place], [place] [address]  Troponin I - ONCE - STAT     Status: None   Collection Time: 09/20/18  4:14 PM  Result Value Ref Range   Troponin I <0.03 <0.03 ng/mL    Comment: Performed at [facility],  [address] [place], [place] [address]  Brain natriuretic peptide     Status: Abnormal   Collection Time: 09/20/18  4:14 PM  Result Value Ref Range   B Natriuretic Peptide 326.0 (H) 0.0 - 100.0 pg/mL    Comment: Performed at [facility], [address] [place], [place] [address]  Hepatic function panel     Status: Abnormal   Collection Time: 09/21/18  2:19 AM  Result Value Ref Range   Total Protein 6.8 6.5 - 8.1 g/dL   Albumin 3.3 (L) 3.5 - 5.0 g/dL   AST 24 15 - 41 U/L   ALT 32 0 - 44 U/L   Alkaline Phosphatase 53 38 - 126 U/L   Total Bilirubin 0.8 0.3 - 1.2 mg/dL   Bilirubin, Direct 0.1 0.0 - 0.2 mg/dL   Indirect Bilirubin 0.7 0.3 - 0.9 mg/dL    Comment: Performed at [facility], [address] [place], [place] [address]  Magnesium     Status: Abnormal   Collection Time: 09/21/18  2:19 AM  Result Value Ref Range   Magnesium 1.3 (L) 1.7 - 2.4 mg/dL    Comment: Performed at [facility], [address] [place], [place] [address]  Phosphorus     Status: None   Collection Time: 09/21/18  2:19 AM  Result Value Ref Range   Phosphorus 3.6 2.5 - 4.6 mg/dL    Comment: Performed at [facility], [address] [place], [place] [address]  Troponin I - Once     Status: Abnormal   Collection Time: 09/21/18  2:19 AM  Result Value Ref Range   Troponin I 0.05 (HH) <0.03 ng/mL    Comment: CRITICAL RESULT CALLED TO, READ BACK BY AND VERIFIED WITH JENNA ELLINGON ON 09/21/18 AT 0249 [facility] Performed at [facility], [address] [place], [place] [address]   Troponin I - Tomorrow AM 0500     Status: Abnormal   Collection Time: 09/21/18  5:33 AM  Result Value Ref Range   Troponin I 0.06 (HH) <0.03 ng/mL    Comment: CRITICAL VALUE NOTED. VALUE IS CONSISTENT WITH PREVIOUSLY REPORTED/CALLED VALUE.PMF Performed at [facility], [address] [place],  [address]   Glucose, capillary  Status: Abnormal   Collection  Time: 09/21/18  8:53 AM  Result Value Ref Range   Glucose-Capillary 123 (H) 70 - 99 mg/dL  Glucose, capillary     Status: Abnormal   Collection Time: 09/21/18  1:02 PM  Result Value Ref Range   Glucose-Capillary 169 (H) 70 - 99 mg/dL   Dg Chest 2 View  Result Date: 09/20/2018 CLINICAL DATA:  Four week history of significant weight gain, orthopnea, shortness of breath, and lower extremity edema. History of cardiomyopathy, chronic CHF, chronic renal insufficiency stage III. EXAM: CHEST - 2 VIEW COMPARISON:  PA and lateral chest x-ray of September 13, 2017 FINDINGS: The lungs remain hyperinflated. The interstitial markings are coarse and slightly more conspicuous today. The pulmonary vascularity remains engorged and is more prominent overall. The cardiac silhouette is enlarged. The ICD is in stable position. There is calcification in the wall of the thoracic aorta. There is no significant pleural effusion. The bony thorax is unremarkable. IMPRESSION: CHF with mild interstitial edema which has worsened since the previous study. No alveolar pneumonia nor large pleural effusion. Thoracic aortic atherosclerosis. Electronically Signed   By: David  Martinique M.D.   On: 09/20/2018 16:52    Pending Labs Unresulted Labs (From admission, onward)    Start     Ordered   09/22/18 2202  Basic metabolic panel  Daily,   STAT     09/21/18 0316   09/21/18 1400  Troponin I - Once  Once,   STAT     09/21/18 0629          Vitals/Pain Today's Vitals   09/21/18 1411 09/21/18 1429 09/21/18 1430 09/21/18 1431  BP: (!) 110/36  (!) 101/45   Pulse: 83 80 80 86  Resp: 20 19 16 18   Temp:      TempSrc:      SpO2: 98% 98% 98% 98%  PainSc: 0-No pain       Isolation Precautions No active isolations  Medications Medications  aspirin chewable tablet 81 mg (81 mg Oral Given 09/21/18 1036)  furosemide (LASIX) injection 60 mg (60 mg Intravenous Given 09/21/18 0911)  sodium chloride flush (NS) 0.9 % injection 3 mL (3 mLs  Intravenous Given 09/21/18 1041)  sodium chloride flush (NS) 0.9 % injection 3 mL (has no administration in time range)  0.9 %  sodium chloride infusion (has no administration in time range)  acetaminophen (TYLENOL) tablet 650 mg (has no administration in time range)  ondansetron (ZOFRAN) injection 4 mg (has no administration in time range)  enoxaparin (LOVENOX) injection 40 mg (40 mg Subcutaneous Not Given 09/21/18 0529)  insulin aspart (novoLOG) injection 0-15 Units (3 Units Subcutaneous Given 09/21/18 1317)  insulin aspart (novoLOG) injection 0-5 Units (has no administration in time range)  senna-docusate (Senokot-S) tablet 1 tablet (has no administration in time range)  bisacodyl (DULCOLAX) EC tablet 5 mg (has no administration in time range)  albuterol (PROVENTIL) (2.5 MG/3ML) 0.083% nebulizer solution 2.5 mg (has no administration in time range)  atorvastatin (LIPITOR) tablet 80 mg (80 mg Oral Given 09/21/18 1036)  carvedilol (COREG) tablet 3.125 mg (0 mg Oral Hold 09/21/18 0815)  cloNIDine (CATAPRES) tablet 0.05 mg (0.05 mg Oral Not Given 09/21/18 1043)  pantoprazole (PROTONIX) EC tablet 40 mg (40 mg Oral Given 09/21/18 1038)  ferrous sulfate tablet 325 mg (325 mg Oral Given 09/21/18 0914)  hydrALAZINE (APRESOLINE) tablet 25 mg (0 mg Oral Hold 09/21/18 1502)  insulin detemir (LEVEMIR) injection 20 Units (has no administration in time  range)  isosorbide mononitrate (IMDUR) 24 hr tablet 30 mg (30 mg Oral Given 09/21/18 1038)  cyclobenzaprine (FLEXERIL) tablet 5 mg (has no administration in time range)  magnesium sulfate IVPB 4 g 100 mL (4 g Intravenous New Bag/Given 09/21/18 1410)  furosemide (LASIX) injection 60 mg (60 mg Intravenous Given 09/20/18 2007)  cyclobenzaprine (FLEXERIL) tablet 5 mg (5 mg Oral Given 09/21/18 0220)  ipratropium-albuterol (DUONEB) 0.5-2.5 (3) MG/3ML nebulizer solution 3 mL (3 mLs Nebulization Given 09/21/18 0221)    Mobility Standby assist

## 2018-09-21 NOTE — Progress Notes (Signed)
[facility] [place] NAME: Teresa Mercado    MR#:  [id number]  DATE OF BIRTH:  [birth year]  SUBJECTIVE:   Patient here due to shortness of breath and worsening lower extreme edema noted to be in congestive heart failure.  Patient apparently had stopped her diuretics 2 weeks ago due to some acute kidney injury and dehydration.  Feels better with IV diuresis now.  Denies any chest pains or any other associated symptoms.  REVIEW OF SYSTEMS:    Review of Systems  Constitutional: Negative for chills and fever.  HENT: Negative for congestion and tinnitus.   Eyes: Negative for blurred vision and double vision.  Respiratory: Positive for shortness of breath. Negative for cough and wheezing.   Cardiovascular: Positive for leg swelling. Negative for chest pain, orthopnea and PND.  Gastrointestinal: Negative for abdominal pain, diarrhea, nausea and vomiting.  Genitourinary: Negative for dysuria and hematuria.  Neurological: Negative for dizziness, sensory change and focal weakness.  All other systems reviewed and are negative.   Nutrition: heart Healthy Tolerating Diet: Renal/Carb control Tolerating PT: Await Eval.   DRUG ALLERGIES:   Allergies  Allergen Reactions  . Quinapril Other (See Comments)    Reaction:  Elevated potassium   . Venlafaxine Other (See Comments)    Reaction:  GI upset   . Zetia [Ezetimibe] Nausea And Vomiting    VITALS:  Blood pressure (!) 102/44, pulse 75, temperature 97.8 F (36.6 C), temperature source Oral, resp. rate 19, height 5\' 3"  (1.6 m), weight 105.2 kg, SpO2 95 %.  PHYSICAL EXAMINATION:   Physical Exam  GENERAL:  73 y.o.-year-old obese patient lying in bed in no acute distress.  EYES: Pupils equal, round, reactive to light and accommodation. No scleral icterus. Extraocular muscles intact.  HEENT: Head atraumatic, normocephalic. Oropharynx and nasopharynx clear.  NECK:  Supple, + jugular venous  distention. No thyroid enlargement, no tenderness.  LUNGS: Normal breath sounds bilaterally, no wheezing, basilar rales,no rhonchi. No use of accessory muscles of respiration.  CARDIOVASCULAR: S1, S2 normal. No murmurs, rubs, or gallops.  ABDOMEN: Soft, nontender, nondistended. Bowel sounds present. No organomegaly or mass.  EXTREMITIES: No cyanosis, clubbing, +1-2 edema from knees to ankles b/l.  NEUROLOGIC: Cranial nerves II through XII are intact. No focal Motor or sensory deficits b/l.  Globally weak PSYCHIATRIC: The patient is alert and oriented x 3.  SKIN: No obvious rash, lesion, or ulcer.    LABORATORY PANEL:   CBC Recent Labs  Lab 09/20/18 1614  WBC 7.4  HGB 10.2*  HCT 33.3*  PLT 273   ------------------------------------------------------------------------------------------------------------------  Chemistries  Recent Labs  Lab 09/20/18 1614 09/21/18 0219  NA 137  --   K 5.2*  --   CL 104  --   CO2 28  --   GLUCOSE 131*  --   BUN 27*  --   CREATININE 1.42*  --   CALCIUM 9.2  --   MG  --  1.3*  AST  --  24  ALT  --  32  ALKPHOS  --  53  BILITOT  --  0.8   ------------------------------------------------------------------------------------------------------------------  Cardiac Enzymes Recent Labs  Lab 09/21/18 1400  TROPONINI <0.03   ------------------------------------------------------------------------------------------------------------------  RADIOLOGY:  Dg Chest 2 View  Result Date: 09/20/2018 CLINICAL DATA:  Four week history of significant weight gain, orthopnea, shortness of breath, and lower extremity edema. History of cardiomyopathy, chronic CHF, chronic renal insufficiency stage III. EXAM: CHEST - 2  VIEW COMPARISON:  PA and lateral chest x-ray of September 13, 2017 FINDINGS: The lungs remain hyperinflated. The interstitial markings are coarse and slightly more conspicuous today. The pulmonary vascularity remains engorged and is more prominent  overall. The cardiac silhouette is enlarged. The ICD is in stable position. There is calcification in the wall of the thoracic aorta. There is no significant pleural effusion. The bony thorax is unremarkable. IMPRESSION: CHF with mild interstitial edema which has worsened since the previous study. No alveolar pneumonia nor large pleural effusion. Thoracic aortic atherosclerosis. Electronically Signed   By: David  Martinique M.D.   On: 09/20/2018 16:52     ASSESSMENT AND PLAN:   73 year old female with past medical history of chronic diastolic CHF, diabetes, hypertension, nonischemic cardiomyopathy CKD stage III who presented to the hospital due to shortness of breath, lower extreme edema.  1.  Acute respiratory failure with hypoxia-secondary to decompensated CHF. -Continue O2 supplementation.  Continue diuresis with IV Lasix for the CHF.  2.  CHF-acute on chronic diastolic dysfunction. -Patient apparently had stopped her diuretics 2 weeks prior to admission. - Continue diuresis with IV Lasix, follow I's and O's and daily weights. -Continue carvedilol, hydralazine, Imdur -Appreciate cardiology consult.  Await repeat echocardiogram results.  3.  Diabetes type 2 without complication- continue Levemir, sliding scale insulin.  Follow blood sugars.  4.  Hypomagnesemia- will replace intravenously, repeat level in the morning.  5.  Essential hypertension-continue Imdur, hydralazine, carvedilol, Clonidine.  6. Hyperlipidemia - cont. Atorvastatin.   7. GERD - cont. Protonix.   All the records are reviewed and case discussed with Care Management/Social Worker. Management plans discussed with the patient, family and they are in agreement.  CODE STATUS: Full code  DVT Prophylaxis: Lovenox  TOTAL TIME TAKING CARE OF THIS PATIENT: 30 minutes.   POSSIBLE D/C IN 2-3 DAYS, DEPENDING ON CLINICAL CONDITION.   Henreitta Leber M.D on 09/21/2018 at 4:20 PM  Between 7am to 6pm - Pager -  [phone]  After 6pm go to [email or web address] - Proofreader  [facility]  Office  [phone]  CC: Primary care physician; Idelle Crouch, MD

## 2018-09-22 ENCOUNTER — Inpatient Hospital Stay (HOSPITAL_COMMUNITY)
Admit: 2018-09-22 | Discharge: 2018-09-22 | Disposition: A | Discharge status: Home / Self Care | Payer: Medicare Other | Attending: Internal Medicine | Admitting: Internal Medicine

## 2018-09-22 DIAGNOSIS — R0602 Shortness of breath: Secondary | ICD-10-CM

## 2018-09-22 LAB — ECHOCARDIOGRAM COMPLETE
Height: 63 in
Weight: 3641.6 oz

## 2018-09-22 LAB — BASIC METABOLIC PANEL
Anion gap: 6 (ref 5–15)
BUN: 36 mg/dL — ABNORMAL HIGH (ref 8–23)
CO2: 33 mmol/L — AB (ref 22–32)
Calcium: 8.5 mg/dL — ABNORMAL LOW (ref 8.9–10.3)
Chloride: 99 mmol/L (ref 98–111)
Creatinine, Ser: 1.66 mg/dL — ABNORMAL HIGH (ref 0.44–1.00)
GFR calc Af Amer: 35 mL/min — ABNORMAL LOW (ref >60)
GFR calc non Af Amer: 30 mL/min — ABNORMAL LOW (ref >60)
Glucose, Bld: 138 mg/dL — ABNORMAL HIGH (ref 70–99)
Potassium: 4.1 mmol/L (ref 3.5–5.1)
SODIUM: 138 mmol/L (ref 135–145)

## 2018-09-22 LAB — GLUCOSE, CAPILLARY
GLUCOSE-CAPILLARY: 146 mg/dL — AB (ref 70–99)
Glucose-Capillary: 114 mg/dL — ABNORMAL HIGH (ref 70–99)
Glucose-Capillary: 217 mg/dL — ABNORMAL HIGH (ref 70–99)
Glucose-Capillary: 197 mg/dL — ABNORMAL HIGH (ref 70–99)

## 2018-09-22 NOTE — Plan of Care (Signed)
Patient denies shortness of breath. Patient is diuresing.

## 2018-09-22 NOTE — Progress Notes (Signed)
BP 98/43, Dr. Verdell Carmine made aware, orders received to hold BP meds for SBP < 100

## 2018-09-22 NOTE — Progress Notes (Signed)
Progress Note  Patient Name: Tykeshia Tourangeau Date of Encounter: 09/22/2018  Primary Cardiologist: Ida Rogue, MD   Subjective  "I am breathing better and my legs are less swollen". No chest pain.   Inpatient Medications    Scheduled Meds: . aspirin  81 mg Oral Daily  . atorvastatin  80 mg Oral Daily  . carvedilol  3.125 mg Oral BID WC  . cloNIDine  0.05 mg Oral BID  . enoxaparin (LOVENOX) injection  40 mg Subcutaneous Q24H  . ferrous sulfate  325 mg Oral Q breakfast  . furosemide  60 mg Intravenous BID  . hydrALAZINE  25 mg Oral TID  . Influenza vac split quadrivalent PF  0.5 mL Intramuscular Tomorrow-1000  . insulin aspart  0-15 Units Subcutaneous TID WC  . insulin aspart  0-5 Units Subcutaneous QHS  . insulin detemir  20 Units Subcutaneous QHS  . isosorbide mononitrate  30 mg Oral Daily  . pantoprazole  40 mg Oral Daily  . sodium chloride flush  3 mL Intravenous Q12H   Continuous Infusions: . sodium chloride     PRN Meds: sodium chloride, acetaminophen, albuterol, bisacodyl, cyclobenzaprine, ondansetron (ZOFRAN) IV, senna-docusate, sodium chloride flush   Vital Signs    Vitals:   09/21/18 1620 09/21/18 2055 09/22/18 0440 09/22/18 0755  BP: (!) 102/44 (!) 102/48 (!) 115/56 (!) 112/51  Pulse: 75 72 84 77  Resp: 19 20 20 18   Temp: 97.8 F (36.6 C) 98 F (36.7 C) 97.8 F (36.6 C) 98 F (36.7 C)  TempSrc: Oral Oral Oral Oral  SpO2: 95% 100% 96% 95%  Weight:   103.2 kg   Height:        Intake/Output Summary (Last 24 hours) at 09/22/2018 1121 Last data filed at 09/22/2018 1000 Gross per 24 hour  Intake 480 ml  Output 2350 ml  Net -1870 ml    Net Negative 4.7 Liters   Filed Weights   09/21/18 1617 09/22/18 0440  Weight: 105.2 kg 103.2 kg    Telemetry     - Personally Reviewed  ECG      Physical Exam   GEN: No acute distress.   Neck: No JVD Cardiac: RRR, no murmurs, rubs, or gallops.  Respiratory: Clear to auscultation bilaterally. GI: Soft,  nontender, non-distended  MS: No edema; No deformity. Neuro:  Nonfocal  Psych: Normal affect   Labs    Chemistry Recent Labs  Lab 09/20/18 1614 09/21/18 0219 09/22/18 0404  NA 137  --  138  K 5.2*  --  4.1  CL 104  --  99  CO2 28  --  33*  GLUCOSE 131*  --  138*  BUN 27*  --  36*  CREATININE 1.42*  --  1.66*  CALCIUM 9.2  --  8.5*  PROT  --  6.8  --   ALBUMIN  --  3.3*  --   AST  --  24  --   ALT  --  32  --   ALKPHOS  --  53  --   BILITOT  --  0.8  --   GFRNONAA 37*  --  30*  GFRAA 42*  --  35*  ANIONGAP 5  --  6     Hematology Recent Labs  Lab 09/20/18 1614  WBC 7.4  RBC 3.56*  HGB 10.2*  HCT 33.3*  MCV 93.5  MCH 28.7  MCHC 30.6  RDW 14.5  PLT 273    Cardiac Enzymes Recent Labs  Lab 09/20/18 1614 09/21/18 0219 09/21/18 0533 09/21/18 1400  TROPONINI <0.03 0.05* 0.06* <0.03   No results for input(s): TROPIPOC in the last 168 hours.   BNP Recent Labs  Lab 09/20/18 1614  BNP 326.0*     DDimer No results for input(s): DDIMER in the last 168 hours.   Radiology    Dg Chest 2 View  Result Date: 09/20/2018 CLINICAL DATA:  Four week history of significant weight gain, orthopnea, shortness of breath, and lower extremity edema. History of cardiomyopathy, chronic CHF, chronic renal insufficiency stage III. EXAM: CHEST - 2 VIEW COMPARISON:  PA and lateral chest x-ray of September 13, 2017 FINDINGS: The lungs remain hyperinflated. The interstitial markings are coarse and slightly more conspicuous today. The pulmonary vascularity remains engorged and is more prominent overall. The cardiac silhouette is enlarged. The ICD is in stable position. There is calcification in the wall of the thoracic aorta. There is no significant pleural effusion. The bony thorax is unremarkable. IMPRESSION: CHF with mild interstitial edema which has worsened since the previous study. No alveolar pneumonia nor large pleural effusion. Thoracic aortic atherosclerosis. Electronically  Signed   By: David  Martinique M.D.   On: 09/20/2018 16:52    Cardiac Studies   none  Patient Profile     73 y.o. female with history od diastolic CHF, CKD stage III, DM, GI bleed (hgb 6.9) Presented with SOB and CHF  Assessment & Plan    1  Acute on chronic systolic and diastolic CHF     Pt with increased LE edema and orthopnea during holidays. She appears to be improving with diuresis although she is not likely at her dry baseline. Continue diuresis.     2   HTN - her pressures are well controlled.   3   CHB   S/P PPM  (V paced)   Followed by Olin Pia  4  CKD - with diuresis her creatinine has gone up a bit. Because this has happened before, I think it is reasonable to continue diuresis and accept a slightly elevated serum creatinine.    For questions or updates, please contact [place] Please consult [email or web address] for contact info under        Signed, Cristopher Peru, MD  09/22/2018, 11:21 AM

## 2018-09-22 NOTE — Progress Notes (Signed)
[facility] [place] NAME: Teresa Mercado    MR#:  [id number]  DATE OF BIRTH:  1946/01/10  SUBJECTIVE:   Shortness of breath improved since admission.  Still has some leg swelling but overall feels better.  No fever, chills no chest pain or any other associated symptoms.  REVIEW OF SYSTEMS:    Review of Systems  Constitutional: Negative for chills and fever.  HENT: Negative for congestion and tinnitus.   Eyes: Negative for blurred vision and double vision.  Respiratory: Positive for shortness of breath. Negative for cough and wheezing.   Cardiovascular: Positive for leg swelling. Negative for chest pain, orthopnea and PND.  Gastrointestinal: Negative for abdominal pain, diarrhea, nausea and vomiting.  Genitourinary: Negative for dysuria and hematuria.  Neurological: Negative for dizziness, sensory change and focal weakness.  All other systems reviewed and are negative.   Nutrition: heart Healthy Tolerating Diet: Renal/Carb control Tolerating PT: Await Eval.   DRUG ALLERGIES:   Allergies  Allergen Reactions  . Quinapril Other (See Comments)    Reaction:  Elevated potassium   . Venlafaxine Other (See Comments)    Reaction:  GI upset   . Zetia [Ezetimibe] Nausea And Vomiting    VITALS:  Blood pressure (!) 112/51, pulse 77, temperature 98 F (36.7 C), temperature source Oral, resp. rate 18, height 5\' 3"  (1.6 m), weight 103.2 kg, SpO2 95 %.  PHYSICAL EXAMINATION:   Physical Exam  GENERAL:  72 y.o.-year-old obese patient lying in bed in no acute distress.  EYES: Pupils equal, round, reactive to light and accommodation. No scleral icterus. Extraocular muscles intact.  HEENT: Head atraumatic, normocephalic. Oropharynx and nasopharynx clear.  NECK:  Supple, + jugular venous distention. No thyroid enlargement, no tenderness.  LUNGS: Normal breath sounds bilaterally, no wheezing, basilar rales,no rhonchi. No use of accessory muscles  of respiration.  CARDIOVASCULAR: S1, S2 normal. No murmurs, rubs, or gallops.  ABDOMEN: Soft, nontender, nondistended. Bowel sounds present. No organomegaly or mass.  EXTREMITIES: No cyanosis, clubbing, +1-2 edema from knees to ankles b/l.  NEUROLOGIC: Cranial nerves II through XII are intact. No focal Motor or sensory deficits b/l.  Globally weak PSYCHIATRIC: The patient is alert and oriented x 3.  SKIN: No obvious rash, lesion, or ulcer.    LABORATORY PANEL:   CBC Recent Labs  Lab 09/20/18 1614  WBC 7.4  HGB 10.2*  HCT 33.3*  PLT 273   ------------------------------------------------------------------------------------------------------------------  Chemistries  Recent Labs  Lab 09/21/18 0219 09/22/18 0404  NA  --  138  K  --  4.1  CL  --  99  CO2  --  33*  GLUCOSE  --  138*  BUN  --  36*  CREATININE  --  1.66*  CALCIUM  --  8.5*  MG 1.3*  --   AST 24  --   ALT 32  --   ALKPHOS 53  --   BILITOT 0.8  --    ------------------------------------------------------------------------------------------------------------------  Cardiac Enzymes Recent Labs  Lab 09/21/18 1400  TROPONINI <0.03   ------------------------------------------------------------------------------------------------------------------  RADIOLOGY:  Dg Chest 2 View  Result Date: 09/20/2018 CLINICAL DATA:  Four week history of significant weight gain, orthopnea, shortness of breath, and lower extremity edema. History of cardiomyopathy, chronic CHF, chronic renal insufficiency stage III. EXAM: CHEST - 2 VIEW COMPARISON:  PA and lateral chest x-ray of September 13, 2017 FINDINGS: The lungs remain hyperinflated. The interstitial markings are coarse and slightly more conspicuous  today. The pulmonary vascularity remains engorged and is more prominent overall. The cardiac silhouette is enlarged. The ICD is in stable position. There is calcification in the wall of the thoracic aorta. There is no significant  pleural effusion. The bony thorax is unremarkable. IMPRESSION: CHF with mild interstitial edema which has worsened since the previous study. No alveolar pneumonia nor large pleural effusion. Thoracic aortic atherosclerosis. Electronically Signed   By: David  Martinique M.D.   On: 09/20/2018 16:52     ASSESSMENT AND PLAN:   73 year old female with past medical history of chronic diastolic CHF, diabetes, hypertension, nonischemic cardiomyopathy CKD stage III who presented to the hospital due to shortness of breath, lower extreme edema.  1.  Acute respiratory failure with hypoxia-secondary to decompensated CHF. -Continue O2 supplementation.  Continue diuresis with IV Lasix for the CHF and pt is improving. Wean o2 as tolerated.   2.  CHF-acute on chronic diastolic dysfunction. -Patient apparently had stopped her diuretics 2 weeks prior to admission. -Improving with IV diuresis, patient is about 5 L negative since admission.  Appreciate cardiology input.  Creatinine slightly rising with diuresis but will continue to monitor. -Continue carvedilol, hydralazine, Imdur -Appreciate cardiology consult.  Await repeat echocardiogram results.  3.  Diabetes type 2 without complication- continue Levemir, sliding scale insulin.  Follow blood sugars.  4.  Hypomagnesemia- replaced yesterday and will repeat level in a.m. tomorrow.   5.  Essential hypertension-continue Imdur, hydralazine, carvedilol, Clonidine. - BP stable.   6. Hyperlipidemia - cont. Atorvastatin.   7. GERD - cont. Protonix.   Ambulate and possible d/c in 1-2 days. Will get PT eval.   All the records are reviewed and case discussed with Care Management/Social Worker. Management plans discussed with the patient, family and they are in agreement.  CODE STATUS: Full code  DVT Prophylaxis: Lovenox  TOTAL TIME TAKING CARE OF THIS PATIENT: 30 minutes.   POSSIBLE D/C IN 1-2 DAYS, DEPENDING ON CLINICAL CONDITION.   Henreitta Leber M.D on  09/22/2018 at 3:54 PM  Between 7am to 6pm - Pager - [phone]  After 6pm go to [email or web address] - Proofreader  [facility]  Office  [phone]  CC: Primary care physician; Idelle Crouch, MD

## 2018-09-22 NOTE — Progress Notes (Deleted)
Pt discharged to home via wc.  Instructions and rx given to pt.  Questions answered.  No distress.  

## 2018-09-23 LAB — GLUCOSE, CAPILLARY
Glucose-Capillary: 138 mg/dL — ABNORMAL HIGH (ref 70–99)
Glucose-Capillary: 177 mg/dL — ABNORMAL HIGH (ref 70–99)

## 2018-09-23 LAB — BASIC METABOLIC PANEL
Anion gap: 8 (ref 5–15)
BUN: 38 mg/dL — ABNORMAL HIGH (ref 8–23)
CO2: 31 mmol/L (ref 22–32)
Calcium: 8.5 mg/dL — ABNORMAL LOW (ref 8.9–10.3)
Chloride: 96 mmol/L — ABNORMAL LOW (ref 98–111)
Creatinine, Ser: 1.54 mg/dL — ABNORMAL HIGH (ref 0.44–1.00)
GFR calc Af Amer: 38 mL/min — ABNORMAL LOW (ref >60)
GFR calc non Af Amer: 33 mL/min — ABNORMAL LOW (ref >60)
Glucose, Bld: 116 mg/dL — ABNORMAL HIGH (ref 70–99)
Potassium: 3.8 mmol/L (ref 3.5–5.1)
Sodium: 135 mmol/L (ref 135–145)

## 2018-09-23 MED ORDER — POTASSIUM CHLORIDE ER 20 MEQ PO TBCR: 20.0000 meq | EXTENDED_RELEASE_TABLET | Freq: Every day | ORAL | 1 refills | Status: DC

## 2018-09-23 MED ORDER — FUROSEMIDE 40 MG PO TABS: 60.0000 mg | ORAL_TABLET | Freq: Every day | ORAL | 1 refills | Status: DC

## 2018-09-23 NOTE — Plan of Care (Signed)
Nutrition Education Note  RD consulted for nutrition education regarding CHF.  Met with patient at bedside. She reports she has learned about a low sodium diet before, but is concerned she is eating foods that are high in salt and she does not realize it. We reviewed her usual intake. She does eat certain products high in salt such as processed breakfast meat, deli meat, canned vegetables (she does choose frozen for certain vegetables), and restaurant food/take-out. We discussed lower sodium alternatives. Patient not currently on a fluid restriction at home but is ordered for 1.2 L fluid restriction here. She has a scale but does not weigh herself daily. Encouraged patient to weigh every AM and write weight down on a notepad by scale so she can see when she has quick changes in weight, which will be concerning for fluid retention.  RD provided "Heart Failure Nutrition Therapy" handout from the [employer]. Reviewed patient's dietary recall. Provided examples on ways to decrease sodium intake in diet. Discouraged intake of processed foods and use of salt shaker. Encouraged fresh fruits and vegetables as well as whole grain sources of carbohydrates to maximize fiber intake.   RD discussed why it is important for patient to adhere to diet recommendations, and emphasized the role of fluids, foods to avoid, and importance of weighing self daily. Teach back method used.  Expect fair to good compliance.  Body mass index is 40.32 kg/m. Pt meets criteria for obesity class III based on current BMI.  Current diet order is renal-carbohydrate modified with 1.2 L fluid restriction, patient is consuming approximately 100% of meals at this time. Labs and medications reviewed. No further nutrition interventions warranted at this time. RD contact information provided. If additional nutrition issues arise, please re-consult RD.   Willey Blade, MS, [place], LDN Office: [phone] Pager:  [phone] After Hours/Weekend Pager: [phone]

## 2018-09-23 NOTE — Progress Notes (Signed)
Pt to be discharged today.iv and tele removed. disch instructions and prescrips given to pt to her understanding. disch via w.c. husbnad to transport home.

## 2018-09-23 NOTE — Discharge Summary (Signed)
[facility] [place] NAME: Teresa Mercado    MR#:  [id number]  DATE OF BIRTH:  May 04, 1946  DATE OF ADMISSION:  09/20/2018 ADMITTING PHYSICIAN: Arta Silence, MD  DATE OF DISCHARGE: 09/23/2018  PRIMARY CARE PHYSICIAN: Idelle Crouch, MD    ADMISSION DIAGNOSIS:  SOB (shortness of breath) [R06.02] Hypoxia [R09.02] Congestive heart failure, unspecified HF chronicity, unspecified heart failure type ([place]) [I50.9]  DISCHARGE DIAGNOSIS:  Active Problems:   Acute on chronic heart failure ([place])   SECONDARY DIAGNOSIS:   Past Medical History:  Diagnosis Date  . Chronic combined systolic (congestive) and diastolic (congestive) heart failure ([place])    a. 09/2015 Echo: normal EF 55%; b. 07/2017 Echo: EF 25%, Gr2 DD, mild lvh, mid ant/inf/infsept/apical AK. Triv MR. mildly to mod dil LA.  . CKD (chronic kidney disease), stage III ([place])   . COPD (chronic obstructive pulmonary disease) ([facility])    Not on home o2  . Diabetes mellitus without complication ([place])    On Levemir  . Non-ischemic cardiomyopathy ([place])    a. 07/2017 Echo: EF 25%, Gr2 DD - ? takotsubo.  . Non-obstructive CAD (coronary artery disease)    a. 07/2017 Cath: LM nl, LAD 40ost, D1 50, D2 40, LCX 55m, OM1/2 nl, RCA large, nl.  . Syncope    a.  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    HOSPITAL COURSE:   73 year old female with past medical history of chronic diastolic CHF, diabetes, hypertension, nonischemic cardiomyopathy CKD stage III who presented to the hospital due to shortness of breath, lower extreme edema.  1.  Acute respiratory failure with hypoxia-secondary to decompensated CHF. Patient was diuresed with IV Lasix and has improved.  She has been weaned off oxygen.  She was ambulated and is no longer hypoxic.  She is being discharged home.  2.  CHF-acute on chronic diastolic dysfunction. -Patient apparently had stopped her diuretics 2 weeks prior  to admission. -Patient was admitted started on IV diuresis with Lasix.  She is about 7 L negative since admission and clinically feels better.  She was ambulated and does not complain of any shortness of breath and is not hypoxic. -She will be discharged on oral Lasix and continue her carvedilol, hydralazine, Imdur.  She will also continue some low-dose potassium supplements.  She will follow-up with cardiology within a week. -Her echocardiogram while in the hospital showed normal ejection fraction with no wall motion abnormalities.  3.  Diabetes type 2 without complication-  continue her Levemir and her NovoLog with meals.  Given her CKD she was advised to stop her metformin, and also due to her heart failure she was told to stop Actos. -Further changes to her meds can be done as an outpatient.  4.  Hypomagnesemia- replaced and resolved now.    5.  Essential hypertension- pt. Will continue Imdur, hydralazine, carvedilol, Clonidine  6. Hyperlipidemia - pt. Will cont. Atorvastatin.   7. GERD - pt. Will cont. Her Nexium.   DISCHARGE CONDITIONS:   Stable.   CONSULTS OBTAINED:  Treatment Team:  Arta Silence, MD Minna Merritts, MD  DRUG ALLERGIES:   Allergies  Allergen Reactions  . Quinapril Other (See Comments)    Reaction:  Elevated potassium   . Venlafaxine Other (See Comments)    Reaction:  GI upset   . Zetia [Ezetimibe] Nausea And Vomiting    DISCHARGE MEDICATIONS:   Allergies as of 09/23/2018  Reactions   Quinapril Other (See Comments)   Reaction:  Elevated potassium    Venlafaxine Other (See Comments)   Reaction:  GI upset    Zetia [ezetimibe] Nausea And Vomiting      Medication List    STOP taking these medications   metFORMIN 1000 MG tablet Commonly known as:  GLUCOPHAGE   pioglitazone 45 MG tablet Commonly known as:  ACTOS     TAKE these medications   albuterol 108 (90 Base) MCG/ACT inhaler Commonly known as:  PROVENTIL HFA;VENTOLIN  HFA Inhale 1-2 puffs into the lungs every 6 (six) hours as needed for wheezing or shortness of breath.   aspirin 81 MG EC tablet Take 81 mg by mouth daily. Swallow whole.   atorvastatin 80 MG tablet Commonly known as:  LIPITOR Take 1 tablet (80 mg total) by mouth daily.   carvedilol 3.125 MG tablet Commonly known as:  COREG Take 1 tablet (3.125 mg total) by mouth 2 (two) times daily with a meal.   cloNIDine 0.1 MG tablet Commonly known as:  CATAPRES Take 0.5 tablets (0.05 mg total) by mouth 2 (two) times daily. 1/2 tablet twice daily as needed for high blood perssure   esomeprazole 40 MG capsule Commonly known as:  NEXIUM Take 1 capsule (40 mg total) by mouth daily.   ferrous sulfate 325 (65 FE) MG tablet Take 325 mg by mouth daily with breakfast.   furosemide 40 MG tablet Commonly known as:  LASIX Take 1.5 tablets (60 mg total) by mouth daily.   hydrALAZINE 25 MG tablet Commonly known as:  APRESOLINE Take 1 tablet (25 mg total) by mouth 3 (three) times daily.   insulin aspart 100 UNIT/ML FlexPen Commonly known as:  NOVOLOG Inject 16 Units into the skin 2 (two) times daily.   insulin detemir 100 UNIT/ML injection Commonly known as:  LEVEMIR Inject 25 Units into the skin at bedtime.   isosorbide mononitrate 30 MG 24 hr tablet Commonly known as:  IMDUR Take 1 tablet (30 mg total) by mouth daily.   LUTEIN 20 Caps Take 1 capsule by mouth daily.   Potassium Chloride ER 20 MEQ Tbcr Take 20 mEq by mouth daily.   vitamin B-12 1000 MCG tablet Commonly known as:  CYANOCOBALAMIN Take 1,000 mcg by mouth daily.         DISCHARGE INSTRUCTIONS:   DIET:  Cardiac diet and Diabetic diet  DISCHARGE CONDITION:  Stable  ACTIVITY:  Activity as tolerated  OXYGEN:  Home Oxygen: No.   Oxygen Delivery: room air  DISCHARGE LOCATION:  Home with Home Health PT, RN.    If you experience worsening of your admission symptoms, develop shortness of breath, life  threatening emergency, suicidal or homicidal thoughts you must seek medical attention immediately by calling 911 or calling your MD immediately  if symptoms less severe.  You Must read complete instructions/literature along with all the possible adverse reactions/side effects for all the Medicines you take and that have been prescribed to you. Take any new Medicines after you have completely understood and accpet all the possible adverse reactions/side effects.   Please note  You were cared for by a hospitalist during your hospital stay. If you have any questions about your discharge medications or the care you received while you were in the hospital after you are discharged, you can call the unit and asked to speak with the hospitalist on call if the hospitalist that took care of you is not available. Once you are discharged,  your primary care physician will handle any further medical issues. Please note that NO REFILLS for any discharge medications will be authorized once you are discharged, as it is imperative that you return to your primary care physician (or establish a relationship with a primary care physician if you do not have one) for your aftercare needs so that they can reassess your need for medications and monitor your lab values.     Today   Shortness of breath, lower extreme edema improved.  Patient ambulated and is no longer hypoxic.  She overall feels much better.  We will discharge her home today with home health services.  Patient is in agreement.  VITAL SIGNS:  Blood pressure (!) 113/47, pulse 76, temperature 98.4 F (36.9 C), temperature source Oral, resp. rate 18, height 5\' 3"  (1.6 m), weight 103.2 kg, SpO2 98 %.  I/O:    Intake/Output Summary (Last 24 hours) at 09/23/2018 1352 Last data filed at 09/23/2018 1350 Gross per 24 hour  Intake 720 ml  Output 3300 ml  Net -2580 ml    PHYSICAL EXAMINATION:   GENERAL:  73 y.o.-year-old obese patient lying in bed in no acute  distress.  EYES: Pupils equal, round, reactive to light and accommodation. No scleral icterus. Extraocular muscles intact.  HEENT: Head atraumatic, normocephalic. Oropharynx and nasopharynx clear.  NECK:  Supple, + jugular venous distention. No thyroid enlargement, no tenderness.  LUNGS: Normal breath sounds bilaterally, no wheezing, basilar rales,no rhonchi. No use of accessory muscles of respiration.  CARDIOVASCULAR: S1, S2 normal. No murmurs, rubs, or gallops.  ABDOMEN: Soft, nontender, nondistended. Bowel sounds present. No organomegaly or mass.  EXTREMITIES: No cyanosis, clubbing, +1 edema from knees to ankles b/l.  NEUROLOGIC: Cranial nerves II through XII are intact. No focal Motor or sensory deficits b/l. PSYCHIATRIC: The patient is alert and oriented x 3.  SKIN: No obvious rash, lesion, or ulcer.   DATA REVIEW:   CBC Recent Labs  Lab 09/20/18 1614  WBC 7.4  HGB 10.2*  HCT 33.3*  PLT 273    Chemistries  Recent Labs  Lab 09/21/18 0219  09/23/18 0719  NA  --    < > 135  K  --    < > 3.8  CL  --    < > 96*  CO2  --    < > 31  GLUCOSE  --    < > 116*  BUN  --    < > 38*  CREATININE  --    < > 1.54*  CALCIUM  --    < > 8.5*  MG 1.3*  --   --   AST 24  --   --   ALT 32  --   --   ALKPHOS 53  --   --   BILITOT 0.8  --   --    < > = values in this interval not displayed.    Cardiac Enzymes Recent Labs  Lab 09/21/18 1400  TROPONINI <0.03    Microbiology Results  No results found for this or any previous visit.  RADIOLOGY:  No results found.    Management plans discussed with the patient, family and they are in agreement.  CODE STATUS:     Code Status Orders  (From admission, onward)         Start     Ordered   09/21/18 0317  Full code  Continuous     09/21/18 0316  TOTAL TIME TAKING CARE OF THIS PATIENT: 45 minutes.    Henreitta Leber M.D on 09/23/2018 at 1:52 PM  Between 7am to 6pm - Pager - [phone]  After 6pm go to  [email or web address] - Proofreader  [facility]  Office  [phone]  CC: Primary care physician; Idelle Crouch, MD

## 2018-09-23 NOTE — Care Management Note (Signed)
Case Management Note  Patient Details  Name: Teresa Mercado MRN: [id number] Date of Birth: 17-May-1946  Subjective/Objective:  Patient to be discharged per MD order. Orders in place for home health services. Patient lives with spouse. Patient is typically independent at baseline but does think she would benefit from home health. CMS Medicare.gov Compare Post Acute Care list reviewed with patient and patient has no preference. Referral placed with [place] from [facility]. No DME needs. Family to transport.                     Action/Plan:   Expected Discharge Date:  09/23/18               Expected Discharge Plan:  [place]  In-House Referral:     Discharge planning Services  CM Consult  Post Acute Care Choice:  Home Health Choice offered to:  Patient  DME Arranged:    DME Agency:     HH Arranged:  RN, PT [place] Agency:  [facility]  Status of Service:  Completed, signed off  If discussed at [place] of Stay Meetings, dates discussed:    Additional Comments:  Latanya Maudlin, RN 09/23/2018, 12:49 PM

## 2018-09-23 NOTE — Progress Notes (Signed)
Progress Note  Patient Name: Teresa Mercado Date of Encounter: 09/23/2018  Primary Cardiologist: Ida Rogue, MD   Subjective   No chest pain. Dyspnea back to normal.  Inpatient Medications    Scheduled Meds: . aspirin  81 mg Oral Daily  . atorvastatin  80 mg Oral Daily  . carvedilol  3.125 mg Oral BID WC  . cloNIDine  0.05 mg Oral BID  . enoxaparin (LOVENOX) injection  40 mg Subcutaneous Q24H  . ferrous sulfate  325 mg Oral Q breakfast  . hydrALAZINE  25 mg Oral TID  . Influenza vac split quadrivalent PF  0.5 mL Intramuscular Tomorrow-1000  . insulin aspart  0-15 Units Subcutaneous TID WC  . insulin aspart  0-5 Units Subcutaneous QHS  . insulin detemir  20 Units Subcutaneous QHS  . isosorbide mononitrate  30 mg Oral Daily  . pantoprazole  40 mg Oral Daily  . sodium chloride flush  3 mL Intravenous Q12H   Continuous Infusions: . sodium chloride     PRN Meds: sodium chloride, acetaminophen, albuterol, bisacodyl, cyclobenzaprine, ondansetron (ZOFRAN) IV, senna-docusate, sodium chloride flush   Vital Signs    Vitals:   09/23/18 0327 09/23/18 0329 09/23/18 0331 09/23/18 0358  BP: (!) 106/34 (!) 116/42  (!) 113/47  Pulse: 76 79  76  Resp:    18  Temp: 98.1 F (36.7 C)   98.4 F (36.9 C)  TempSrc: Oral   Oral  SpO2: 96%   98%  Weight:   103.2 kg   Height:        Intake/Output Summary (Last 24 hours) at 09/23/2018 1234 Last data filed at 09/23/2018 1115 Gross per 24 hour  Intake 600 ml  Output 3300 ml  Net -2700 ml   Filed Weights   09/21/18 1617 09/22/18 0440 09/23/18 0331  Weight: 105.2 kg 103.2 kg 103.2 kg    Telemetry    NSR - Personally Reviewed  ECG    none - Personally Reviewed  Physical Exam   GEN: No acute distress.   Neck: No JVD Cardiac: RRR, no murmurs, rubs, or gallops.  Respiratory: Clear to auscultation bilaterally. GI: Soft, nontender, non-distended  MS: trace peripheral edema; No deformity. Neuro:  Nonfocal  Psych: Normal affect     Labs    Chemistry Recent Labs  Lab 09/20/18 1614 09/21/18 0219 09/22/18 0404 09/23/18 0719  NA 137  --  138 135  K 5.2*  --  4.1 3.8  CL 104  --  99 96*  CO2 28  --  33* 31  GLUCOSE 131*  --  138* 116*  BUN 27*  --  36* 38*  CREATININE 1.42*  --  1.66* 1.54*  CALCIUM 9.2  --  8.5* 8.5*  PROT  --  6.8  --   --   ALBUMIN  --  3.3*  --   --   AST  --  24  --   --   ALT  --  32  --   --   ALKPHOS  --  53  --   --   BILITOT  --  0.8  --   --   GFRNONAA 37*  --  30* 33*  GFRAA 42*  --  35* 38*  ANIONGAP 5  --  6 8     Hematology Recent Labs  Lab 09/20/18 1614  WBC 7.4  RBC 3.56*  HGB 10.2*  HCT 33.3*  MCV 93.5  MCH 28.7  MCHC 30.6  RDW  14.5  PLT 273    Cardiac Enzymes Recent Labs  Lab 09/20/18 1614 09/21/18 0219 09/21/18 0533 09/21/18 1400  TROPONINI <0.03 0.05* 0.06* <0.03   No results for input(s): TROPIPOC in the last 168 hours.   BNP Recent Labs  Lab 09/20/18 1614  BNP 326.0*     DDimer No results for input(s): DDIMER in the last 168 hours.   Radiology    No results found.  Cardiac Studies   none  Patient Profile     73 y.o. female admitted with acute on chronic systolic/diastolic heart failure.  Assessment & Plan    1. Acute on chronic systolic/diastolic heart failure - she appears to euvolemic today and creatinine is ok. She will be discharged on lasix 60 mg daily. She will followup with Dr. Rockey Situ for medication adjustment.  2. PPM - normal device function.  3. Chronic staged 2 renal insufficiency - her creatinine is stable.     For questions or updates, please contact [place] Please consult [email or web address] for contact info under Cardiology/STEMI.      Signed, Cristopher Peru, MD  09/23/2018, 12:34 PM  Patient ID: [name], female   DOB: 09-Aug-1946, 73 y.o.   MRN: [id number]

## 2018-09-27 ENCOUNTER — Ambulatory Visit: Payer: Medicare Other | Admitting: Family

## 2018-09-28 ENCOUNTER — Telehealth: Payer: Self-pay | Admitting: Cardiovascular Disease

## 2018-09-28 NOTE — Telephone Encounter (Signed)
I called and spoke with Lattie Haw with [facility]. I gave a verbal ok for her to go to the patient's home for her CHF education/ medication management.

## 2018-09-28 NOTE — Telephone Encounter (Signed)
Nurse needs verbal order for her to go to pt home for disease management education and medication management.  Ok to leave a verbal order on voicemail

## 2018-09-30 DIAGNOSIS — I428 Other cardiomyopathies: Secondary | ICD-10-CM | POA: Insufficient documentation

## 2018-09-30 DIAGNOSIS — I5043 Acute on chronic combined systolic (congestive) and diastolic (congestive) heart failure: Secondary | ICD-10-CM | POA: Insufficient documentation

## 2018-09-30 DIAGNOSIS — I10 Essential (primary) hypertension: Secondary | ICD-10-CM | POA: Insufficient documentation

## 2018-09-30 NOTE — Progress Notes (Signed)
Cardiology Office Note  Date:  10/01/2018   ID:  Teresa Mercado, DOB 02-03-1946, MRN [id number]  PCP:  Idelle Crouch, MD   Chief Complaint  Patient presents with  . Other    Patient c/o swelling in feet and ankles. Meds reviewed verbally with patient.     HPI:  Teresa Mercado is a 73 y.o. female with a known history of  diastolic CHF,  COPD,  diabetes mellitus,  CKD stage III with baseline creatinine around 1.6 to 1.8,  complete heart block with a pacemaker (initially presenting with syncope),   GI bleed, hematocrit 6.9 requiring transfusion 2  Who presents today for routine follow-up of her chronic diastolic CHF,  Previous hospital  Admission November 2018  For chest pain, elevated toponin,  Nonischemic cardiomyopathy, catheterization with mild to moderate nonobstructive disease She presents today for follow-up of her cardiomyopathy  presented to the hospital September 20, 2018 from clinic  with worsening lower extremity edema, shortness of breath Hospital records reviewed with the patient in detail Diuresis in the hospital, 7 liters negative  Since discharge reports weight has been ranging from 220 up to 225 pounds  On the high end of the range today  has more lower extremity edema and abdominal fullness today Reports that she ate poorly yesterday, high salt Lasix was decreased by Dr. Doy Hutching from 60 down to 40 daily  She is trying to change her diet and limit her fluid intake Previously reports drinking a lot of fluid Was drinking more when she took higher dose Lasix for some reason  Other records reviewed seen by Dr. Doy Hutching August 31, 2018, had reported some abdominal fullness at that time.  Per her account periodically has been taking extra Lasix Lab work showed creatinine 2.1 BUN 62 She reports that she was told by nurses to stop all Lasix She was told to drink lots of fluids Repeat lab work September 10, 2018 creatinine 1.4 BUN 28 Through the holidays, she developed  worsening lower extremity edema to above her knees, past several days with associated shortness of breath getting worse and presented to the hospital  Echo: In the hospital Wall thickness was increased in a pattern of severe LVH. Systolic   function was normal. The estimated ejection fraction was in the   range of 55% to 60%.  EKG personally reviewed by myself on todays visit Shows paced rhythm rate 80 bpm  Labs reviewed: HBA1C 7.1  Other past medical history reviewed  hospital November 2018  Echocardiogram with ejection fraction 25% Presented with chest pain chronic diastolic CHF, poor control diabetes, chronic kidney disease Climb in troponin up to 2.66 Felt to have stress cardiomyopathy ,  Cardiac cath 07/21/2017 1. 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 2. Mildly elevated left ventricular filling pressure.  1. Degree of cardiomyopathy (LVEF 25% by echo) is out of proportion to CAD. 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 Start low-dose carvedilol. 3. Medical therapy and risk factor modification to prevent progression of CAD. 4. Consider gentle diuresis tomorrow, as renal function allows. Will hold off on diuresis tonight given CKD and contrast exposure.  Pacer followed by Dr. Caryl Comes   Echocardiogram January 2017 Ejection fraction greater than 55%, No significant valve disease  Previous EGD showing gastritis, erosions . 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  PSH:    Past Surgical History:   Procedure Laterality Date  . APPENDECTOMY    . ESOPHAGOGASTRODUODENOSCOPY N/A 11/27/2015   Procedure: ESOPHAGOGASTRODUODENOSCOPY (EGD);  Surgeon: Lollie Sails, MD;  [facility]: [facility] END[facility]COPY;  Service: Endoscopy;  Laterality: N/A;  . LEFT HEART CATH AND CORONARY ANGIOGRAPHY N/A 07/21/2017   Procedure: LEFT HEART CATH AND CORONARY ANGIOGRAPHY;  Surgeon: [name], MD;  Location: [place] CV LAB;  Service: Cardiovascular;  Laterality: N/A;  . PACEMAKER INSERTION    . TONSILLECTOMY    . TUBAL LIGATION      Current Outpatient Medications  Medication Sig Dispense Refill  . albuterol (PROVENTIL HFA;VENTOLIN HFA) 108 (90 Base) MCG/ACT inhaler Inhale 1-2 puffs into the lungs every 6 (six) hours as needed for wheezing or shortness of breath.    Marland Kitchen aspirin 81 MG EC tablet Take 81 mg by mouth daily. Swallow whole.    Marland Kitchen atorvastatin (LIPITOR) 80 MG tablet Take 1 tablet (80 mg total) by mouth daily. 90 tablet 4  . carvedilol (COREG) 3.125 MG tablet Take 1 tablet (3.125 mg total) by mouth 2 (two) times daily with a meal. 180 tablet 4  . esomeprazole (NEXIUM) 40 MG capsule Take 1 capsule (40 mg total) by mouth daily. 90 capsule 3  . ferrous sulfate 325 (65 FE) MG tablet Take 325 mg by mouth daily with breakfast.    . furosemide (LASIX) 40 MG tablet Take 1 tablet (40 mg) once daily. If your  weight is > 224 lbs, take 1 tablet (40 mg) twice daily    . hydrALAZINE (APRESOLINE) 25 MG tablet Take 1 tablet (25 mg total) by mouth 3 (three) times daily. 270 tablet 3  . insulin aspart (NOVOLOG) 100 UNIT/ML FlexPen Inject 16 Units into the skin 2 (two) times daily.     . insulin detemir (LEVEMIR) 100 UNIT/ML injection Inject 25 Units into the skin at bedtime.     . isosorbide mononitrate (IMDUR) 30 MG 24 hr tablet Take 1 tablet (30 mg total) by mouth daily. 90 tablet 3  . lisinopril (PRINIVIL,ZESTRIL) 10 MG tablet Take 10 mg by mouth daily.    . Misc Natural Products (LUTEIN 20) CAPS Take 1 capsule by  mouth daily.    . vitamin B-12 (CYANOCOBALAMIN) 1000 MCG tablet Take 1,000 mcg by mouth daily.     No current facility-administered medications for this visit.      Allergies:   Quinapril; Venlafaxine; and Zetia [ezetimibe]   Social History:  The patient  reports that she quit smoking about 12 years ago. Her smoking use included cigarettes. She smoked 2.00 packs per day. She has never used smokeless tobacco. She reports that she does not drink alcohol or use drugs.   Family History:   family history includes Esophageal cancer in her brother; Kidney cancer in her father.    Review of Systems: Review of Systems  Constitutional: Negative.   Respiratory: Positive for shortness of breath.   Cardiovascular: Positive for leg swelling.  Gastrointestinal: Negative.        Abdominal fullness  Musculoskeletal: Negative.        Gait instability  Neurological: Negative.   Psychiatric/Behavioral: Negative.   All other systems reviewed and are negative.    PHYSICAL EXAM: VS:  BP 130/70 (BP Location:  Left Arm, Patient Position: Sitting, Cuff Size: Large)   Pulse 80   Ht 5\' 3"  (1.6 m)   Wt 227 lb (103 kg)   BMI 40.21 kg/m  , BMI Body mass index is 40.21 kg/m. Constitutional:  oriented to person, place, and time. No distress.  Obese HENT:  Head: Grossly normal Eyes:  no discharge. No scleral icterus.  Neck: No JVD, no carotid bruits  Cardiovascular: Regular rate and rhythm, no murmurs appreciated Trace to 1+ lower extremity edema to below the knees bilaterally Pulmonary/Chest: Clear to auscultation bilaterally, no wheezes or rails Abdominal: Soft.  no distension.  no tenderness.  Musculoskeletal: Normal range of motion Neurological:  normal muscle tone. Coordination normal. No atrophy Skin: Skin warm and dry Psychiatric: normal affect, pleasant   Recent Labs: 09/20/2018: B Natriuretic Peptide 326.0; Hemoglobin 10.2; Platelets 273 09/21/2018: ALT 32; Magnesium 1.3 09/23/2018: BUN 38;  Creatinine, Ser 1.54; Potassium 3.8; Sodium 135    Lipid Panel No results found for: CHOL, HDL, LDLCALC, TRIG    Wt Readings from Last 3 Encounters:  10/01/18 227 lb (103 kg)  09/23/18 227 lb 9.6 oz (103.2 kg)  09/20/18 240 lb (108.9 kg)       ASSESSMENT AND PLAN:   Chronic systolic and diastolic CHF (congestive heart failure) (HCC) -  Suggested she take Lasix 40 daily Goal weight 220 up to 221 pounds If weight goes 224 or higher with increase Lasix up to 40 twice daily for 1 day then we weigh the next day Weight today is 224 pounds and she has leg swelling and abdominal bloating consistent with fluid retention -Poor diet, still with high fluid intake  Morbid obesity due to excess calories (HCC)  Discussed low carbohydrate diet Eating the wrong foods, likes to go out to restaurants  Shortness of breath Chronic shortness of breath, deconditioning, morbid obesity Lasix as above  Poorly controlled type 2 diabetes mellitus ([place])  working with Dr. Doy Hutching and endocrinology Poor diet  Nonischemic cardiomyopathy Recent echocardiogram normal ejection fraction  Complete heart block ([place]) Pacemaker, followed by Dr. Caryl Comes Paced rhythm today Ventricularly paced rhythm may be contributing to some of her heart failure symptoms  Chronic renal failure  We will try to avoid overdiuresis  Essential hypertension Blood pressure stable No changes made   Total encounter time more than 25 minutes  Greater than 50% was spent in counseling and coordination of care with the patient   Disposition:   F/U 12 months   Orders Placed This Encounter  Procedures  . EKG 12-Lead     Signed, Esmond Plants, M.D., Ph.D. 10/01/2018  [place], [place]

## 2018-10-01 ENCOUNTER — Encounter: Payer: Self-pay | Admitting: Cardiovascular Disease

## 2018-10-01 ENCOUNTER — Ambulatory Visit (INDEPENDENT_AMBULATORY_CARE_PROVIDER_SITE_OTHER): Payer: Medicare Other | Admitting: Cardiovascular Disease

## 2018-10-01 VITALS — BP 130/70 | HR 80 | Ht 63.0 in | Wt 227.0 lb

## 2018-10-01 DIAGNOSIS — I5043 Acute on chronic combined systolic (congestive) and diastolic (congestive) heart failure: Secondary | ICD-10-CM

## 2018-10-01 DIAGNOSIS — Z950 Presence of cardiac pacemaker: Secondary | ICD-10-CM

## 2018-10-01 DIAGNOSIS — I10 Essential (primary) hypertension: Secondary | ICD-10-CM

## 2018-10-01 DIAGNOSIS — I428 Other cardiomyopathies: Secondary | ICD-10-CM

## 2018-10-01 DIAGNOSIS — I442 Atrioventricular block, complete: Secondary | ICD-10-CM

## 2018-10-01 DIAGNOSIS — I2510 Atherosclerotic heart disease of native coronary artery without angina pectoris: Secondary | ICD-10-CM

## 2018-10-01 NOTE — Patient Instructions (Addendum)
Medication Instructions:   -Continue lasix 40 mg once daily  -Please take lasix 40 mg twice a day for weight >224 Take for one day then check weight the next day  If you need a refill on your cardiac medications before your next appointment, please call your pharmacy.    Lab work: No new labs needed   If you have labs (blood work) drawn today and your tests are completely normal, you will receive your results only by: Marland Kitchen MyChart Message (if you have MyChart) OR . A paper copy in the mail If you have any lab test that is abnormal or we need to change your treatment, we will call you to review the results.   Testing/Procedures: No new testing needed   Follow-Up: At [facility], you and your health needs are our priority.  As part of our continuing mission to provide you with exceptional heart care, we have created designated Provider Care Teams.  These Care Teams include your primary Cardiologist (physician) and Advanced Practice Providers (APPs -  Physician Assistants and Nurse Practitioners) who all work together to provide you with the care you need, when you need it.  . You will need a follow up appointment in 6 months .   Please call our office 2 months in advance to schedule this appointment.    . Providers on your designated Care Team:   . Murray Hodgkins, NP . Christell Faith, PA-C . Marrianne Mood, PA-C  Any Other Special Instructions Will Be Listed Below (If Applicable).  For educational health videos Log in to : [email or web address] Or : [email or web address], password : [email or web address]

## 2018-12-13 ENCOUNTER — Other Ambulatory Visit: Payer: Self-pay | Admitting: Internal Medicine

## 2018-12-13 DIAGNOSIS — S32040A Wedge compression fracture of fourth lumbar vertebra, initial encounter for closed fracture: Secondary | ICD-10-CM

## 2018-12-19 ENCOUNTER — Other Ambulatory Visit: Payer: Self-pay | Admitting: Internal Medicine

## 2018-12-19 DIAGNOSIS — S32040A Wedge compression fracture of fourth lumbar vertebra, initial encounter for closed fracture: Secondary | ICD-10-CM

## 2019-01-08 ENCOUNTER — Other Ambulatory Visit: Payer: Self-pay | Admitting: Orthopedic Surgery

## 2019-01-08 ENCOUNTER — Other Ambulatory Visit (HOSPITAL_COMMUNITY): Payer: Self-pay | Admitting: Orthopedic Surgery

## 2019-01-08 DIAGNOSIS — M25551 Pain in right hip: Secondary | ICD-10-CM

## 2019-01-08 DIAGNOSIS — M79604 Pain in right leg: Secondary | ICD-10-CM

## 2019-01-08 DIAGNOSIS — S32040A Wedge compression fracture of fourth lumbar vertebra, initial encounter for closed fracture: Secondary | ICD-10-CM

## 2019-01-11 ENCOUNTER — Ambulatory Visit: Admission: RE | Admit: 2019-01-11 | Payer: Medicare Other | Source: Ambulatory Visit

## 2019-01-16 ENCOUNTER — Ambulatory Visit (HOSPITAL_COMMUNITY): Payer: Medicare Other

## 2019-01-17 ENCOUNTER — Other Ambulatory Visit: Payer: Self-pay

## 2019-01-17 ENCOUNTER — Ambulatory Visit (HOSPITAL_COMMUNITY)
Admission: RE | Admit: 2019-01-17 | Discharge: 2019-01-17 | Disposition: A | Discharge status: Home / Self Care | Payer: Medicare Other | Source: Ambulatory Visit | Attending: Orthopedic Surgery | Admitting: Orthopedic Surgery

## 2019-01-17 DIAGNOSIS — M79604 Pain in right leg: Secondary | ICD-10-CM

## 2019-01-17 DIAGNOSIS — S32040A Wedge compression fracture of fourth lumbar vertebra, initial encounter for closed fracture: Secondary | ICD-10-CM | POA: Diagnosis not present

## 2019-01-17 DIAGNOSIS — M25551 Pain in right hip: Secondary | ICD-10-CM

## 2019-02-20 ENCOUNTER — Other Ambulatory Visit: Payer: Self-pay

## 2019-02-20 ENCOUNTER — Other Ambulatory Visit
Admission: RE | Admit: 2019-02-20 | Discharge: 2019-02-20 | Disposition: A | Discharge status: Home / Self Care | Payer: Medicare Other | Source: Ambulatory Visit | Attending: Orthopedic Surgery | Admitting: Orthopedic Surgery

## 2019-02-20 DIAGNOSIS — Z1159 Encounter for screening for other viral diseases: Secondary | ICD-10-CM | POA: Diagnosis present

## 2019-02-20 LAB — SARS CORONAVIRUS 2 BY RT PCR (HOSPITAL ORDER, PERFORMED IN ~~LOC~~ HOSPITAL LAB): SARS Coronavirus 2: NEGATIVE

## 2019-02-20 MED ORDER — CEFAZOLIN SODIUM-DEXTROSE 2-4 GM/100ML-% IV SOLN
2.0000 g | Freq: Once | INTRAVENOUS | Status: AC
  Administered 2019-02-21: 2 g via INTRAVENOUS

## 2019-02-21 ENCOUNTER — Ambulatory Visit
Admission: RE | Admit: 2019-02-21 | Discharge: 2019-02-21 | Disposition: A | Discharge status: Home / Self Care | Payer: Medicare Other | Attending: Orthopedic Surgery | Admitting: Orthopedic Surgery

## 2019-02-21 ENCOUNTER — Ambulatory Visit: Payer: Medicare Other

## 2019-02-21 ENCOUNTER — Ambulatory Visit: Payer: Medicare Other | Admitting: Anesthesiology

## 2019-02-21 ENCOUNTER — Encounter: Payer: Self-pay | Admitting: *Deleted

## 2019-02-21 ENCOUNTER — Telehealth: Payer: Self-pay | Admitting: Internal Medicine

## 2019-02-21 ENCOUNTER — Encounter
Admission: RE | Disposition: A | Discharge status: Home / Self Care | Payer: Self-pay | Source: Home / Self Care | Attending: Orthopedic Surgery

## 2019-02-21 ENCOUNTER — Other Ambulatory Visit: Payer: Self-pay

## 2019-02-21 DIAGNOSIS — Z7982 Long term (current) use of aspirin: Secondary | ICD-10-CM | POA: Insufficient documentation

## 2019-02-21 DIAGNOSIS — Z79899 Other long term (current) drug therapy: Secondary | ICD-10-CM | POA: Insufficient documentation

## 2019-02-21 DIAGNOSIS — E1122 Type 2 diabetes mellitus with diabetic chronic kidney disease: Secondary | ICD-10-CM | POA: Diagnosis not present

## 2019-02-21 DIAGNOSIS — Z950 Presence of cardiac pacemaker: Secondary | ICD-10-CM | POA: Insufficient documentation

## 2019-02-21 DIAGNOSIS — X58XXXA Exposure to other specified factors, initial encounter: Secondary | ICD-10-CM | POA: Insufficient documentation

## 2019-02-21 DIAGNOSIS — Z87891 Personal history of nicotine dependence: Secondary | ICD-10-CM | POA: Insufficient documentation

## 2019-02-21 DIAGNOSIS — I2510 Atherosclerotic heart disease of native coronary artery without angina pectoris: Secondary | ICD-10-CM | POA: Diagnosis not present

## 2019-02-21 DIAGNOSIS — S32040A Wedge compression fracture of fourth lumbar vertebra, initial encounter for closed fracture: Secondary | ICD-10-CM | POA: Insufficient documentation

## 2019-02-21 DIAGNOSIS — I5042 Chronic combined systolic (congestive) and diastolic (congestive) heart failure: Secondary | ICD-10-CM | POA: Diagnosis not present

## 2019-02-21 DIAGNOSIS — Z6841 Body Mass Index (BMI) 40.0 and over, adult: Secondary | ICD-10-CM | POA: Diagnosis not present

## 2019-02-21 DIAGNOSIS — Z794 Long term (current) use of insulin: Secondary | ICD-10-CM | POA: Diagnosis not present

## 2019-02-21 DIAGNOSIS — I428 Other cardiomyopathies: Secondary | ICD-10-CM | POA: Insufficient documentation

## 2019-02-21 DIAGNOSIS — I130 Hypertensive heart and chronic kidney disease with heart failure and stage 1 through stage 4 chronic kidney disease, or unspecified chronic kidney disease: Secondary | ICD-10-CM | POA: Diagnosis not present

## 2019-02-21 DIAGNOSIS — E1140 Type 2 diabetes mellitus with diabetic neuropathy, unspecified: Secondary | ICD-10-CM | POA: Insufficient documentation

## 2019-02-21 DIAGNOSIS — N184 Chronic kidney disease, stage 4 (severe): Secondary | ICD-10-CM | POA: Insufficient documentation

## 2019-02-21 DIAGNOSIS — Z419 Encounter for procedure for purposes other than remedying health state, unspecified: Secondary | ICD-10-CM

## 2019-02-21 DIAGNOSIS — J449 Chronic obstructive pulmonary disease, unspecified: Secondary | ICD-10-CM | POA: Diagnosis not present

## 2019-02-21 HISTORY — PX: KYPHOPLASTY: SHX5884

## 2019-02-21 LAB — GLUCOSE, CAPILLARY
Glucose-Capillary: 99 mg/dL (ref 70–99)
Glucose-Capillary: 160 mg/dL — ABNORMAL HIGH (ref 70–99)

## 2019-02-21 SURGERY — KYPHOPLASTY
Anesthesia: General | Site: Back

## 2019-02-21 MED ORDER — FENTANYL CITRATE (PF) 100 MCG/2ML IJ SOLN: 25.0000 ug | INTRAMUSCULAR | Status: DC | PRN

## 2019-02-21 MED ORDER — MORPHINE SULFATE (PF) 4 MG/ML IV SOLN: 0.5000 mg | INTRAVENOUS | Status: DC | PRN

## 2019-02-21 MED ORDER — MIDAZOLAM HCL 2 MG/2ML IJ SOLN
INTRAMUSCULAR | Status: AC
  Filled 2019-02-21: qty 2

## 2019-02-21 MED ORDER — IOPAMIDOL (ISOVUE-M 200) INJECTION 41%
INTRAMUSCULAR | Status: DC | PRN
  Administered 2019-02-21: 21:00:00 10 mL

## 2019-02-21 MED ORDER — LIDOCAINE HCL 1 % IJ SOLN
INTRAMUSCULAR | Status: DC | PRN
  Administered 2019-02-21: 10 mL via INTRADERMAL
  Administered 2019-02-21: 20 mL via INTRADERMAL

## 2019-02-21 MED ORDER — CEFAZOLIN SODIUM-DEXTROSE 2-4 GM/100ML-% IV SOLN
INTRAVENOUS | Status: AC
  Filled 2019-02-21: qty 100

## 2019-02-21 MED ORDER — FAMOTIDINE 20 MG PO TABS
20.0000 mg | ORAL_TABLET | Freq: Once | ORAL | Status: AC
  Administered 2019-02-21: 20 mg via ORAL

## 2019-02-21 MED ORDER — FAMOTIDINE 20 MG PO TABS
ORAL_TABLET | ORAL | Status: AC
  Administered 2019-02-21: 13:00:00 20 mg via ORAL
  Filled 2019-02-21: qty 1

## 2019-02-21 MED ORDER — SODIUM CHLORIDE 0.9 % IV SOLN: INTRAVENOUS | Status: DC

## 2019-02-21 MED ORDER — KETAMINE HCL 50 MG/ML IJ SOLN
INTRAMUSCULAR | Status: DC | PRN
  Administered 2019-02-21: 50 mg via INTRAMUSCULAR

## 2019-02-21 MED ORDER — SODIUM CHLORIDE 0.9 % IV SOLN
INTRAVENOUS | Status: DC | PRN
  Administered 2019-02-21: 21:00:00 via INTRAVENOUS

## 2019-02-21 MED ORDER — PROPOFOL 500 MG/50ML IV EMUL
INTRAVENOUS | Status: DC | PRN
  Administered 2019-02-21: 50 ug/kg/min via INTRAVENOUS

## 2019-02-21 MED ORDER — ONDANSETRON HCL 4 MG/2ML IJ SOLN
INTRAMUSCULAR | Status: AC
  Filled 2019-02-21: qty 2

## 2019-02-21 MED ORDER — BUPIVACAINE-EPINEPHRINE (PF) 0.5% -1:200000 IJ SOLN
INTRAMUSCULAR | Status: DC | PRN
  Administered 2019-02-21: 20 mL via PERINEURAL

## 2019-02-21 MED ORDER — ACETAMINOPHEN 500 MG PO TABS: 500.0000 mg | ORAL_TABLET | Freq: Four times a day (QID) | ORAL | Status: DC

## 2019-02-21 MED ORDER — ESMOLOL HCL 100 MG/10ML IV SOLN
INTRAVENOUS | Status: AC
  Filled 2019-02-21: qty 10

## 2019-02-21 MED ORDER — HYDROCODONE-ACETAMINOPHEN 7.5-325 MG PO TABS: 1.0000 | ORAL_TABLET | ORAL | Status: DC | PRN

## 2019-02-21 MED ORDER — ONDANSETRON HCL 4 MG/2ML IJ SOLN: 4.0000 mg | Freq: Four times a day (QID) | INTRAMUSCULAR | Status: DC | PRN

## 2019-02-21 MED ORDER — SODIUM CHLORIDE 0.9 % IV SOLN
Freq: Once | INTRAVENOUS | Status: AC
  Administered 2019-02-21: 13:00:00 via INTRAVENOUS

## 2019-02-21 MED ORDER — METOCLOPRAMIDE HCL 10 MG PO TABS: 5.0000 mg | ORAL_TABLET | Freq: Three times a day (TID) | ORAL | Status: DC | PRN

## 2019-02-21 MED ORDER — METOCLOPRAMIDE HCL 5 MG/ML IJ SOLN: 5.0000 mg | Freq: Three times a day (TID) | INTRAMUSCULAR | Status: DC | PRN

## 2019-02-21 MED ORDER — ACETAMINOPHEN 325 MG PO TABS: 325.0000 mg | ORAL_TABLET | Freq: Four times a day (QID) | ORAL | Status: DC | PRN

## 2019-02-21 MED ORDER — PROPOFOL 10 MG/ML IV BOLUS
INTRAVENOUS | Status: DC | PRN
  Administered 2019-02-21: 30 mg via INTRAVENOUS

## 2019-02-21 MED ORDER — ONDANSETRON HCL 4 MG/2ML IJ SOLN
INTRAMUSCULAR | Status: DC | PRN
  Administered 2019-02-21: 4 mg via INTRAVENOUS

## 2019-02-21 MED ORDER — HYDROCODONE-ACETAMINOPHEN 5-325 MG PO TABS: 1.0000 | ORAL_TABLET | ORAL | Status: DC | PRN

## 2019-02-21 MED ORDER — ONDANSETRON HCL 4 MG PO TABS: 4.0000 mg | ORAL_TABLET | Freq: Four times a day (QID) | ORAL | Status: DC | PRN

## 2019-02-21 MED ORDER — PROPOFOL 10 MG/ML IV BOLUS
INTRAVENOUS | Status: AC
  Filled 2019-02-21: qty 20

## 2019-02-21 MED ORDER — MIDAZOLAM HCL 2 MG/2ML IJ SOLN
INTRAMUSCULAR | Status: DC | PRN
  Administered 2019-02-21: 2 mg via INTRAVENOUS

## 2019-02-21 SURGICAL SUPPLY — 18 items
CEMENT KYPHON CX01A KIT/MIXER (Cement) ×2 IMPLANT
COVER WAND RF STERILE (DRAPES) IMPLANT
DERMABOND ADVANCED (GAUZE/BANDAGES/DRESSINGS) ×1
DERMABOND ADVANCED .7 DNX12 (GAUZE/BANDAGES/DRESSINGS) ×1 IMPLANT
DEVICE BIOPSY BONE KYPHX (INSTRUMENTS) ×2 IMPLANT
DRAPE C-ARM XRAY 36X54 (DRAPES) ×2 IMPLANT
DURAPREP 26ML APPLICATOR (WOUND CARE) ×2 IMPLANT
GLOVE SURG SYN 9.0  PF PI (GLOVE) ×1
GLOVE SURG SYN 9.0 PF PI (GLOVE) ×1 IMPLANT
GOWN SRG 2XL LVL 4 RGLN SLV (GOWNS) ×1 IMPLANT
GOWN STRL NON-REIN 2XL LVL4 (GOWNS) ×1
GOWN STRL REUS W/ TWL LRG LVL3 (GOWN DISPOSABLE) ×1 IMPLANT
GOWN STRL REUS W/TWL LRG LVL3 (GOWN DISPOSABLE) ×1
PACK KYPHOPLASTY (MISCELLANEOUS) ×2 IMPLANT
RENTAL RFA GENERATOR (MISCELLANEOUS) IMPLANT
STRAP SAFETY 5IN WIDE (MISCELLANEOUS) ×2 IMPLANT
TRAY KYPHOPAK 15/3 EXPRESS 1ST (MISCELLANEOUS) IMPLANT
TRAY KYPHOPAK 20/3 EXPRESS 1ST (MISCELLANEOUS) ×2 IMPLANT

## 2019-02-21 NOTE — Anesthesia Preprocedure Evaluation (Addendum)
Anesthesia Evaluation  Patient identified by MRN, date of birth, ID band Patient awake    Reviewed: Allergy & Precautions, H&P , NPO status , Patient's Chart, lab work & pertinent test results  Airway Mallampati: III  TM Distance: >3 FB     Dental  (+) Missing, Poor Dentition   Pulmonary shortness of breath and with exertion, COPD (2L O2 at night),  COPD inhaler and oxygen dependent, former smoker,           Cardiovascular hypertension, + CAD, + Past MI and +CHF  + dysrhythmias (complete heart block s/p pacemaker) + pacemaker   Echo 09/22/18: - Left ventricle: The cavity size was mildly to moderately dilated.   Wall thickness was increased in a pattern of severe LVH. Systolic   function was normal. The estimated ejection fraction was in the   range of 55% to 60%. There is severe hypokinesis of the   apicalinferior, inferoseptal, and apical myocardium. The study is   not technically sufficient to allow evaluation of LV diastolic   function. - Aortic valve: Sclerosis without stenosis. There was trivial   regurgitation. - Mitral valve: Calcified annulus. Moderately thickened, moderately   calcified leaflets . Leaflet separation was mildly reduced. The   findings are consistent with mild stenosis. - Right ventricle: The cavity size was normal. Wall thickness was   mildly increased. Systolic function was normal. - Right atrium: The atrium was mildly dilated.   Neuro/Psych negative neurological ROS  negative psych ROS   GI/Hepatic negative GI ROS, Neg liver ROS,   Endo/Other  diabetes, Insulin DependentMorbid obesity  Renal/GU CRFRenal disease  negative genitourinary   Musculoskeletal   Abdominal   Peds  Hematology  (+) Blood dyscrasia, anemia ,   Anesthesia Other Findings Past Medical History: No date: Chronic combined systolic (congestive) and diastolic  (congestive) heart failure ([place])     Comment:  a. 09/2015 Echo:  normal EF 55%; b. 07/2017 Echo: EF 25%,               Gr2 DD, mild lvh, mid ant/inf/infsept/apical AK. Triv MR.              mildly to mod dil LA. No date: CKD (chronic kidney disease), stage III (HCC) No date: COPD (chronic obstructive pulmonary disease) (HCC)     Comment:  Not on home o2 No date: Diabetes mellitus without complication ([place])     Comment:  On Levemir No date: Non-ischemic cardiomyopathy ([place])     Comment:  a. 07/2017 Echo: EF 25%, Gr2 DD - ? takotsubo. No date: Non-obstructive CAD (coronary artery disease)     Comment:  a. 07/2017 Cath: LM nl, LAD 40ost, D1 50, D2 40, LCX               62m, OM1/2 nl, RCA large, nl. No date: Syncope     Comment:  a.  2010 in setting of CHB-->s/p pacemaker w/ Gen change              in 08/2014.  Past Surgical History: No date: APPENDECTOMY 11/27/2015: ESOPHAGOGASTRODUODENOSCOPY; N/A     Comment:  Procedure: ESOPHAGOGASTRODUODENOSCOPY (EGD);  Surgeon:               Lollie Sails, MD;  Location: [facility] ENDOSCOPY;                Service: Endoscopy;  Laterality: N/A; 07/21/2017: LEFT HEART CATH AND CORONARY ANGIOGRAPHY; N/A     Comment:  Procedure: LEFT  HEART CATH AND CORONARY ANGIOGRAPHY;                Surgeon: [name], MD;  Location: [place] [facility]               LAB;  Service: Cardiovascular;  Laterality: N/A; No date: PACEMAKER INSERTION No date: TONSILLECTOMY No date: TUBAL LIGATION  BMI    Body Mass Index:  41.10 kg/m      Reproductive/Obstetrics negative OB ROS                           Anesthesia Physical Anesthesia Plan  ASA: III  Anesthesia Plan: General   Post-op Pain Management:    Induction:   PONV Risk Score and Plan: Propofol infusion and TIVA  Airway Management Planned: Natural Airway and Simple Face Mask  Additional Equipment:   Intra-op Plan:   Post-operative Plan:   Informed Consent: I have reviewed the patients History and Physical, chart, labs and discussed the  procedure including the risks, benefits and alternatives for the proposed anesthesia with the patient or authorized representative who has indicated his/her understanding and acceptance.     Dental Advisory Given  Plan Discussed with: Anesthesiologist and CRNA  Anesthesia Plan Comments:        Anesthesia Quick Evaluation

## 2019-02-21 NOTE — Anesthesia Post-op Follow-up Note (Signed)
Anesthesia QCDR form completed.        

## 2019-02-21 NOTE — Telephone Encounter (Signed)
°  1. Has your device fired? na  2. Is you device beeping? na  3. Are you experiencing draining or swelling at device site? na  4. Are you calling to see if we received your device transmission? na  5. Have you passed out? Na  [facility] pre op calling with directive to send device clearance form to [phone] Faxed form to device clinic     Please route to [place] .

## 2019-02-21 NOTE — Discharge Instructions (Addendum)
Take it easy tonight and tomorrow and resume more normal activities on Saturday as tolerated.  Band-Aid can be removed on Saturday and then okay to shower.  Heat or ice as needed along with pain pills previously prescribed.  Call office if you are having problems   AMBULATORY SURGERY  DISCHARGE INSTRUCTIONS   1) The drugs that you were given will stay in your system until tomorrow so for the next 24 hours you should not:  A) Drive an automobile B) Make any legal decisions C) Drink any alcoholic beverage   2) You may resume regular meals tomorrow.  Today it is better to start with liquids and gradually work up to solid foods.  You may eat anything you prefer, but it is better to start with liquids, then soup and crackers, and gradually work up to solid foods.   3) Please notify your doctor immediately if you have any unusual bleeding, trouble breathing, redness and pain at the surgery site, drainage, fever, or pain not relieved by medication.    4) Additional Instructions:        Please contact your physician with any problems or Same Day Surgery at [phone], Monday through Friday 6 am to 4 pm, or [place] at [facility] number at [phone].

## 2019-02-21 NOTE — Op Note (Signed)
Date February 21, 2019  time 9:24 PM   PATIENT:  Teresa Mercado   PRE-OPERATIVE DIAGNOSIS:  closed wedge compression fracture of L4   POST-OPERATIVE DIAGNOSIS:  closed wedge compression fracture of L4   PROCEDURE:  Procedure(s): KYPHOPLASTY L4  SURGEON: Laurene Footman, MD   ASSISTANTS: None   ANESTHESIA:   local and MAC   EBL:  No intake/output data recorded.   BLOOD ADMINISTERED:none   DRAINS: none    LOCAL MEDICATIONS USED:  MARCAINE    and XYLOCAINE    SPECIMEN:   Vertebral body biopsy of L4   DISPOSITION OF SPECIMEN:  Pathology   COUNTS:  YES   TOURNIQUET:  * No tourniquets in log *   IMPLANTS: Bone cement   DICTATION: .Dragon Dictation  patient was brought to the operating room and after adequate anesthesia was obtained the patient was placed prone.  C arm was brought in in good visualization of the affected level obtained on both AP and lateral projections.  After patient identification and timeout procedures were completed, local anesthetic was infiltrated with 10 cc 1% Xylocaine infiltrated subcutaneously.  This is done the area on the right side of the planned approach.  The back was then prepped and draped in the usual sterile manner and repeat timeout procedure carried out.  A spinal needle was brought down to the pedicle on the right side of  L4 and a 50-50 mix of 1% Xylocaine half percent Sensorcaine with epinephrine total of 40 cc injected.  After allowing this to set a small incision was made and the trocar was advanced into the vertebral body in an extrapedicular fashion.  Biopsy was obtained Drilling was carried out balloon inserted with inflation to  to cc.  When the cement was appropriate consistency 3-1/2 cc were injected into the vertebral body with some extravasation into the L3-4 disc space, good fill superior  endplates and from right to midline along the  superior endplate.  After the cement had set the trochar was removed and permanent C-arm views obtained.  The  wound was closed with Dermabond followed by Band-Aid   PLAN OF CARE: Discharge to home after PACU   PATIENT DISPOSITION:  PACU - hemodynamically stable.

## 2019-02-21 NOTE — H&P (Signed)
Reviewed paper H+P, will be scanned into chart. No changes noted.  

## 2019-02-21 NOTE — Transfer of Care (Signed)
Immediate Anesthesia Transfer of Care Note  Patient: Arra Connaughton  Procedure(s) Performed: KYPHOPLASTY L4 - DIABETIC (N/A Back)  Patient Location: PACU  Anesthesia Type:MAC  Level of Consciousness: awake, alert  and oriented  Airway & Oxygen Therapy: Patient connected to nasal cannula oxygen  Post-op Assessment: Post -op Vital signs reviewed and stable  Post vital signs: stable  Last Vitals:  Vitals Value Taken Time  BP 121/45 02/21/2019  9:27 PM  Temp 36.5 C 02/21/2019  9:26 PM  Pulse 92 02/21/2019  9:27 PM  Resp 26 02/21/2019  9:27 PM  SpO2 96 % 02/21/2019  9:27 PM  Vitals shown include unvalidated device data.  Last Pain:  Vitals:   02/21/19 2126  TempSrc:   PainSc: (P) Asleep         Complications: No apparent anesthesia complications

## 2019-02-21 NOTE — Telephone Encounter (Signed)
Form faxed back, confirmation received.

## 2019-02-22 ENCOUNTER — Encounter: Payer: Self-pay | Admitting: Orthopedic Surgery

## 2019-02-22 NOTE — Anesthesia Postprocedure Evaluation (Signed)
Anesthesia Post Note  Patient: Teresa Mercado  Procedure(s) Performed: KYPHOPLASTY L4 - DIABETIC (N/A Back)  Patient location during evaluation: PACU Anesthesia Type: General Level of consciousness: awake and alert Pain management: pain level controlled Vital Signs Assessment: post-procedure vital signs reviewed and stable Respiratory status: spontaneous breathing, nonlabored ventilation, respiratory function stable and patient connected to nasal cannula oxygen Cardiovascular status: blood pressure returned to baseline and stable Postop Assessment: no apparent nausea or vomiting Anesthetic complications: no     Last Vitals:  Vitals:   02/21/19 2211 02/21/19 2227  BP: 133/60 120/70  Pulse: 85 82  Resp: 20 16  Temp: 36.4 C   SpO2: 97% 96%    Last Pain:  Vitals:   02/21/19 2227  TempSrc:   PainSc: 0-No pain                 Precious Haws Piscitello

## 2019-02-25 LAB — SURGICAL PATHOLOGY

## 2019-11-27 ENCOUNTER — Telehealth: Payer: Medicare Other | Admitting: Cardiovascular Disease

## 2020-02-13 ENCOUNTER — Other Ambulatory Visit: Payer: Self-pay | Admitting: Internal Medicine

## 2020-02-13 DIAGNOSIS — S32040A Wedge compression fracture of fourth lumbar vertebra, initial encounter for closed fracture: Secondary | ICD-10-CM

## 2020-03-14 IMAGING — CR DG CHEST 2V
1 series · 2 of 2 positions shown · non-contrast
Comparison: PA and lateral chest x-ray September 13, 2017

CLINICAL DATA: Four week history of significant weight gain,
orthopnea, shortness of breath, and lower extremity edema. History
of cardiomyopathy, chronic CHF, chronic renal insufficiency stage
III.

EXAM:
CHEST - 2 VIEW

[Series 1: dg chest 2 view · 0.14mm/px · 2 of 2 slices shown]
[im 1/2]
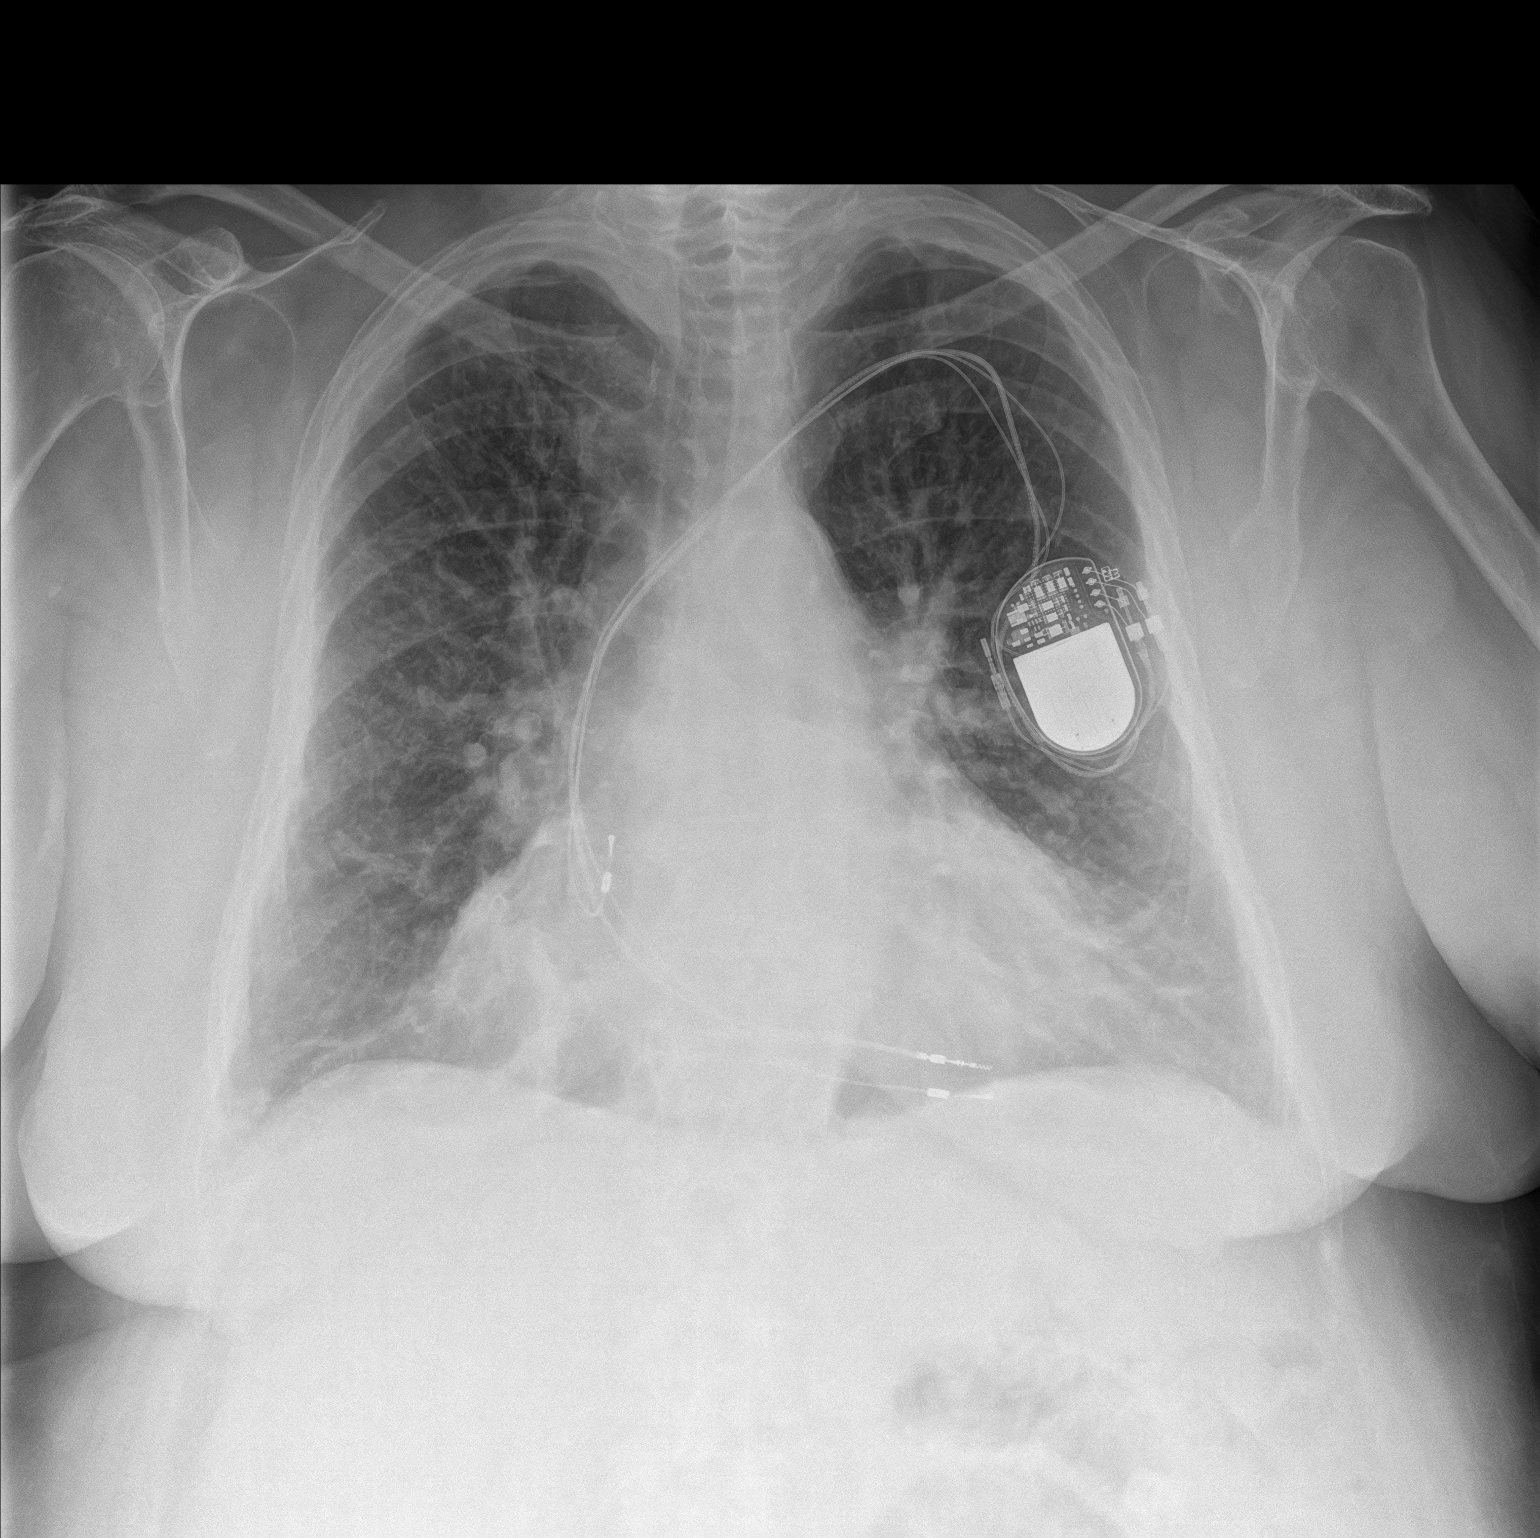
[im 2/2]
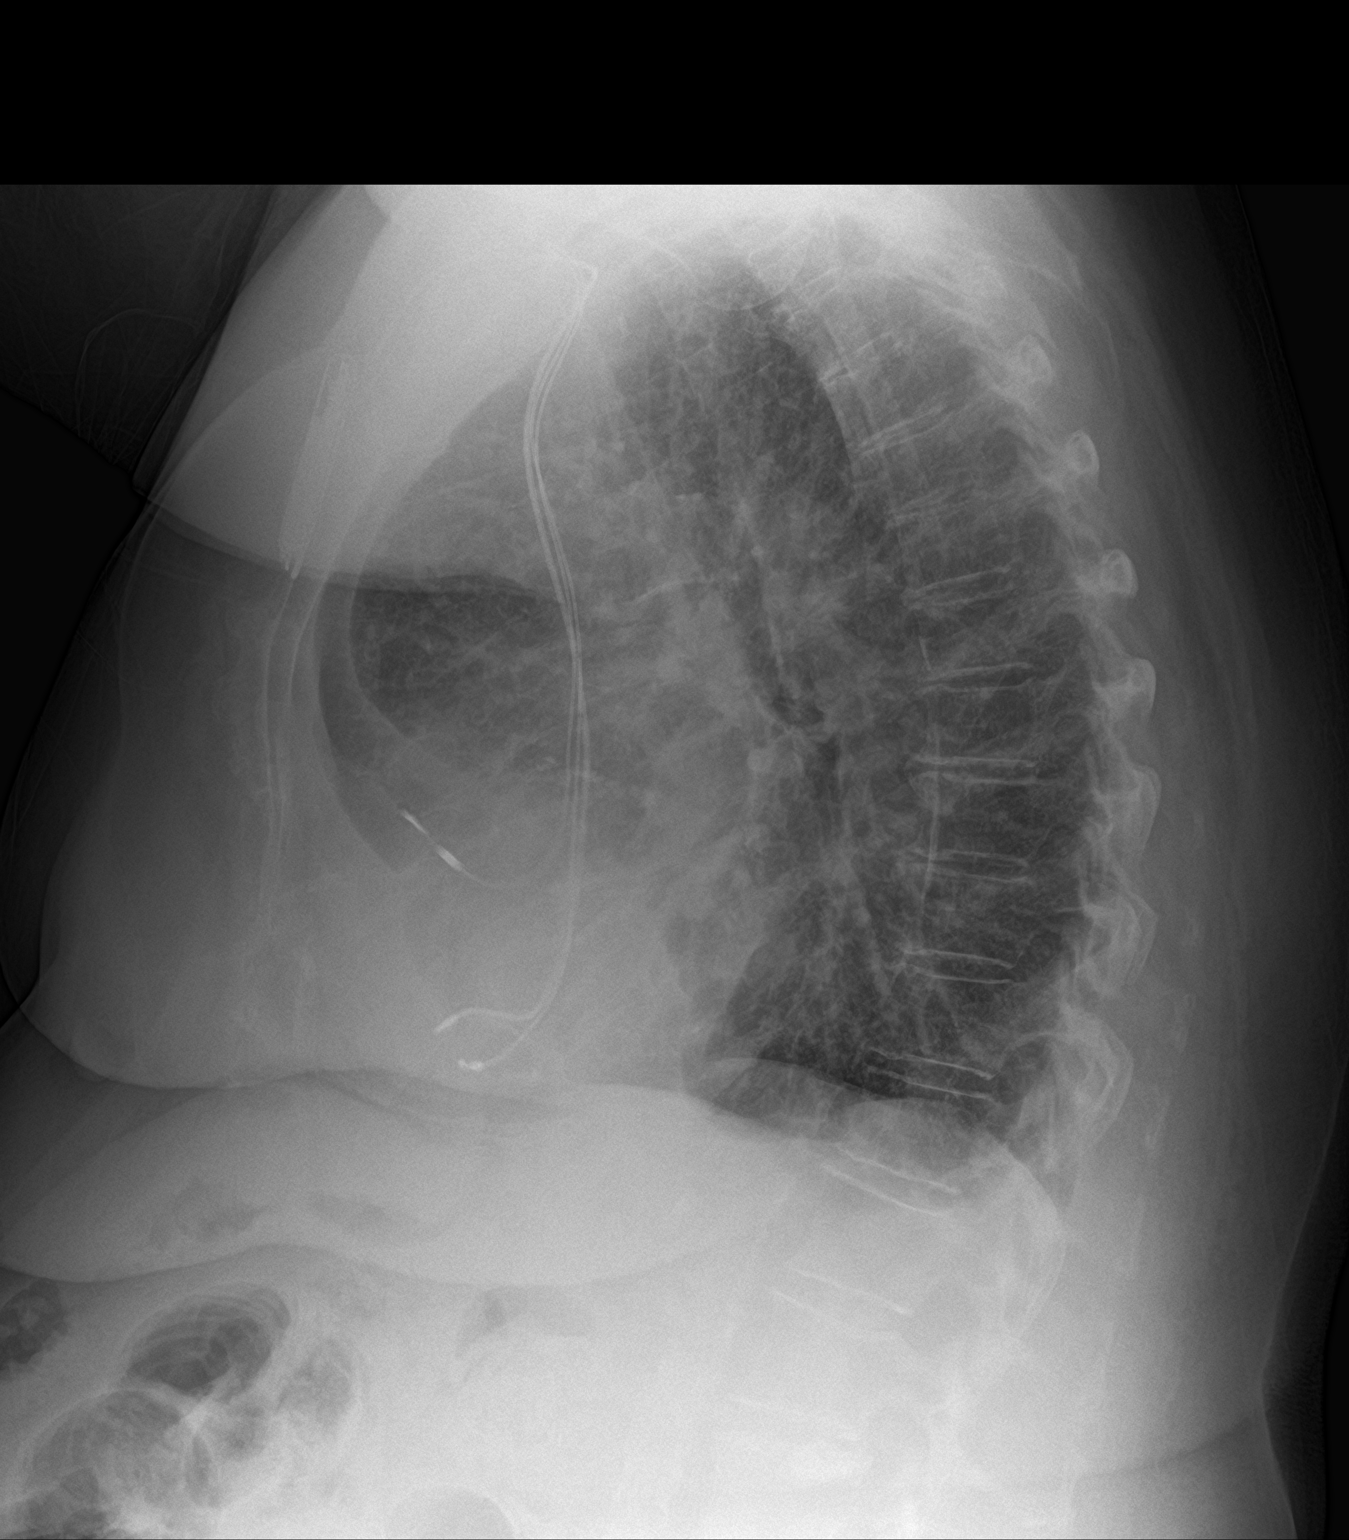

[2 of 2 positions shown; findings below may reference images not displayed]

FINDINGS: The lungs remain hyperinflated. The interstitial markings are coarse
and slightly more conspicuous today. The pulmonary vascularity
remains engorged and is more prominent overall. The cardiac
silhouette is enlarged. The ICD is in stable position. There is
calcification in the wall of the thoracic aorta. There is no
significant pleural effusion. The bony thorax is unremarkable.
IMPRESSION: CHF with mild interstitial edema which has worsened since the
previous study. No alveolar pneumonia nor large pleural effusion.

Thoracic aortic atherosclerosis.

## 2020-05-05 ENCOUNTER — Ambulatory Visit: Payer: Self-pay

## 2020-05-05 ENCOUNTER — Encounter: Payer: Self-pay | Admitting: Orthopaedic Surgery

## 2020-05-05 ENCOUNTER — Ambulatory Visit (INDEPENDENT_AMBULATORY_CARE_PROVIDER_SITE_OTHER): Payer: Medicare Other | Admitting: Orthopaedic Surgery

## 2020-05-05 VITALS — Ht 63.0 in | Wt 225.0 lb

## 2020-05-05 DIAGNOSIS — S42292A Other displaced fracture of upper end of left humerus, initial encounter for closed fracture: Secondary | ICD-10-CM | POA: Diagnosis not present

## 2020-05-05 DIAGNOSIS — M25512 Pain in left shoulder: Secondary | ICD-10-CM

## 2020-05-05 NOTE — Progress Notes (Signed)
Office Visit Note   Patient: Teresa Mercado           Date of Birth: 11/28/1945           MRN: [id number] Visit Date: 05/05/2020              Requested by: Idelle Crouch, MD [facility] [place],  [place] [address] PCP: Idelle Crouch, MD   Assessment & Plan: Visit Diagnoses:  1. Acute pain of left shoulder   2.   Left prepatellar subcu hematoma  Plan: We will place patient in shoulder immobilizer keep arm across the front of her chest which should make it much more comfortable.  I will recheck her again in 3 weeks with repeat x-rays in the [facility] office of her left shoulder.  We discussed prepatellar hematoma burning could be aspirated if she has persistent problems with the present she understands she should get some resorption with time.  Follow-Up Instructions: No follow-ups on file.   Orders:  Orders Placed This Encounter  Procedures  . XR Shoulder Left   No orders of the defined types were placed in this encounter.     Procedures: No procedures performed   Clinical Data: No additional findings.   Subjective: Chief Complaint  Patient presents with  . Left Shoulder - Fracture    Fall 04/18/2020    HPI 74 year old female with heart failure, pacemaker renal failure morbid obesity had a fall 04/18/2020 with left proximal humerus fracture three-part.  Greater tuberosity was separate fragment without significant displacement and glenohumeral joint remained reduced.  She also landed on her knee she is on aspirin and has a large prepatellar bursa hematoma that extends laterally over the pes bursa.  She not take any pain medication although she has something at home.  Patient is accompanied with her husband today.  Review of Systems positive for renal failure congestive heart failure acute on chronic in the past ischemic cardiomyopathy poorly controlled type 2 diabetes.  Otherwise noncontributory to HPI.   Objective: Vital Signs: Ht 5\' 3"  (1.6  m)   Wt 225 lb (102.1 kg)   BMI 39.86 kg/m   Physical Exam Constitutional:      Appearance: She is well-developed.  HENT:     Head: Normocephalic.     Right Ear: External ear normal.     Left Ear: External ear normal.  Eyes:     Pupils: Pupils are equal, round, and reactive to light.  Neck:     Thyroid: No thyromegaly.     Trachea: No tracheal deviation.  Cardiovascular:     Rate and Rhythm: Normal rate.  Pulmonary:     Effort: Pulmonary effort is normal.  Abdominal:     Palpations: Abdomen is soft.  Skin:    General: Skin is warm and dry.  Neurological:     Mental Status: She is alert and oriented to person, place, and time.  Psychiatric:        Behavior: Behavior normal.     Ortho Exam patient is in a sling.  Good sensation in her hand.  She has to pull on her fingers keeping her arm in front of her chest all day long.  Axillary sensation is intact.  Specialty Comments:  No specialty comments available.  Imaging: No results found.   PMFS History: Patient Active Problem List   Diagnosis Date Noted  . Acute on chronic combined systolic and diastolic CHF, NYHA class 3 ([place]) 09/30/2018  .  Nonischemic cardiomyopathy ([place]) 09/30/2018  . Essential hypertension 09/30/2018  . Acute on chronic heart failure ([place]) 09/21/2018  . Chest pain 07/20/2017  . CKD (chronic kidney disease), stage III 07/20/2017  . Poorly controlled type 2 diabetes mellitus ([place]) 08/05/2016  . 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 Complete heart block ([place]) 08/05/2016  . Chronic diastolic CHF (congestive heart failure) ([place]) 12/08/2015  . Morbid obesity due to excess calories ([place]) 12/08/2015  . Pacemaker 12/08/2015  . Shortness of breath 12/08/2015  . Bilateral edema of lower extremity 12/08/2015  . Iron deficiency anemia due to chronic blood loss 12/08/2015  . Anemia 11/25/2015   Past Medical History:  Diagnosis Date  . Chronic combined systolic  (congestive) and diastolic (congestive) heart failure ([place])    a. 09/2015 Echo: normal EF 55%; b. 07/2017 Echo: EF 25%, Gr2 DD, mild lvh, mid ant/inf/infsept/apical AK. Triv MR. mildly to mod dil LA.  . CKD (chronic kidney disease), stage III   . COPD (chronic obstructive pulmonary disease) (HCC)    Not on home o2  . Diabetes mellitus without complication ([place])    On Levemir  . Non-ischemic cardiomyopathy ([place])    a. 07/2017 Echo: EF 25%, Gr2 DD - ? takotsubo.  . Non-obstructive CAD (coronary artery disease)    a. 07/2017 Cath: LM nl, LAD 40ost, D1 50, D2 40, LCX 55m, OM1/2 nl, RCA large, nl.  . Syncope    a.  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    Family History  Problem Relation Age of Onset  . Kidney cancer Father   . Esophageal cancer Brother     Past Surgical History:  Procedure Laterality Date  . APPENDECTOMY    . ESOPHAGOGASTRODUODENOSCOPY N/A 11/27/2015   Procedure: ESOPHAGOGASTRODUODENOSCOPY (EGD);  Surgeon: Lollie Sails, MD;  Location: [facility] ENDOSCOPY;  Service: Endoscopy;  Laterality: N/A;  . KYPHOPLASTY N/A 02/21/2019   Procedure: KYPHOPLASTY L4 - DIABETIC;  Surgeon: Hessie Knows, MD;  Location: [facility] ORS;  Service: Orthopedics;  Laterality: N/A;  . LEFT HEART CATH AND CORONARY ANGIOGRAPHY N/A 07/21/2017   Procedure: LEFT HEART CATH AND CORONARY ANGIOGRAPHY;  Surgeon: [name], MD;  Location: [facility] LAB;  Service: Cardiovascular;  Laterality: N/A;  . PACEMAKER INSERTION    . TONSILLECTOMY    . TUBAL LIGATION     Social History   Occupational History  . Not on file  Tobacco Use  . Smoking status: Former Smoker    Packs/day: 2.00    Types: Cigarettes    Quit date: 11/24/2005    Years since quitting: 14.4  . Smokeless tobacco: Never Used  . Tobacco comment: Quit 10 years ago, but has 45 years smoking prior to that  Vaping Use  . Vaping Use: Never used  Substance and Sexual Activity  . Alcohol use: No    Alcohol/week:  0.0 standard drinks  . Drug use: No  . Sexual activity: Not on file

## 2020-05-21 ENCOUNTER — Other Ambulatory Visit: Payer: Self-pay

## 2020-05-21 ENCOUNTER — Ambulatory Visit (INDEPENDENT_AMBULATORY_CARE_PROVIDER_SITE_OTHER): Payer: Medicare Other

## 2020-05-21 ENCOUNTER — Ambulatory Visit (INDEPENDENT_AMBULATORY_CARE_PROVIDER_SITE_OTHER): Payer: Medicare Other | Admitting: Orthopaedic Surgery

## 2020-05-21 ENCOUNTER — Encounter: Payer: Self-pay | Admitting: Orthopaedic Surgery

## 2020-05-21 VITALS — Ht 63.0 in | Wt 225.0 lb

## 2020-05-21 DIAGNOSIS — S42292A Other displaced fracture of upper end of left humerus, initial encounter for closed fracture: Secondary | ICD-10-CM

## 2020-05-21 NOTE — Progress Notes (Signed)
Office Visit Note   Patient: Teresa Mercado           Date of Birth: [birth year]           MRN: [id number] Visit Date: 05/21/2020              Requested by: Idelle Crouch, MD [facility] [place],  [place] [address] PCP: Idelle Crouch, MD   Assessment & Plan: Visit Diagnoses:  1. Closed 3-part fracture of proximal humerus, left, initial encounter   2.   Left knee prepatellar bursal hematoma  Plan: Return 4 weeks repeat x-ray proximal humerus left on return.  At this point I do not think we need to aspirate the pretibial tubercle and prepatellar bursa hematoma which seems to have decreased in size slightly.  Follow-Up Instructions: Return in about 4 weeks (around 06/18/2020).   Orders:  Orders Placed This Encounter  Procedures  . XR Shoulder Left   No orders of the defined types were placed in this encounter.     Procedures: No procedures performed   Clinical Data: No additional findings.   Subjective: Chief Complaint  Patient presents with  . Left Shoulder - Follow-up, Fracture    Fall 04/18/2020    HPI 74 year old female returns.  She states she cannot wear a mask due to COPD emphysema and heart failure.  She has not had a Covid vaccination.  She states she has pain in her shoulder when she removes it for bathing.  Review of Systems 14 point system update unchanged from last office visit.  Diabetic who is on insulin.   Objective: Vital Signs: Ht 5\' 3"  (1.6 m)   Wt 225 lb (102.1 kg)   BMI 39.86 kg/m   Physical Exam Constitutional:      Appearance: She is well-developed.  HENT:     Head: Normocephalic.     Right Ear: External ear normal.     Left Ear: External ear normal.  Eyes:     Pupils: Pupils are equal, round, and reactive to light.  Neck:     Thyroid: No thyromegaly.     Trachea: No tracheal deviation.  Cardiovascular:     Rate and Rhythm: Normal rate.  Pulmonary:     Effort: Pulmonary effort is normal.    Abdominal:     Palpations: Abdomen is soft.  Skin:    General: Skin is warm and dry.  Neurological:     Mental Status: She is alert and oriented to person, place, and time.  Psychiatric:        Behavior: Behavior normal.     Ortho Exam prepatellar bursa hematoma is not enlarged left knee.  Left shoulder immobilizer is appropriately placed.  Axillary sensation is intact.  Specialty Comments:  No specialty comments available.  Imaging: XR Shoulder Left  Result Date: 05/21/2020 2 view x-rays left shoulder obtained and reviewed.  This again shows three-part Neer fracture with greater tuberosity separate fragment slight inferior position of the humeral head to the glenoid.  Some callus formation. Impression: Three-part proximal humerus fracture with some partial healing.    PMFS History: Patient Active Problem List   Diagnosis Date Noted  . Closed 3-part fracture of proximal humerus, left, initial encounter 05/05/2020  . Acute on chronic combined systolic and diastolic CHF, NYHA class 3 ([place]) 09/30/2018  . Nonischemic cardiomyopathy ([place]) 09/30/2018  . Essential hypertension 09/30/2018  . Acute on chronic heart failure ([place]) 09/21/2018  . Chest pain 07/20/2017  .  CKD (chronic kidney disease), stage III 07/20/2017  . Poorly controlled type 2 diabetes mellitus ([place]) 08/05/2016  . 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 Complete heart block ([place]) 08/05/2016  . Chronic diastolic CHF (congestive heart failure) ([place]) 12/08/2015  . Morbid obesity due to excess calories ([place]) 12/08/2015  . Pacemaker 12/08/2015  . Shortness of breath 12/08/2015  . Bilateral edema of lower extremity 12/08/2015  . Iron deficiency anemia due to chronic blood loss 12/08/2015  . Anemia 11/25/2015   Past Medical History:  Diagnosis Date  . Chronic combined systolic (congestive) and diastolic (congestive) heart failure ([place])    a. 09/2015 Echo: normal EF 55%; b. 07/2017  Echo: EF 25%, Gr2 DD, mild lvh, mid ant/inf/infsept/apical AK. Triv MR. mildly to mod dil LA.  . CKD (chronic kidney disease), stage III   . COPD (chronic obstructive pulmonary disease) (HCC)    Not on home o2  . Diabetes mellitus without complication ([place])    On Levemir  . Non-ischemic cardiomyopathy ([place])    a. 07/2017 Echo: EF 25%, Gr2 DD - ? takotsubo.  . Non-obstructive CAD (coronary artery disease)    a. 07/2017 Cath: LM nl, LAD 40ost, D1 50, D2 40, LCX 44m, OM1/2 nl, RCA large, nl.  . Syncope    a.  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    Family History  Problem Relation Age of Onset  . Kidney cancer Father   . Esophageal cancer Brother     Past Surgical History:  Procedure Laterality Date  . APPENDECTOMY    . ESOPHAGOGASTRODUODENOSCOPY N/A 11/27/2015   Procedure: ESOPHAGOGASTRODUODENOSCOPY (EGD);  Surgeon: Lollie Sails, MD;  Location: [facility] ENDOSCOPY;  Service: Endoscopy;  Laterality: N/A;  . KYPHOPLASTY N/A 02/21/2019   Procedure: KYPHOPLASTY L4 - DIABETIC;  Surgeon: Hessie Knows, MD;  Location: [facility] ORS;  Service: Orthopedics;  Laterality: N/A;  . LEFT HEART CATH AND CORONARY ANGIOGRAPHY N/A 07/21/2017   Procedure: LEFT HEART CATH AND CORONARY ANGIOGRAPHY;  Surgeon: [name], MD;  Location: [facility] LAB;  Service: Cardiovascular;  Laterality: N/A;  . PACEMAKER INSERTION    . TONSILLECTOMY    . TUBAL LIGATION     Social History   Occupational History  . Not on file  Tobacco Use  . Smoking status: Former Smoker    Packs/day: 2.00    Types: Cigarettes    Quit date: 11/24/2005    Years since quitting: 14.4  . Smokeless tobacco: Never Used  . Tobacco comment: Quit 10 years ago, but has 45 years smoking prior to that  Vaping Use  . Vaping Use: Never used  Substance and Sexual Activity  . Alcohol use: No    Alcohol/week: 0.0 standard drinks  . Drug use: No  . Sexual activity: Not on file

## 2020-06-18 ENCOUNTER — Ambulatory Visit (INDEPENDENT_AMBULATORY_CARE_PROVIDER_SITE_OTHER): Payer: Medicare Other | Admitting: Orthopaedic Surgery

## 2020-06-18 ENCOUNTER — Other Ambulatory Visit: Payer: Self-pay

## 2020-06-18 ENCOUNTER — Ambulatory Visit: Payer: Self-pay

## 2020-06-18 DIAGNOSIS — S42292A Other displaced fracture of upper end of left humerus, initial encounter for closed fracture: Secondary | ICD-10-CM

## 2020-06-18 NOTE — Progress Notes (Signed)
Office Visit Note   Patient: Teresa Mercado           Date of Birth: 1946-06-17           MRN: [id number] Visit Date: 06/18/2020              Requested by: Idelle Crouch, MD [facility] [place],  [place] [address] PCP: Idelle Crouch, MD   Assessment & Plan: Visit Diagnoses:  1. Closed 3-part fracture of proximal humerus, left, initial encounter     Plan: She will work her way out of the sling begin using a pulley at home work on active assistive range of motion which her husband can fix up for her.  Recheck 24month for physical exam only.  Follow-Up Instructions: No follow-ups on file.   Orders:  Orders Placed This Encounter  Procedures  . XR Humerus Left   No orders of the defined types were placed in this encounter.     Procedures: No procedures performed   Clinical Data: No additional findings.   Subjective: Chief Complaint  Patient presents with  . f/u proximal humerus fracture    DOI 04/18/20    HPI follow-up 3 part left proximal humerus fracture.  X-rays show some interval healing.  She can begin using a pulley at home to work on gentle range of motion.  Review of Systems unchanged from office visit 05/05/2020.   Objective: Vital Signs: There were no vitals taken for this visit.  Physical Exam Constitutional:      Appearance: She is well-developed.  HENT:     Head: Normocephalic.     Right Ear: External ear normal.     Left Ear: External ear normal.  Eyes:     Pupils: Pupils are equal, round, and reactive to light.  Neck:     Thyroid: No thyromegaly.     Trachea: No tracheal deviation.  Cardiovascular:     Rate and Rhythm: Normal rate.  Pulmonary:     Effort: Pulmonary effort is normal.  Abdominal:     Palpations: Abdomen is soft.  Skin:    General: Skin is warm and dry.  Neurological:     Mental Status: She is alert and oriented to person, place, and time.  Psychiatric:        Behavior: Behavior normal.       Ortho Exam intact deltoid sensory region.  Median ulnar sensation in the hand is intact.  Specialty Comments:  No specialty comments available.  Imaging: No results found.   PMFS History: Patient Active Problem List   Diagnosis Date Noted  . Closed 3-part fracture of proximal humerus, left, initial encounter 05/05/2020  . Acute on chronic combined systolic and diastolic CHF, NYHA class 3 ([place]) 09/30/2018  . Nonischemic cardiomyopathy ([place]) 09/30/2018  . Essential hypertension 09/30/2018  . Acute on chronic heart failure ([place]) 09/21/2018  . Chest pain 07/20/2017  . CKD (chronic kidney disease), stage III 07/20/2017  . Poorly controlled type 2 diabetes mellitus ([place]) 08/05/2016  . 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 Complete heart block ([place]) 08/05/2016  . Chronic diastolic CHF (congestive heart failure) ([place]) 12/08/2015  . Morbid obesity due to excess calories ([place]) 12/08/2015  . Pacemaker 12/08/2015  . Shortness of breath 12/08/2015  . Bilateral edema of lower extremity 12/08/2015  . Iron deficiency anemia due to chronic blood loss 12/08/2015  . Anemia 11/25/2015   Past Medical History:  Diagnosis Date  .  Chronic combined systolic (congestive) and diastolic (congestive) heart failure ([place])    a. 09/2015 Echo: normal EF 55%; b. 07/2017 Echo: EF 25%, Gr2 DD, mild lvh, mid ant/inf/infsept/apical AK. Triv MR. mildly to mod dil LA.  . CKD (chronic kidney disease), stage III   . COPD (chronic obstructive pulmonary disease) (HCC)    Not on home o2  . Diabetes mellitus without complication ([place])    On Levemir  . Non-ischemic cardiomyopathy ([place])    a. 07/2017 Echo: EF 25%, Gr2 DD - ? takotsubo.  . Non-obstructive CAD (coronary artery disease)    a. 07/2017 Cath: LM nl, LAD 40ost, D1 50, D2 40, LCX 86m, OM1/2 nl, RCA large, nl.  . Syncope    a.  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    Family History   Problem Relation Age of Onset  . Kidney cancer Father   . Esophageal cancer Brother     Past Surgical History:  Procedure Laterality Date  . APPENDECTOMY    . ESOPHAGOGASTRODUODENOSCOPY N/A 11/27/2015   Procedure: ESOPHAGOGASTRODUODENOSCOPY (EGD);  Surgeon: Lollie Sails, MD;  Location: [facility] ENDOSCOPY;  Service: Endoscopy;  Laterality: N/A;  . KYPHOPLASTY N/A 02/21/2019   Procedure: KYPHOPLASTY L4 - DIABETIC;  Surgeon: Hessie Knows, MD;  Location: [facility] ORS;  Service: Orthopedics;  Laterality: N/A;  . LEFT HEART CATH AND CORONARY ANGIOGRAPHY N/A 07/21/2017   Procedure: LEFT HEART CATH AND CORONARY ANGIOGRAPHY;  Surgeon: [name], MD;  Location: [facility] CV LAB;  Service: Cardiovascular;  Laterality: N/A;  . PACEMAKER INSERTION    . TONSILLECTOMY    . TUBAL LIGATION     Social History   Occupational History  . Not on file  Tobacco Use  . Smoking status: Former Smoker    Packs/day: 2.00    Types: Cigarettes    Quit date: 11/24/2005    Years since quitting: 14.5  . Smokeless tobacco: Never Used  . Tobacco comment: Quit 10 years ago, but has 45 years smoking prior to that  Vaping Use  . Vaping Use: Never used  Substance and Sexual Activity  . Alcohol use: No    Alcohol/week: 0.0 standard drinks  . Drug use: No  . Sexual activity: Not on file

## 2020-07-11 IMAGING — CT CT LUMBAR SPINE WITHOUT CONTRAST
3 series · 14 of 33 positions shown, 17 images · non-contrast
Comparison: None

CLINICAL DATA: Right leg pain back pain

EXAM:
CT LUMBAR SPINE WITHOUT CONTRAST
TECHNIQUE: Multidetector CT imaging of the lumbar spine was performed without
intravenous contrast administration. Multiplanar CT image
reconstructions were also generated.

[Series 4: l spine soft · axial · 0.31mm/px · z∈[-174,-22]mm · 6 of 94 slices shown, 8 images]
[im 15/94  soft-tissue]
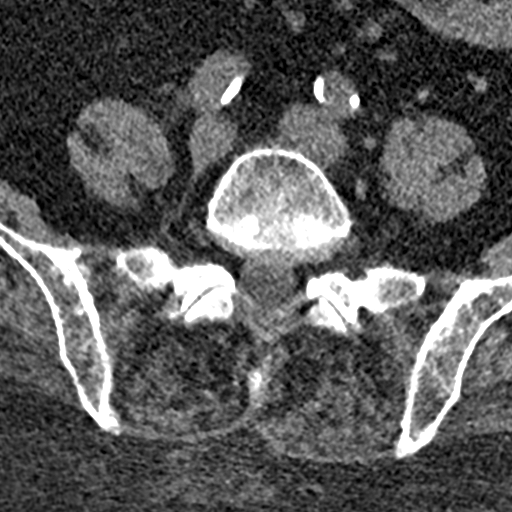
[im 15/94  bone]
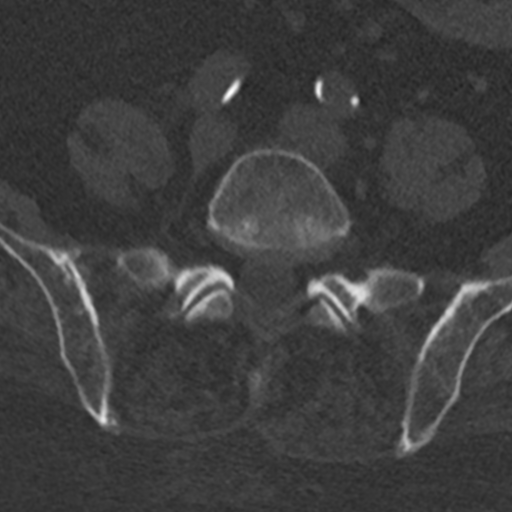
[im 29/94  bone]
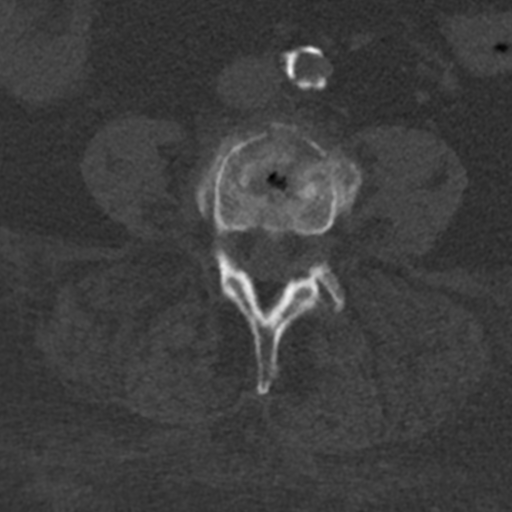
[im 43/94  bone]
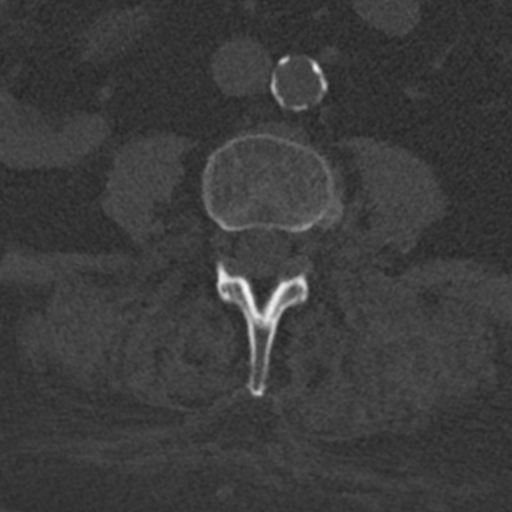
[im 58/94  bone]
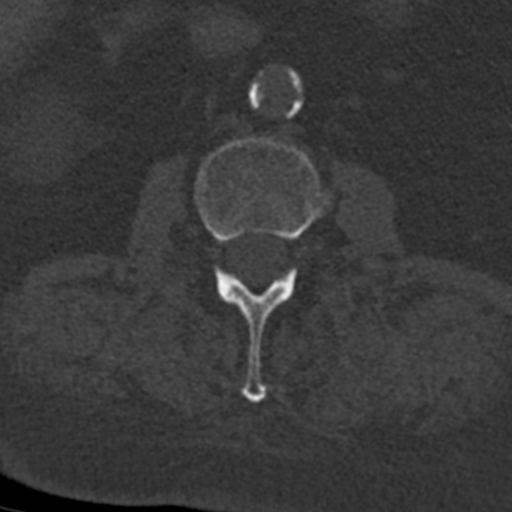
[im 72/94  soft-tissue]
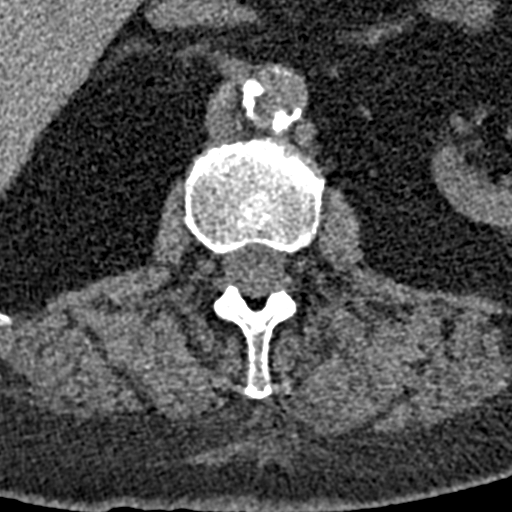
[im 72/94  bone]
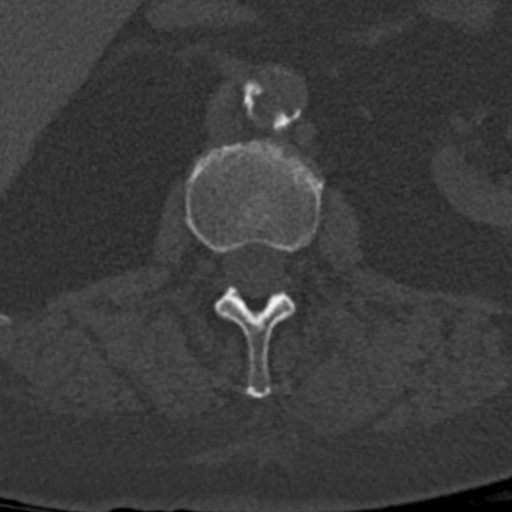
[im 86/94  bone]
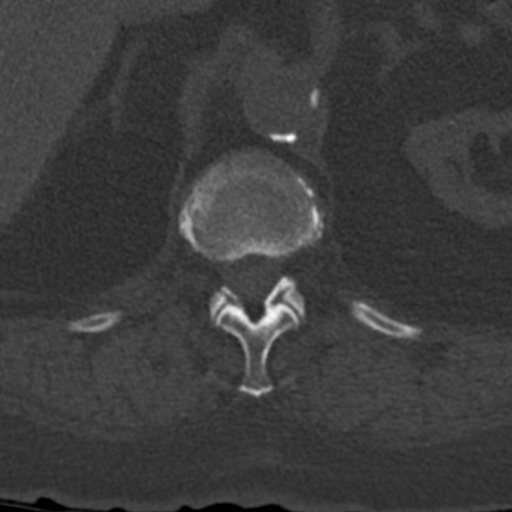

[Series 5: sagittal bone · sagittal · 0.30mm/px · 5 of 61 slices shown, 6 images]
[im 21/61  bone]
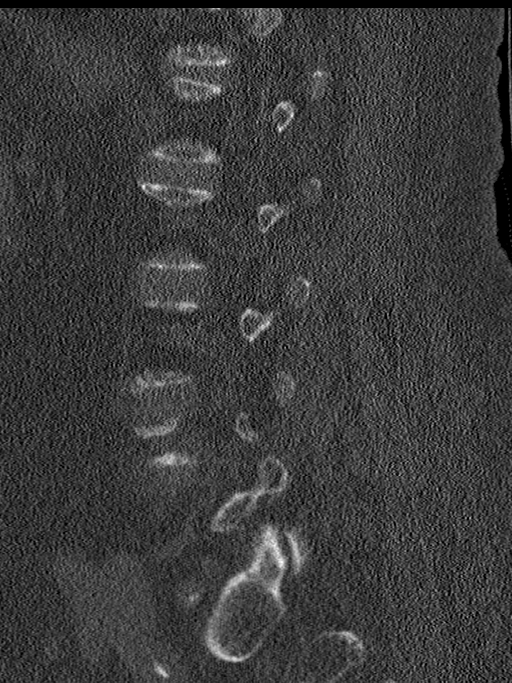
[im 26/61  bone]
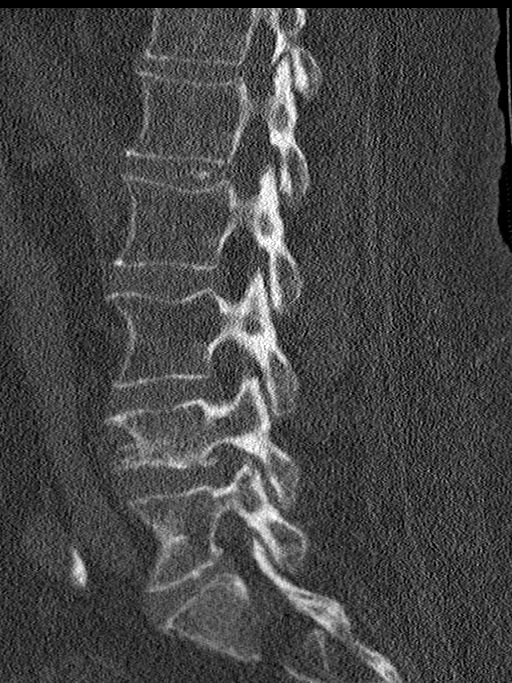
[im 31/61  soft-tissue]
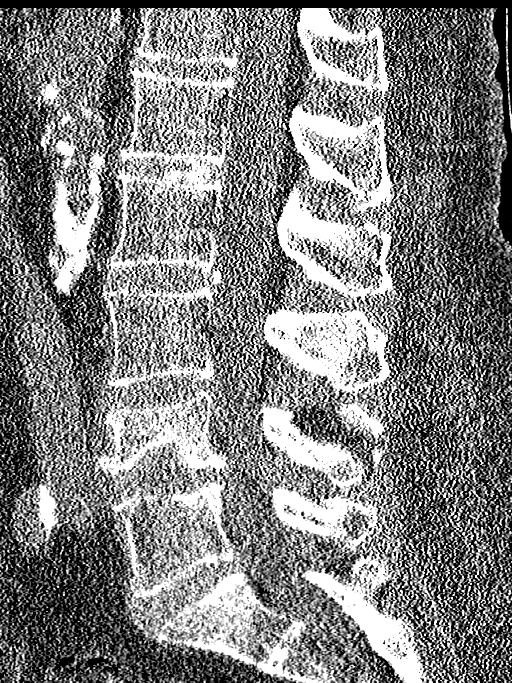
[im 31/61  bone]
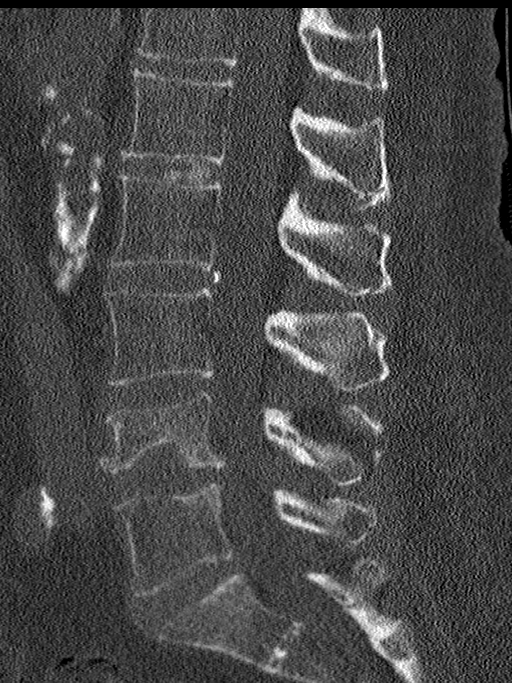
[im 36/61  bone]
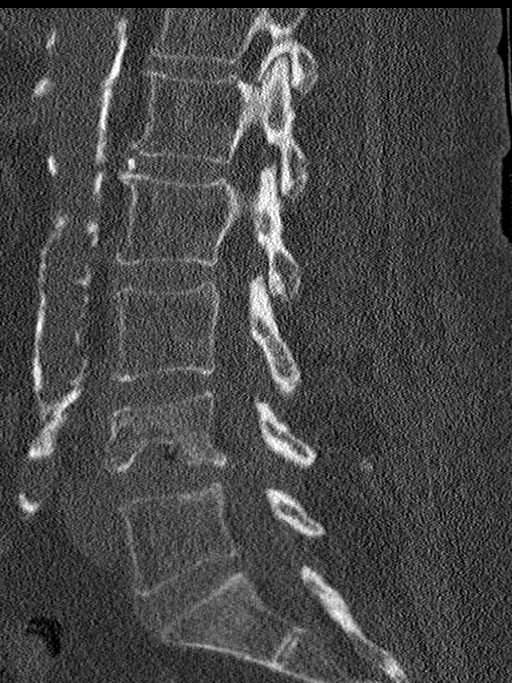
[im 41/61  bone]
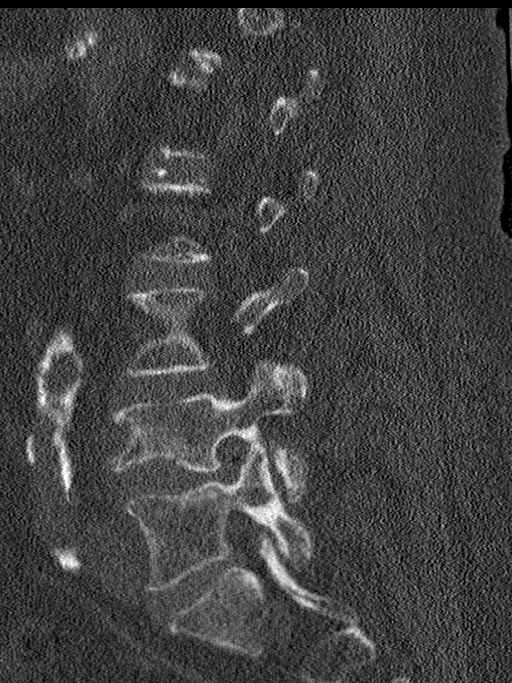

[Series 6: coronal bone · coronal · 0.30mm/px · 3 of 71 slices shown]
[im 15/71  bone]
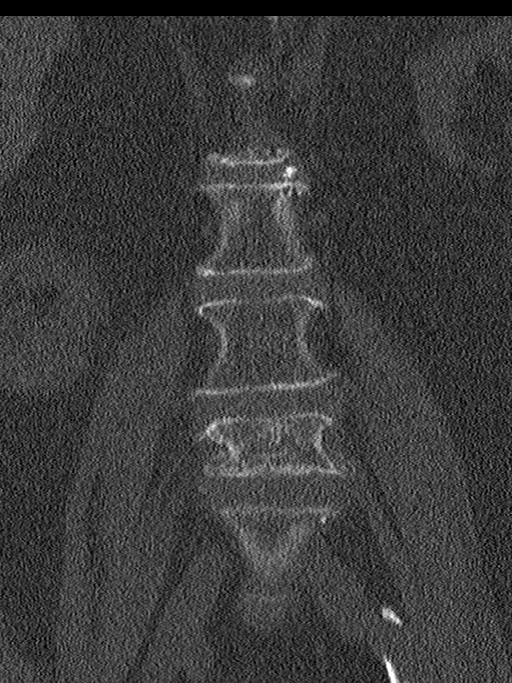
[im 29/71  bone]
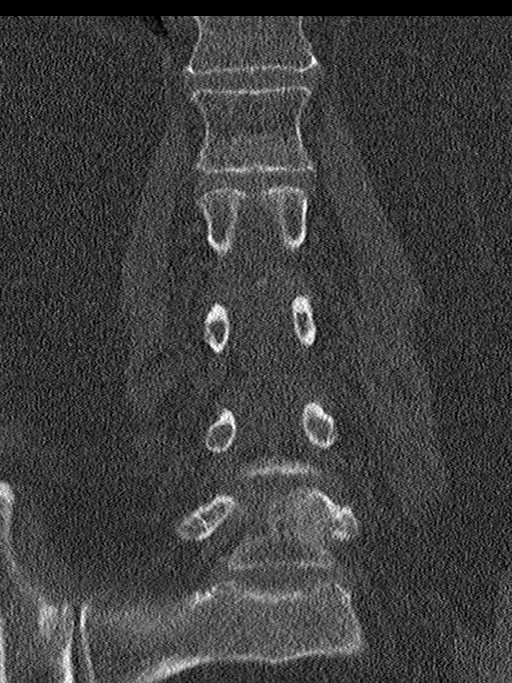
[im 43/71  bone]
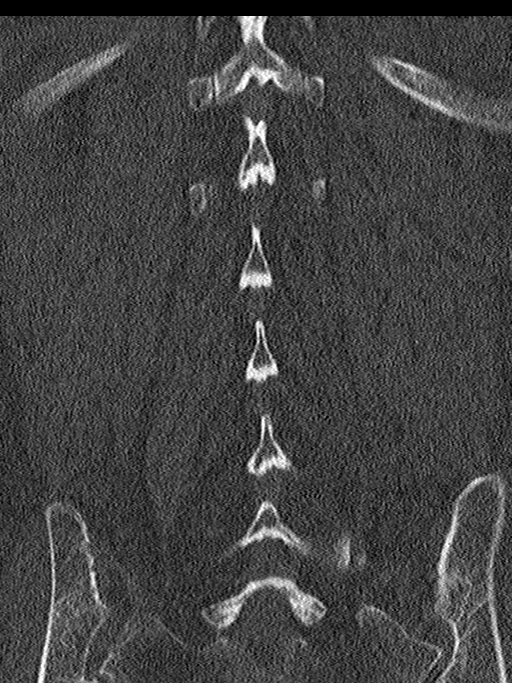

[14 of 33 positions shown; findings below may reference images not displayed]

FINDINGS: Segmentation: 5 lumbar type vertebrae.

Alignment: Normal.

Vertebrae: L4 vertebral body compression fracture with approximately
60% height loss.

Paraspinal and other soft tissues: No acute paraspinal abnormality.
Abdominal aortic atherosclerosis.

Disc levels: No foraminal stenosis. Mild broad-based disc osteophyte
complex at L4-5.
IMPRESSION: 1. Acute versus subacute L4 vertebral body compression fracture with
approximately 60% height loss.
2.  Aortic Atherosclerosis ([id number]-[id number]).

## 2020-07-16 ENCOUNTER — Other Ambulatory Visit: Payer: Self-pay

## 2020-07-16 ENCOUNTER — Ambulatory Visit: Payer: Medicare Other | Admitting: Orthopaedic Surgery

## 2020-10-19 ENCOUNTER — Telehealth: Payer: Self-pay | Admitting: Cardiovascular Disease

## 2020-10-19 NOTE — Telephone Encounter (Signed)
3 attempts to schedule fu appt from recall list.   Deleting recall.   

## 2021-09-03 ENCOUNTER — Telehealth: Payer: Self-pay

## 2021-09-03 NOTE — Telephone Encounter (Signed)
Spoke with patient asked about if she was would be willing to come in for an appointment as she is not having her device followed any where else. Patient stated that she was not going to wear a mask,asked patient if she would be willing to send in remote transmissions if I sent her a new monitor patient stated that she was willing to do that.   A [employer] request was sent to [facility] to contact patient as the automated default for a home monitor was a 2500 and patient cant use an app based remote device.

## 2022-05-13 ENCOUNTER — Telehealth: Payer: Self-pay | Admitting: *Deleted

## 2022-05-13 NOTE — Patient Outreach (Signed)
  Care Coordination   05/13/2022 Name: Teresa Mercado MRN: [id number] DOB: Mar 22, 1946   Care Coordination Outreach Attempts:  An unsuccessful telephone outreach was attempted today to offer the patient information about available care coordination services as a benefit of their health plan.   Follow Up Plan:  Additional outreach attempts will be made to offer the patient care coordination information and services.   Encounter Outcome:  No Answer  Care Coordination Interventions Activated:  No   Care Coordination Interventions:  No, not indicated    Jacqlyn Larsen [facility], BSN RN Care Coordinator [phone]

## 2022-06-06 ENCOUNTER — Telehealth: Payer: Self-pay | Admitting: *Deleted

## 2022-06-06 NOTE — Patient Outreach (Signed)
  Care Coordination   06/06/2022 Name: Teresa Mercado MRN: [id number] DOB: October 25, 1945   Care Coordination Outreach Attempts:  A second unsuccessful outreach was attempted today to offer the patient with information about available care coordination services as a benefit of their health plan.     Follow Up Plan:  Additional outreach attempts will be made to offer the patient care coordination information and services.   Encounter Outcome:  No Answer  Care Coordination Interventions Activated:  No   Care Coordination Interventions:  No, not indicated    Jacqlyn Larsen [facility], [place] RN Care Coordinator [phone]

## 2022-06-29 ENCOUNTER — Telehealth: Payer: Self-pay | Admitting: *Deleted

## 2022-06-29 NOTE — Patient Outreach (Signed)
  Care Coordination   Initial Visit Note   06/29/2022 Name: Teresa Mercado MRN: [id number] DOB: [birth year]  Teresa Mercado is a 76 y.o. year old female who sees Sparks, Teresa Douglas, MD for primary care. I spoke with  Teresa Mercado by phone today.  What matters to the patients health and wellness today?  I don't need what ever you have to offer.    Goals Addressed   None     SDOH assessments and interventions completed:  Yes  SDOH Interventions Today    Flowsheet Row Most Recent Value  SDOH Interventions   Food Insecurity Interventions Intervention Not Indicated  Housing Interventions Intervention Not Indicated  Transportation Interventions Intervention Not Indicated        Care Coordination Interventions Activated:  No  Care Coordination Interventions:  No, not indicated    Follow up plan: No further intervention required.   Encounter Outcome:  Pt. Refused   Benna Arno C. Myrtie Neither, MSN, [facility] Gerontological Nurse Practitioner [facility] Care Management [phone]

## 2023-04-01 ENCOUNTER — Other Ambulatory Visit: Payer: Self-pay

## 2023-04-01 ENCOUNTER — Emergency Department (HOSPITAL_COMMUNITY): Payer: Medicare Other

## 2023-04-01 ENCOUNTER — Encounter (HOSPITAL_COMMUNITY): Payer: Self-pay

## 2023-04-01 ENCOUNTER — Inpatient Hospital Stay (HOSPITAL_COMMUNITY)
Admission: EM | Admit: 2023-04-01 | Discharge: 2023-04-04 | DRG: 189 | Disposition: A | Discharge status: Home / Self Care | Payer: Medicare Other | Attending: Internal Medicine | Admitting: Internal Medicine

## 2023-04-01 DIAGNOSIS — I5032 Chronic diastolic (congestive) heart failure: Secondary | ICD-10-CM | POA: Diagnosis not present

## 2023-04-01 DIAGNOSIS — I130 Hypertensive heart and chronic kidney disease with heart failure and stage 1 through stage 4 chronic kidney disease, or unspecified chronic kidney disease: Secondary | ICD-10-CM | POA: Diagnosis present

## 2023-04-01 DIAGNOSIS — I5042 Chronic combined systolic (congestive) and diastolic (congestive) heart failure: Secondary | ICD-10-CM | POA: Diagnosis present

## 2023-04-01 DIAGNOSIS — Z888 Allergy status to other drugs, medicaments and biological substances status: Secondary | ICD-10-CM

## 2023-04-01 DIAGNOSIS — R079 Chest pain, unspecified: Secondary | ICD-10-CM | POA: Diagnosis present

## 2023-04-01 DIAGNOSIS — E1122 Type 2 diabetes mellitus with diabetic chronic kidney disease: Secondary | ICD-10-CM | POA: Diagnosis present

## 2023-04-01 DIAGNOSIS — I5022 Chronic systolic (congestive) heart failure: Secondary | ICD-10-CM | POA: Insufficient documentation

## 2023-04-01 DIAGNOSIS — Z800 Family history of malignant neoplasm of digestive organs: Secondary | ICD-10-CM | POA: Diagnosis not present

## 2023-04-01 DIAGNOSIS — R0602 Shortness of breath: Secondary | ICD-10-CM | POA: Diagnosis present

## 2023-04-01 DIAGNOSIS — N179 Acute kidney failure, unspecified: Secondary | ICD-10-CM | POA: Diagnosis present

## 2023-04-01 DIAGNOSIS — E782 Mixed hyperlipidemia: Secondary | ICD-10-CM | POA: Diagnosis present

## 2023-04-01 DIAGNOSIS — J432 Centrilobular emphysema: Secondary | ICD-10-CM | POA: Diagnosis present

## 2023-04-01 DIAGNOSIS — Z950 Presence of cardiac pacemaker: Secondary | ICD-10-CM | POA: Diagnosis not present

## 2023-04-01 DIAGNOSIS — N183 Chronic kidney disease, stage 3 unspecified: Secondary | ICD-10-CM | POA: Diagnosis present

## 2023-04-01 DIAGNOSIS — E669 Obesity, unspecified: Secondary | ICD-10-CM | POA: Diagnosis present

## 2023-04-01 DIAGNOSIS — I2510 Atherosclerotic heart disease of native coronary artery without angina pectoris: Secondary | ICD-10-CM | POA: Diagnosis present

## 2023-04-01 DIAGNOSIS — Z794 Long term (current) use of insulin: Secondary | ICD-10-CM

## 2023-04-01 DIAGNOSIS — J438 Other emphysema: Secondary | ICD-10-CM | POA: Diagnosis present

## 2023-04-01 DIAGNOSIS — Z7982 Long term (current) use of aspirin: Secondary | ICD-10-CM

## 2023-04-01 DIAGNOSIS — Z6835 Body mass index (BMI) 35.0-35.9, adult: Secondary | ICD-10-CM | POA: Diagnosis not present

## 2023-04-01 DIAGNOSIS — N200 Calculus of kidney: Secondary | ICD-10-CM | POA: Diagnosis present

## 2023-04-01 DIAGNOSIS — R0789 Other chest pain: Secondary | ICD-10-CM | POA: Diagnosis present

## 2023-04-01 DIAGNOSIS — R0902 Hypoxemia: Principal | ICD-10-CM

## 2023-04-01 DIAGNOSIS — N1832 Chronic kidney disease, stage 3b: Secondary | ICD-10-CM | POA: Diagnosis present

## 2023-04-01 DIAGNOSIS — I442 Atrioventricular block, complete: Secondary | ICD-10-CM | POA: Diagnosis present

## 2023-04-01 DIAGNOSIS — E1165 Type 2 diabetes mellitus with hyperglycemia: Secondary | ICD-10-CM | POA: Diagnosis present

## 2023-04-01 DIAGNOSIS — J9621 Acute and chronic respiratory failure with hypoxia: Principal | ICD-10-CM | POA: Diagnosis present

## 2023-04-01 DIAGNOSIS — Z87891 Personal history of nicotine dependence: Secondary | ICD-10-CM | POA: Diagnosis not present

## 2023-04-01 DIAGNOSIS — Z8051 Family history of malignant neoplasm of kidney: Secondary | ICD-10-CM

## 2023-04-01 DIAGNOSIS — Z79899 Other long term (current) drug therapy: Secondary | ICD-10-CM

## 2023-04-01 DIAGNOSIS — Z9181 History of falling: Secondary | ICD-10-CM

## 2023-04-01 DIAGNOSIS — I10 Essential (primary) hypertension: Secondary | ICD-10-CM | POA: Diagnosis not present

## 2023-04-01 LAB — COMPREHENSIVE METABOLIC PANEL
ALT: 22 U/L (ref 0–44)
AST: 22 U/L (ref 15–41)
Albumin: 3.4 g/dL — ABNORMAL LOW (ref 3.5–5.0)
Alkaline Phosphatase: 68 U/L (ref 38–126)
Anion gap: 11 (ref 5–15)
BUN: 70 mg/dL — ABNORMAL HIGH (ref 8–23)
CO2: 24 mmol/L (ref 22–32)
Calcium: 9.1 mg/dL (ref 8.9–10.3)
Chloride: 104 mmol/L (ref 98–111)
Creatinine, Ser: 1.93 mg/dL — ABNORMAL HIGH (ref 0.44–1.00)
GFR, Estimated: 26 mL/min — ABNORMAL LOW (ref >60)
Glucose, Bld: 150 mg/dL — ABNORMAL HIGH (ref 70–99)
Potassium: 4.4 mmol/L (ref 3.5–5.1)
Sodium: 139 mmol/L (ref 135–145)
Total Bilirubin: 0.6 mg/dL (ref 0.3–1.2)
Total Protein: 7.4 g/dL (ref 6.5–8.1)

## 2023-04-01 LAB — CBC WITH DIFFERENTIAL/PLATELET
Abs Immature Granulocytes: 0.05 10*3/uL (ref 0.00–0.07)
Basophils Absolute: 0 10*3/uL (ref 0.0–0.1)
Basophils Relative: 0 %
Eosinophils Absolute: 0.3 10*3/uL (ref 0.0–0.5)
Eosinophils Relative: 4 %
HCT: 38.7 % (ref 36.0–46.0)
Hemoglobin: 12.1 g/dL (ref 12.0–15.0)
Immature Granulocytes: 1 %
Lymphocytes Relative: 18 %
Lymphs Abs: 1.5 10*3/uL (ref 0.7–4.0)
MCH: 29.6 pg (ref 26.0–34.0)
MCHC: 31.3 g/dL (ref 30.0–36.0)
MCV: 94.6 fL (ref 80.0–100.0)
Monocytes Absolute: 0.7 10*3/uL (ref 0.1–1.0)
Monocytes Relative: 8 %
Neutro Abs: 5.7 10*3/uL (ref 1.7–7.7)
Neutrophils Relative %: 69 %
Platelets: 239 10*3/uL (ref 150–400)
RBC: 4.09 MIL/uL (ref 3.87–5.11)
RDW: 15 % (ref 11.5–15.5)
WBC: 8.3 10*3/uL (ref 4.0–10.5)
nRBC: 0 % (ref 0.0–0.2)

## 2023-04-01 LAB — GLUCOSE, CAPILLARY
Glucose-Capillary: 220 mg/dL — ABNORMAL HIGH (ref 70–99)
Glucose-Capillary: 175 mg/dL — ABNORMAL HIGH (ref 70–99)

## 2023-04-01 LAB — D-DIMER, QUANTITATIVE: D-Dimer, Quant: 5.52 ug/mL-FEU — ABNORMAL HIGH (ref 0.00–0.50)

## 2023-04-01 LAB — HEPARIN LEVEL (UNFRACTIONATED): Heparin Unfractionated: 0.96 IU/mL — ABNORMAL HIGH (ref 0.30–0.70)

## 2023-04-01 LAB — TROPONIN I (HIGH SENSITIVITY)
Troponin I (High Sensitivity): 10 ng/L (ref <18)
Troponin I (High Sensitivity): 9 ng/L (ref <18)

## 2023-04-01 LAB — BRAIN NATRIURETIC PEPTIDE: B Natriuretic Peptide: 137 pg/mL — ABNORMAL HIGH (ref 0.0–100.0)

## 2023-04-01 MED ORDER — ASPIRIN 81 MG PO TBEC
81.0000 mg | DELAYED_RELEASE_TABLET | Freq: Every day | ORAL | Status: DC
  Administered 2023-04-01 – 2023-04-04 (×4): 81 mg via ORAL
  Filled 2023-04-01 (×4): qty 1

## 2023-04-01 MED ORDER — HEPARIN (PORCINE) 25000 UT/250ML-% IV SOLN
850.0000 [IU]/h | INTRAVENOUS | Status: DC
  Administered 2023-04-01: 1200 [IU]/h via INTRAVENOUS
  Administered 2023-04-02: 1050 [IU]/h via INTRAVENOUS
  Filled 2023-04-01 (×2): qty 250

## 2023-04-01 MED ORDER — INSULIN ASPART 100 UNIT/ML IJ SOLN
16.0000 [IU] | Freq: Two times a day (BID) | INTRAMUSCULAR | Status: DC
  Administered 2023-04-01 – 2023-04-02 (×2): 16 [IU] via SUBCUTANEOUS

## 2023-04-01 MED ORDER — TECHNETIUM TO 99M ALBUMIN AGGREGATED
4.0000 | Freq: Once | INTRAVENOUS | Status: AC | PRN
  Administered 2023-04-01: 4 via INTRAVENOUS

## 2023-04-01 MED ORDER — INSULIN GLARGINE-YFGN 100 UNIT/ML ~~LOC~~ SOLN
10.0000 [IU] | Freq: Two times a day (BID) | SUBCUTANEOUS | Status: DC
Start: 2023-04-01 — End: 2023-04-01

## 2023-04-01 MED ORDER — INSULIN ASPART 100 UNIT/ML IJ SOLN: 0.0000 [IU] | Freq: Every day | INTRAMUSCULAR | Status: DC

## 2023-04-01 MED ORDER — HEPARIN BOLUS VIA INFUSION
4500.0000 [IU] | Freq: Once | INTRAVENOUS | Status: AC
  Administered 2023-04-01: 4500 [IU] via INTRAVENOUS

## 2023-04-01 MED ORDER — INSULIN GLARGINE-YFGN 100 UNIT/ML ~~LOC~~ SOLN
10.0000 [IU] | Freq: Every day | SUBCUTANEOUS | Status: DC
  Administered 2023-04-01 – 2023-04-03 (×3): 10 [IU] via SUBCUTANEOUS
  Filled 2023-04-01 (×4): qty 0.1

## 2023-04-01 MED ORDER — INSULIN GLARGINE-YFGN 100 UNIT/ML ~~LOC~~ SOLN
20.0000 [IU] | Freq: Every morning | SUBCUTANEOUS | Status: DC
  Administered 2023-04-02 – 2023-04-04 (×3): 20 [IU] via SUBCUTANEOUS
  Filled 2023-04-01 (×5): qty 0.2

## 2023-04-01 MED ORDER — SODIUM CHLORIDE 0.9 % IV SOLN: INTRAVENOUS | Status: DC

## 2023-04-01 MED ORDER — ONDANSETRON HCL 4 MG/2ML IJ SOLN
4.0000 mg | Freq: Once | INTRAMUSCULAR | Status: AC
  Administered 2023-04-01: 4 mg via INTRAVENOUS
  Filled 2023-04-01: qty 2

## 2023-04-01 MED ORDER — HYDROMORPHONE HCL 1 MG/ML IJ SOLN
0.5000 mg | Freq: Once | INTRAMUSCULAR | Status: AC
  Administered 2023-04-01: 0.5 mg via INTRAVENOUS
  Filled 2023-04-01: qty 0.5

## 2023-04-01 MED ORDER — INSULIN ASPART 100 UNIT/ML IJ SOLN
0.0000 [IU] | Freq: Three times a day (TID) | INTRAMUSCULAR | Status: DC
  Administered 2023-04-02 (×2): 1 [IU] via SUBCUTANEOUS
  Administered 2023-04-03: 2 [IU] via SUBCUTANEOUS

## 2023-04-01 MED ORDER — ATORVASTATIN CALCIUM 40 MG PO TABS
80.0000 mg | ORAL_TABLET | Freq: Every day | ORAL | Status: DC
  Administered 2023-04-01 – 2023-04-04 (×4): 80 mg via ORAL
  Filled 2023-04-01 (×4): qty 2

## 2023-04-01 MED ORDER — SODIUM CHLORIDE 0.9 % IV BOLUS: 1000.0000 mL | Freq: Once | INTRAVENOUS | Status: DC

## 2023-04-01 MED ORDER — ONDANSETRON HCL 4 MG/2ML IJ SOLN: 4.0000 mg | Freq: Four times a day (QID) | INTRAMUSCULAR | Status: DC | PRN

## 2023-04-01 NOTE — ED Notes (Signed)
ED TO INPATIENT HANDOFF REPORT  ED Nurse Name and Phone #: [phone]   S Name/Age/Gender Teresa Mercado 77 y.o. female Room/Bed: APA02/APA02  Code Status   Code Status: Prior  Home/SNF/Other Home Patient oriented to: self, place, time, and situation Is this baseline? Yes   Triage Complete: Triage complete  Chief Complaint SOB (shortness of breath) [R06.02]  Triage Note Pt BIB [facility] from home C/O SOB and L sided back pain that sometimes radiates to L ribcage. Reports pain has been going on for about a week, no known injury. Also reports some coughing for about 1 week.    Allergies Allergies  Allergen Reactions   Quinapril Other (See Comments)    Reaction:  Elevated potassium    Venlafaxine Other (See Comments)    Reaction:  GI upset    Zetia [Ezetimibe] Nausea And Vomiting    Level of Care/Admitting Diagnosis ED Disposition     ED Disposition  Admit   Condition  --   Comment  Hospital Area: [facility] [[id number]]  Level of Care: Med-Surg [16]  Covid Evaluation: Asymptomatic - no recent exposure (last 10 days) testing not required  Diagnosis: SOB (shortness of breath) [[id number]]  Admitting Physician: Levie Heritage [[id number]]  Attending Physician: Levie Heritage [[id number]]  Certification:: I certify this patient will need inpatient services for at least 2 midnights  Estimated Length of Stay: 2          B Medical/Surgery History Past Medical History:  Diagnosis Date   Chronic combined systolic (congestive) and diastolic (congestive) heart failure (HCC)    a. 09/2015 Echo: normal EF 55%; b. 07/2017 Echo: EF 25%, Gr2 DD, mild lvh, mid ant/inf/infsept/apical AK. Triv MR. mildly to mod dil LA.   CKD (chronic kidney disease), stage III (HCC)    COPD (chronic obstructive pulmonary disease) (HCC)    Not on home o2   Diabetes mellitus without complication (HCC)    On Levemir   Non-ischemic cardiomyopathy (HCC)    a. 07/2017 Echo: EF 25%, Gr2 DD - ? takotsubo.    Non-obstructive CAD (coronary artery disease)    a. 07/2017 Cath: LM nl, LAD 40ost, D1 50, D2 40, LCX 65m, OM1/2 nl, RCA large, nl.   Syncope    a.  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   Past Surgical History:  Procedure Laterality Date   APPENDECTOMY     ESOPHAGOGASTRODUODENOSCOPY N/A 11/27/2015   Procedure: ESOPHAGOGASTRODUODENOSCOPY (EGD);  Surgeon: Christena Deem, MD;  Location: [facility] ENDOSCOPY;  Service: Endoscopy;  Laterality: N/A;   KYPHOPLASTY N/A 02/21/2019   Procedure: KYPHOPLASTY L4 - DIABETIC;  Surgeon: Kennedy Bucker, MD;  Location: [facility] ORS;  Service: Orthopedics;  Laterality: N/A;   LEFT HEART CATH AND CORONARY ANGIOGRAPHY N/A 07/21/2017   Procedure: LEFT HEART CATH AND CORONARY ANGIOGRAPHY;  Surgeon: Yvonne Kendall, MD;  Location: [facility] INVASIVE CV LAB;  Service: Cardiovascular;  Laterality: N/A;   PACEMAKER INSERTION     TONSILLECTOMY     TUBAL LIGATION       A IV Location/Drains/Wounds Patient Lines/Drains/Airways Status     Active Line/Drains/Airways     Name Placement date Placement time Site Days   Peripheral IV 04/01/23 20 G Right Antecubital 04/01/23  0815  Antecubital  less than 1   Airway --  --  -- --   Incision (Closed) 02/21/19 Back 02/21/19  2054  -- 1500            Intake/Output Last  24 hours No intake or output data in the 24 hours ending 04/01/23 1556  Labs/Imaging Results for orders placed or performed during the hospital encounter of 04/01/23 (from the past 48 hour(s))  CBC with Differential     Status: None   Collection Time: 04/01/23  8:14 AM  Result Value Ref Range   WBC 8.3 4.0 - 10.5 K/uL   RBC 4.09 3.87 - 5.11 MIL/uL   Hemoglobin 12.1 12.0 - 15.0 g/dL   HCT 16.1 09.6 - 04.5 %   MCV 94.6 80.0 - 100.0 fL   MCH 29.6 26.0 - 34.0 pg   MCHC 31.3 30.0 - 36.0 g/dL   RDW 40.9 81.1 - 91.4 %   Platelets 239 150 - 400 K/uL   nRBC 0.0 0.0 - 0.2 %   Neutrophils Relative % 69 %   Neutro Abs 5.7 1.7 - 7.7 K/uL    Lymphocytes Relative 18 %   Lymphs Abs 1.5 0.7 - 4.0 K/uL   Monocytes Relative 8 %   Monocytes Absolute 0.7 0.1 - 1.0 K/uL   Eosinophils Relative 4 %   Eosinophils Absolute 0.3 0.0 - 0.5 K/uL   Basophils Relative 0 %   Basophils Absolute 0.0 0.0 - 0.1 K/uL   Immature Granulocytes 1 %   Abs Immature Granulocytes 0.05 0.00 - 0.07 K/uL    Comment: Performed at [facility], [address] [place], [place] [address]  Comprehensive metabolic panel     Status: Abnormal   Collection Time: 04/01/23  8:14 AM  Result Value Ref Range   Sodium 139 135 - 145 mmol/L   Potassium 4.4 3.5 - 5.1 mmol/L   Chloride 104 98 - 111 mmol/L   CO2 24 22 - 32 mmol/L   Glucose, Bld 150 (H) 70 - 99 mg/dL    Comment: Glucose reference range applies only to samples taken after fasting for at least 8 hours.   BUN 70 (H) 8 - 23 mg/dL   Creatinine, Ser 6.21 (H) 0.44 - 1.00 mg/dL   Calcium 9.1 8.9 - 30.8 mg/dL   Total Protein 7.4 6.5 - 8.1 g/dL   Albumin 3.4 (L) 3.5 - 5.0 g/dL   AST 22 15 - 41 U/L   ALT 22 0 - 44 U/L   Alkaline Phosphatase 68 38 - 126 U/L   Total Bilirubin 0.6 0.3 - 1.2 mg/dL   GFR, Estimated 26 (L) >60 mL/min    Comment: (NOTE) Calculated using the CKD-EPI Creatinine Equation (2021)    Anion gap 11 5 - 15    Comment: Performed at [facility], [address] [place], [place] [address]  Troponin I (High Sensitivity)     Status: None   Collection Time: 04/01/23  8:14 AM  Result Value Ref Range   Troponin I (High Sensitivity) 10 <18 ng/L    Comment: (NOTE) Elevated high sensitivity troponin I (hsTnI) values and significant  changes across serial measurements may suggest ACS but many other  chronic and acute conditions are known to elevate hsTnI results.  Refer to the "Links" section for chest pain algorithms and additional  guidance. Performed at [facility], [address] [place], [place] [address]   Brain natriuretic peptide     Status: Abnormal   Collection Time: 04/01/23  8:14 AM   Result Value Ref Range   B Natriuretic Peptide 137.0 (H) 0.0 - 100.0 pg/mL    Comment: Performed at [facility], [address], [place], [place] [address]  D-dimer, quantitative  Status: Abnormal   Collection Time: 04/01/23  9:50 AM  Result Value Ref Range   D-Dimer, Quant 5.52 (H) 0.00 - 0.50 ug/mL-FEU    Comment: (NOTE) At the manufacturer cut-off value of 0.5 g/mL FEU, this assay has a negative predictive value of 95-100%.This assay is intended for use in conjunction with a clinical pretest probability (PTP) assessment model to exclude pulmonary embolism (PE) and deep venous thrombosis (DVT) in outpatients suspected of PE or DVT. Results should be correlated with clinical presentation. Performed at [facility], [address] [place], [place] [address]   Troponin I (High Sensitivity)     Status: None   Collection Time: 04/01/23 10:15 AM  Result Value Ref Range   Troponin I (High Sensitivity) 9 <18 ng/L    Comment: (NOTE) Elevated high sensitivity troponin I (hsTnI) values and significant  changes across serial measurements may suggest ACS but many other  chronic and acute conditions are known to elevate hsTnI results.  Refer to the "Links" section for chest pain algorithms and additional  guidance. Performed at [facility], [address], [place], [place] [address]    NM Pulmonary Perfusion  Result Date: 04/01/2023 CLINICAL DATA:  Shortness of breath left-sided chest pain EXAM: NUCLEAR MEDICINE PERFUSION LUNG SCAN TECHNIQUE: Perfusion images were obtained in multiple projections after intravenous injection of radiopharmaceutical. Ventilation scans intentionally deferred if perfusion scan and chest x-ray adequate for interpretation during COVID 19 epidemic. RADIOPHARMACEUTICALS:  4.0 mCi Tc-39m MAA IV COMPARISON:  Chest x-ray 04/01/2023 FINDINGS: Nonsegmental round defect in the left mid chest inconsistently visualized. No significant wedge-shaped perfusion defects are  visualized. IMPRESSION: No significant wedge-shaped perfusion defects. Pulmonary embolism absent per Pisaped criteria Electronically Signed   By: Jasmine Pang M.D.   On: 04/01/2023 15:35   CT Chest Wo Contrast  Result Date: 04/01/2023 CLINICAL DATA:  Chronic shortness of breath. Left-sided back pain radiating to the ribs for the past week. No injury. EXAM: CT CHEST WITHOUT CONTRAST TECHNIQUE: Multidetector CT imaging of the chest was performed following the standard protocol without IV contrast. RADIATION DOSE REDUCTION: This exam was performed according to the departmental dose-optimization program which includes automated exposure control, adjustment of the mA and/or kV according to patient size and/or use of iterative reconstruction technique. COMPARISON:  Chest x-ray from same day. FINDINGS: Cardiovascular: Mild cardiomegaly. No pericardial effusion. Dense calcification of the aortic and mitral valves. No thoracic aortic aneurysm. Coronary, aortic arch, and branch vessel atherosclerotic vascular disease. Left chest wall pacemaker. Mediastinum/Nodes: No enlarged mediastinal or axillary lymph nodes. Thyroid gland, trachea, and esophagus demonstrate no significant findings. Lungs/Pleura: Mild centrilobular and paraseptal emphysema. Mild bibasilar atelectasis and linear scarring. No focal consolidation, pleural effusion, or pneumothorax. No suspicious pulmonary nodule. Upper Abdomen: No acute abnormality. Chronic left renal atrophy and dystrophic calcification in the lower pole. Musculoskeletal: No acute or significant osseous findings. IMPRESSION: 1. No acute intrathoracic process. 2. Aortic Atherosclerosis (ICD10-I70.0) and Emphysema (ICD10-J43.9). Electronically Signed   By: Obie Dredge M.D.   On: 04/01/2023 10:50   DG Chest Port 1 View  Result Date: 04/01/2023 CLINICAL DATA:  Shortness of breath. Left-sided chest and back pain for 1 week. EXAM: PORTABLE CHEST 1 VIEW COMPARISON:  09/20/2018 FINDINGS:  Stable cardiomegaly. Permanent pacemaker remains in appropriate position. Mild bibasilar scarring remains stable. No evidence of acute infiltrate or edema. No evidence of pleural effusion. IMPRESSION: Stable mild cardiomegaly and bibasilar scarring.  No active disease. Electronically Signed   By: Dietrich Pates.D.  On: 04/01/2023 09:30    Pending Labs Unresulted Labs (From admission, onward)     Start     Ordered   04/02/23 0500  Heparin level (unfractionated)  Daily,   R      04/01/23 1123   04/02/23 0500  CBC  Daily,   R      04/01/23 1123   04/01/23 2000  Heparin level (unfractionated)  Once-Timed,   URGENT        04/01/23 1116            Vitals/Pain Today's Vitals   04/01/23 1145 04/01/23 1215 04/01/23 1300 04/01/23 1345  BP: 124/66 (!) 118/55 119/73 (!) 141/76  Pulse: 70 67 68 74  Resp: 12 (!) 21 20 20   Temp:  98.2 F (36.8 C)    TempSrc:      SpO2: 96% 95% 95% 98%  Weight:      Height:      PainSc:        Isolation Precautions No active isolations  Medications Medications  heparin ADULT infusion 100 units/mL (25000 units/242mL) (1,200 Units/hr Intravenous New Bag/Given 04/01/23 1121)  ondansetron (ZOFRAN) injection 4 mg (has no administration in time range)  0.9 %  sodium chloride infusion (has no administration in time range)  HYDROmorphone (DILAUDID) injection 0.5 mg (0.5 mg Intravenous Given 04/01/23 0832)  ondansetron (ZOFRAN) injection 4 mg (4 mg Intravenous Given 04/01/23 0832)  heparin bolus via infusion 4,500 Units (4,500 Units Intravenous Bolus from Bag 04/01/23 1121)  technetium albumin aggregated (MAA) injection solution 4 millicurie (4 millicuries Intravenous Contrast Given 04/01/23 1410)    Mobility walks with device     Focused Assessments Cardiac Assessment Handoff:    Lab Results  Component Value Date   TROPONINI <0.03 09/21/2018   Lab Results  Component Value Date   DDIMER 5.52 (H) 04/01/2023   Does the Patient currently have chest  pain? No    R Recommendations: See Admitting Provider Note  Report given to:   Additional Notes:

## 2023-04-01 NOTE — Progress Notes (Signed)
ANTICOAGULATION CONSULT NOTE  Pharmacy Consult for Heparin Indication: pulmonary embolus  Allergies  Allergen Reactions   Quinapril Other (See Comments)    Reaction:  Elevated potassium    Venlafaxine Other (See Comments)    Reaction:  GI upset    Zetia [Ezetimibe] Nausea And Vomiting    Patient Measurements: Height: 5\' 3"  (160 cm) Weight: 91.3 kg (201 lb 4.5 oz) IBW/kg (Calculated) : 52.4 Heparin Dosing Weight: 76 kg  Vital Signs: Temp: 97.6 F (36.4 C) (07/13 2025) Temp Source: Oral (07/13 2025) BP: 106/61 (07/13 2025) Pulse Rate: 78 (07/13 2025)  Labs: Recent Labs    04/01/23 0814 04/01/23 1015 04/01/23 1959  HGB 12.1  --   --   HCT 38.7  --   --   PLT 239  --   --   HEPARINUNFRC  --   --  0.96*  CREATININE 1.93*  --   --   TROPONINIHS 10 9  --     Estimated Creatinine Clearance: 25.8 mL/min (A) (by C-G formula based on SCr of 1.93 mg/dL (H)).  Assessment: 77 yoM hx CAD, DM, HF, CKD admitted with SOB. Pharmacy consulted to initiate systemic heparin therapy for suspected pulmonary embolism. Home meds reviewed, no contraindications noted - cbc within normal limits  Heparin level supratherapeutic on 1200 units/hr  Goal of Therapy:  Heparin level 0.3-0.7 units/ml Monitor platelets by anticoagulation protocol: Yes   Plan:  Decrease heparin gtt to 1050 units/hr Heparin level with AM labs F/u CT angiogram/LE [place] results and [facility] plan  Daylene Posey, PharmD, [facility] Clinical Pharmacist ED Pharmacist Phone # [phone] 04/01/2023 8:52 PM

## 2023-04-01 NOTE — ED Notes (Signed)
Dr. Estell Harpin at bedside assessing patient.

## 2023-04-01 NOTE — ED Triage Notes (Signed)
Pt BIB [facility] from home C/O SOB and L sided back pain that sometimes radiates to L ribcage. Reports pain has been going on for about a week, no known injury. Also reports some coughing for about 1 week.

## 2023-04-01 NOTE — ED Notes (Signed)
Pt desat to 82% RA, placed on 2L [place], improved to 93%. Denies increased SOB during desat. MD notified.

## 2023-04-01 NOTE — Progress Notes (Signed)
ANTICOAGULATION CONSULT NOTE - Initial Consult  Pharmacy Consult for Heparin Indication: pulmonary embolus  Allergies  Allergen Reactions   Quinapril Other (See Comments)    Reaction:  Elevated potassium    Venlafaxine Other (See Comments)    Reaction:  GI upset    Zetia [Ezetimibe] Nausea And Vomiting    Patient Measurements: Height: 5\' 3"  (160 cm) Weight: 99.8 kg (220 lb) IBW/kg (Calculated) : 52.4 Heparin Dosing Weight: 76 kg  Vital Signs: Temp: 98.3 F (36.8 C) (07/13 0812) Temp Source: Oral (07/13 0812) BP: 119/53 (07/13 1045) Pulse Rate: 66 (07/13 1045)  Labs: Recent Labs    04/01/23 0814 04/01/23 1015  HGB 12.1  --   HCT 38.7  --   PLT 239  --   CREATININE 1.93*  --   TROPONINIHS 10 9    Estimated Creatinine Clearance: 27.1 mL/min (A) (by C-G formula based on SCr of 1.93 mg/dL (H)).  Assessment: 77 yoM hx CAD, DM, HF, CKD admitted with SOB. Pharmacy consulted to initiate systemic heparin therapy for suspected pulmonary embolism. Home meds reviewed, no contraindications noted - cbc within normal limits  Goal of Therapy:  Heparin level 0.3-0.7 units/ml Monitor platelets by anticoagulation protocol: Yes   Plan:  Heparin bolus 4500 units Heparin drip at 1200 uinits/hr Heparin level in 8 hours Daily heparin level and CBC  Caryl Asp, PharmD Clinical Pharmacist 04/01/2023 11:19 AM

## 2023-04-01 NOTE — H&P (Signed)
History and Physical    Patient: Teresa Mercado ZOX:[id number] DOB: [birth year] DOA: 04/01/2023 DOS: the patient was seen and examined on 04/01/2023 PCP: Toma Deiters, MD  Patient coming from: Home  Chief Complaint:  Chief Complaint  Patient presents with   Shortness of Breath   HPI: Teresa Mercado is a 77 y.o. female with medical history significant of combined systolic and diastolic heart failure, stage III chronic kidney disease, poorly controlled diabetes, hypertension, COPD.  Patient seen for left-sided chest pain that has been going on over the past week.  Her pain is worse with coughing and moving.  It improved with rest.  She denies shortness of breath on my interview, however multiple nursing notes and EDP note mentions a complaint of shortness of breath.  She denies dyspnea on exertion, claudication, leg swelling.  Her oxygen saturation while in the emergency department dropped to 82% and she was started on 2 L nasal cannula with good result.  Blood work was obtained, showing a D-dimer of 5.52.  Due to elevated creatinine, a plain CT chest was obtained: No pneumonia or pulmonary process observed.  Heparin drip started.  A VQ scan was also obtained, which did not show an obvious PE.  Due to her presentation and a significantly elevated D-dimer, I was asked to evaluate the patient  Review of Systems: As mentioned in the history of present illness. All other systems reviewed and are negative. Past Medical History:  Diagnosis Date   Chronic combined systolic (congestive) and diastolic (congestive) heart failure (HCC)    a. 09/2015 Echo: normal EF 55%; b. 07/2017 Echo: EF 25%, Gr2 DD, mild lvh, mid ant/inf/infsept/apical AK. Triv MR. mildly to mod dil LA.   CKD (chronic kidney disease), stage III (HCC)    COPD (chronic obstructive pulmonary disease) (HCC)    Not on home o2   Diabetes mellitus without complication (HCC)    On Levemir   Non-ischemic cardiomyopathy (HCC)    a. 07/2017 Echo: EF  25%, Gr2 DD - ? takotsubo.   Non-obstructive CAD (coronary artery disease)    a. 07/2017 Cath: LM nl, LAD 40ost, D1 50, D2 40, LCX 35m, OM1/2 nl, RCA large, nl.   Syncope    a.  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   Past Surgical History:  Procedure Laterality Date   APPENDECTOMY     ESOPHAGOGASTRODUODENOSCOPY N/A 11/27/2015   Procedure: ESOPHAGOGASTRODUODENOSCOPY (EGD);  Surgeon: Christena Deem, MD;  Location: [facility] ENDOSCOPY;  Service: Endoscopy;  Laterality: N/A;   KYPHOPLASTY N/A 02/21/2019   Procedure: KYPHOPLASTY L4 - DIABETIC;  Surgeon: Kennedy Bucker, MD;  Location: [facility] ORS;  Service: Orthopedics;  Laterality: N/A;   LEFT HEART CATH AND CORONARY ANGIOGRAPHY N/A 07/21/2017   Procedure: LEFT HEART CATH AND CORONARY ANGIOGRAPHY;  Surgeon: Yvonne Kendall, MD;  Location: [facility] INVASIVE CV LAB;  Service: Cardiovascular;  Laterality: N/A;   PACEMAKER INSERTION     TONSILLECTOMY     TUBAL LIGATION     Social History:  reports that she quit smoking about 17 years ago. Her smoking use included cigarettes. She has never used smokeless tobacco. She reports that she does not drink alcohol and does not use drugs.  Allergies  Allergen Reactions   Quinapril Other (See Comments)    Reaction:  Elevated potassium    Venlafaxine Other (See Comments)    Reaction:  GI upset    Zetia [Ezetimibe] Nausea And Vomiting    Family History  Problem  Relation Age of Onset   Kidney cancer Father    Esophageal cancer Brother     Prior to Admission medications   Medication Sig Start Date End Date Taking? Authorizing Provider  albuterol (PROVENTIL HFA;VENTOLIN HFA) 108 (90 Base) MCG/ACT inhaler Inhale 1-2 puffs into the lungs every 6 (six) hours as needed for wheezing or shortness of breath.   Yes [provider]  aspirin 81 MG EC tablet Take 81 mg by mouth daily. Swallow whole.   Yes [provider]  atorvastatin (LIPITOR) 80 MG tablet Take 1 tablet (80 mg  total) by mouth daily. 04/12/18  Yes Gollan, Tollie Pizza, MD  furosemide (LASIX) 40 MG tablet Take 60 mg by mouth daily.    Yes [provider]  insulin aspart (NOVOLOG) 100 UNIT/ML FlexPen Inject 16 Units into the skin 2 (two) times daily.    Yes [provider]  insulin glargine (LANTUS SOLOSTAR) 100 UNIT/ML Solostar Pen Inject 10-20 Units into the skin 2 (two) times daily. Inject 20 units in the morning and 10 units at bedtime   Yes [provider]  insulin detemir (LEVEMIR) 100 UNIT/ML injection Inject 40 Units into the skin at bedtime.  Patient not taking: Reported on 04/01/2023    [provider]  isosorbide mononitrate (IMDUR) 30 MG 24 hr tablet Take 1 tablet (30 mg total) by mouth daily. Patient not taking: Reported on 04/01/2023 04/12/18   Antonieta Iba, MD  lisinopril (PRINIVIL,ZESTRIL) 10 MG tablet Take 10 mg by mouth daily. Patient not taking: Reported on 04/01/2023    [provider]  Misc Natural Products (LUTEIN 20) CAPS Take 1 capsule by mouth daily.    [provider]  vitamin B-12 (CYANOCOBALAMIN) 1000 MCG tablet Take 1,000 mcg by mouth daily.    [provider]    Physical Exam: Vitals:   04/01/23 1145 04/01/23 1215 04/01/23 1300 04/01/23 1345  BP: 124/66 (!) 118/55 119/73 (!) 141/76  Pulse: 70 67 68 74  Resp: 12 (!) 21 20 20   Temp:  98.2 F (36.8 C)    TempSrc:      SpO2: 96% 95% 95% 98%  Weight:      Height:       General: Elderly female. Awake and alert and oriented x3. No acute cardiopulmonary distress.  HEENT: Normocephalic atraumatic.  Right and left ears normal in appearance.  Pupils equal, round, reactive to light. Extraocular muscles are intact. Sclerae anicteric and noninjected.  Moist mucosal membranes. No mucosal lesions.  Neck: Neck supple without lymphadenopathy. No carotid bruits. No masses palpated.  Cardiovascular: Regular rate with normal S1-S2 sounds. No murmurs, rubs, gallops auscultated.  No JVD.  Respiratory: Good respiratory effort with no wheezes, rales, rhonchi. Lungs clear to auscultation bilaterally.  No accessory muscle use. Abdomen: Soft, nontender, nondistended. Active bowel sounds. No masses or hepatosplenomegaly  Skin: No rashes, lesions, or ulcerations.  Dry, warm to touch. 2+ dorsalis pedis and radial pulses. Musculoskeletal: No calf or leg pain. All major joints not erythematous nontender.  No upper or lower joint deformation.  Good ROM.  No contractures  Psychiatric: Intact judgment and insight. Pleasant and cooperative. Neurologic: No focal neurological deficits. Strength is 5/5 and symmetric in upper and lower extremities.  Cranial nerves II through XII are grossly intact.  Data Reviewed: Results for orders placed or performed during the hospital encounter of 04/01/23 (from the past 24 hour(s))  CBC with Differential     Status: None   Collection Time: 04/01/23  8:14 AM  Result Value Ref Range   WBC 8.3 4.0 - 10.5 K/uL   RBC 4.09 3.87 - 5.11 MIL/uL   Hemoglobin 12.1 12.0 - 15.0 g/dL   HCT 16.1 09.6 - 04.5 %   MCV 94.6 80.0 - 100.0 fL   MCH 29.6 26.0 - 34.0 pg   MCHC 31.3 30.0 - 36.0 g/dL   RDW 40.9 81.1 - 91.4 %   Platelets 239 150 - 400 K/uL   nRBC 0.0 0.0 - 0.2 %   Neutrophils Relative % 69 %   Neutro Abs 5.7 1.7 - 7.7 K/uL   Lymphocytes Relative 18 %   Lymphs Abs 1.5 0.7 - 4.0 K/uL   Monocytes Relative 8 %   Monocytes Absolute 0.7 0.1 - 1.0 K/uL   Eosinophils Relative 4 %   Eosinophils Absolute 0.3 0.0 - 0.5 K/uL   Basophils Relative 0 %   Basophils Absolute 0.0 0.0 - 0.1 K/uL   Immature Granulocytes 1 %   Abs Immature Granulocytes 0.05 0.00 - 0.07 K/uL  Comprehensive metabolic panel     Status: Abnormal   Collection Time: 04/01/23  8:14 AM  Result Value Ref Range   Sodium 139 135 - 145 mmol/L   Potassium 4.4 3.5 - 5.1 mmol/L   Chloride 104 98 - 111 mmol/L   CO2 24 22 - 32 mmol/L   Glucose, Bld 150 (H) 70 - 99 mg/dL   BUN 70 (H) 8 - 23  mg/dL   Creatinine, Ser 7.82 (H) 0.44 - 1.00 mg/dL   Calcium 9.1 8.9 - 95.6 mg/dL   Total Protein 7.4 6.5 - 8.1 g/dL   Albumin 3.4 (L) 3.5 - 5.0 g/dL   AST 22 15 - 41 U/L   ALT 22 0 - 44 U/L   Alkaline Phosphatase 68 38 - 126 U/L   Total Bilirubin 0.6 0.3 - 1.2 mg/dL   GFR, Estimated 26 (L) >60 mL/min   Anion gap 11 5 - 15  Troponin I (High Sensitivity)     Status: None   Collection Time: 04/01/23  8:14 AM  Result Value Ref Range   Troponin I (High Sensitivity) 10 <18 ng/L  Brain natriuretic peptide     Status: Abnormal   Collection Time: 04/01/23  8:14 AM  Result Value Ref Range   B Natriuretic Peptide 137.0 (H) 0.0 - 100.0 pg/mL  D-dimer, quantitative     Status: Abnormal   Collection Time: 04/01/23  9:50 AM  Result Value Ref Range   D-Dimer, Quant 5.52 (H) 0.00 - 0.50 ug/mL-FEU  Troponin I (High Sensitivity)     Status: None   Collection Time: 04/01/23 10:15 AM  Result Value Ref Range   Troponin I (High Sensitivity) 9 <18 ng/L    NM Pulmonary Perfusion  Result Date: 04/01/2023 CLINICAL DATA:  Shortness of breath left-sided chest pain EXAM: NUCLEAR MEDICINE PERFUSION LUNG SCAN TECHNIQUE: Perfusion images were obtained in multiple projections after intravenous injection of radiopharmaceutical. Ventilation scans intentionally deferred if perfusion scan and chest x-ray adequate for interpretation during COVID 19 epidemic. RADIOPHARMACEUTICALS:  4.0 mCi Tc-44m MAA IV COMPARISON:  Chest x-ray 04/01/2023 FINDINGS: Nonsegmental round defect in the left mid chest inconsistently visualized. No significant wedge-shaped perfusion defects are visualized. IMPRESSION: No significant wedge-shaped perfusion defects. Pulmonary embolism absent per Pisaped criteria Electronically Signed   By: Jasmine Pang M.D.   On: 04/01/2023 15:35   CT Chest Wo Contrast  Result Date: 04/01/2023 CLINICAL DATA:  Chronic shortness of breath. Left-sided  back pain radiating to the ribs for the past week. No injury.  EXAM: CT CHEST WITHOUT CONTRAST TECHNIQUE: Multidetector CT imaging of the chest was performed following the standard protocol without IV contrast. RADIATION DOSE REDUCTION: This exam was performed according to the departmental dose-optimization program which includes automated exposure control, adjustment of the mA and/or kV according to patient size and/or use of iterative reconstruction technique. COMPARISON:  Chest x-ray from same day. FINDINGS: Cardiovascular: Mild cardiomegaly. No pericardial effusion. Dense calcification of the aortic and mitral valves. No thoracic aortic aneurysm. Coronary, aortic arch, and branch vessel atherosclerotic vascular disease. Left chest wall pacemaker. Mediastinum/Nodes: No enlarged mediastinal or axillary lymph nodes. Thyroid gland, trachea, and esophagus demonstrate no significant findings. Lungs/Pleura: Mild centrilobular and paraseptal emphysema. Mild bibasilar atelectasis and linear scarring. No focal consolidation, pleural effusion, or pneumothorax. No suspicious pulmonary nodule. Upper Abdomen: No acute abnormality. Chronic left renal atrophy and dystrophic calcification in the lower pole. Musculoskeletal: No acute or significant osseous findings. IMPRESSION: 1. No acute intrathoracic process. 2. Aortic Atherosclerosis (ICD10-I70.0) and Emphysema (ICD10-J43.9). Electronically Signed   By: Obie Dredge M.D.   On: 04/01/2023 10:50   DG Chest Port 1 View  Result Date: 04/01/2023 CLINICAL DATA:  Shortness of breath. Left-sided chest and back pain for 1 week. EXAM: PORTABLE CHEST 1 VIEW COMPARISON:  09/20/2018 FINDINGS: Stable cardiomegaly. Permanent pacemaker remains in appropriate position. Mild bibasilar scarring remains stable. No evidence of acute infiltrate or edema. No evidence of pleural effusion. IMPRESSION: Stable mild cardiomegaly and bibasilar scarring.  No active disease. Electronically Signed   By: Danae Orleans M.D.   On: 04/01/2023 09:30     Assessment  and Plan: No notes have been filed under this hospital service. Service: Hospitalist  Principal Problem:   SOB (shortness of breath) Active Problems:   Chronic diastolic CHF (congestive heart failure) (HCC)   Poorly controlled type 2 diabetes mellitus (HCC)   Chronic renal failure in pediatric patient, stage 3 (moderate) (HCC)   Chest pain   Essential hypertension   AKI (acute kidney injury) (HCC)  Chest pain/shortness of breath With a D-dimer of 5.5, that is concerning for possible PE.  The false-negative rate ranges from less than 1% to 4% with subsegmental PE having the higher false negative rate. Admit Will keep that patient on heparin. Hydrate to improve renal function WIll obtain CTA in AM if renal function is improved. LE duplex. AKI on stage III chronic kidney disease Rehydrate Cr in AM Poorly controlled type 2 diabetes Home insulin regimen SSI Hypertension Heart failure with reduced EF Hold lasix   Advance Care Planning:   Code Status: Full Code confirmed by patient  Consults: none  Family Communication:   Severity of Illness: The appropriate patient status for this patient is INPATIENT. Inpatient status is judged to be reasonable and necessary in order to provide the required intensity of service to ensure the patient's safety. The patient's presenting symptoms, physical exam findings, and initial radiographic and laboratory data in the context of their chronic comorbidities is felt to place them at high risk for further clinical deterioration. Furthermore, it is not anticipated that the patient will be medically stable for discharge from the hospital within 2 midnights of admission.   * I certify that at the point of admission it is my clinical judgment that the patient will require inpatient hospital care spanning beyond 2 midnights from the point of admission due to high intensity of service, high risk for further deterioration and  high frequency of surveillance  required.*  Author: Levie Heritage, DO 04/01/2023 4:50 PM  For on call review [email or web address].

## 2023-04-01 NOTE — ED Notes (Addendum)
Per Tori on 300, ready for patient.

## 2023-04-01 NOTE — ED Provider Notes (Signed)
[place] EMERGENCY DEPARTMENT AT [facility] Provider Note   CSN: [id number] Arrival date & time: 04/01/23  0805     History  Chief Complaint  Patient presents with   Shortness of Breath    Teresa Mercado is a 77 y.o. female.  Patient complains of 5 days of shortness of breath and pain on inspiration on the left side.  Patient has a history of COPD and heart failure and diabetes  The history is provided by the patient and medical records. No language interpreter was used.  Shortness of Breath Severity:  Moderate Onset quality:  Sudden Timing:  Constant Progression:  Worsening Chronicity:  New Context: activity   Relieved by:  Nothing Worsened by:  Nothing Ineffective treatments:  None tried Associated symptoms: no abdominal pain, no chest pain, no cough, no headaches and no rash        Home Medications Prior to Admission medications   Medication Sig Start Date End Date Taking? Authorizing Provider  albuterol (PROVENTIL HFA;VENTOLIN HFA) 108 (90 Base) MCG/ACT inhaler Inhale 1-2 puffs into the lungs every 6 (six) hours as needed for wheezing or shortness of breath.    [provider]  aspirin 81 MG EC tablet Take 81 mg by mouth daily. Swallow whole.    [provider]  atorvastatin (LIPITOR) 80 MG tablet Take 1 tablet (80 mg total) by mouth daily. 04/12/18   Antonieta Iba, MD  carvedilol (COREG) 3.125 MG tablet Take 1 tablet (3.125 mg total) by mouth 2 (two) times daily with a meal. 04/12/18   Gollan, Tollie Pizza, MD  celecoxib (CELEBREX) 100 MG capsule Take 100 mg by mouth daily. 04/21/20   [provider]  esomeprazole (NEXIUM) 40 MG capsule Take 1 capsule (40 mg total) by mouth daily. 05/25/17   Antonieta Iba, MD  furosemide (LASIX) 40 MG tablet Take 60 mg by mouth daily.     [provider]  hydrALAZINE (APRESOLINE) 25 MG tablet Take 1 tablet (25 mg total) by mouth 3 (three) times daily. 04/12/18   Antonieta Iba, MD   insulin aspart (NOVOLOG) 100 UNIT/ML FlexPen Inject 16 Units into the skin 2 (two) times daily.     [provider]  insulin detemir (LEVEMIR) 100 UNIT/ML injection Inject 40 Units into the skin at bedtime.     [provider]  isosorbide mononitrate (IMDUR) 30 MG 24 hr tablet Take 1 tablet (30 mg total) by mouth daily. 04/12/18   Antonieta Iba, MD  lisinopril (PRINIVIL,ZESTRIL) 10 MG tablet Take 10 mg by mouth daily.    [provider]  Misc Natural Products (LUTEIN 20) CAPS Take 1 capsule by mouth daily.    [provider]  vitamin B-12 (CYANOCOBALAMIN) 1000 MCG tablet Take 1,000 mcg by mouth daily.    [provider]      Allergies    Quinapril, Venlafaxine, and Zetia [ezetimibe]    Review of Systems   Review of Systems  Constitutional:  Negative for appetite change and fatigue.  HENT:  Negative for congestion, ear discharge and sinus pressure.   Eyes:  Negative for discharge.  Respiratory:  Positive for shortness of breath. Negative for cough.   Cardiovascular:  Negative for chest pain.  Gastrointestinal:  Negative for abdominal pain and diarrhea.  Genitourinary:  Negative for frequency and hematuria.  Musculoskeletal:  Negative for back pain.  Skin:  Negative for rash.  Neurological:  Negative for seizures and headaches.  Psychiatric/Behavioral:  Negative for  hallucinations.     Physical Exam Updated Vital Signs BP (!) 141/76   Pulse 74   Temp 98.2 F (36.8 C)   Resp 20   Ht 5\' 3"  (1.6 m)   Wt 99.8 kg   SpO2 98%   BMI 38.97 kg/m  Physical Exam Vitals and nursing note reviewed.  Constitutional:      Appearance: She is well-developed.  HENT:     Head: Normocephalic.     Nose: Nose normal.  Eyes:     General: No scleral icterus.    Conjunctiva/sclera: Conjunctivae normal.  Neck:     Thyroid: No thyromegaly.  Cardiovascular:     Rate and Rhythm: Normal rate and regular rhythm.     Heart sounds: No murmur heard.     No friction rub. No gallop.  Pulmonary:     Breath sounds: No stridor. No wheezing or rales.  Chest:     Chest wall: No tenderness.  Abdominal:     General: There is no distension.     Tenderness: There is no abdominal tenderness. There is no rebound.  Musculoskeletal:        General: Normal range of motion.     Cervical back: Neck supple.  Lymphadenopathy:     Cervical: No cervical adenopathy.  Skin:    Findings: No erythema or rash.  Neurological:     Mental Status: She is alert and oriented to person, place, and time.     Motor: No abnormal muscle tone.     Coordination: Coordination normal.  Psychiatric:        Behavior: Behavior normal.     ED Results / Procedures / Treatments   Labs (all labs ordered are listed, but only abnormal results are displayed) Labs Reviewed  COMPREHENSIVE METABOLIC PANEL - Abnormal; Notable for the following components:      Result Value   Glucose, Bld 150 (*)    BUN 70 (*)    Creatinine, Ser 1.93 (*)    Albumin 3.4 (*)    GFR, Estimated 26 (*)    All other components within normal limits  D-DIMER, QUANTITATIVE - Abnormal; Notable for the following components:   D-Dimer, Quant 5.52 (*)    All other components within normal limits  BRAIN NATRIURETIC PEPTIDE - Abnormal; Notable for the following components:   B Natriuretic Peptide 137.0 (*)    All other components within normal limits  CBC WITH DIFFERENTIAL/PLATELET  HEPARIN LEVEL (UNFRACTIONATED)  TROPONIN I (HIGH SENSITIVITY)  TROPONIN I (HIGH SENSITIVITY)    EKG EKG Interpretation Date/Time:  Saturday April 01 2023 08:12:14 EDT Ventricular Rate:  85 PR Interval:  185 QRS Duration:  149 QT Interval:  456 QTC Calculation: 543 R Axis:   -80  Text Interpretation: Sinus rhythm Nonspecific IVCD with LAD Left ventricular hypertrophy Inferior infarct, old Anterior infarct, old Confirmed by Bethann Berkshire [phone]) on 04/01/2023 8:28:31 AM  Radiology CT Chest Wo Contrast  Result Date:  04/01/2023 CLINICAL DATA:  Chronic shortness of breath. Left-sided back pain radiating to the ribs for the past week. No injury. EXAM: CT CHEST WITHOUT CONTRAST TECHNIQUE: Multidetector CT imaging of the chest was performed following the standard protocol without IV contrast. RADIATION DOSE REDUCTION: This exam was performed according to the departmental dose-optimization program which includes automated exposure control, adjustment of the mA and/or kV according to patient size and/or use of iterative reconstruction technique. COMPARISON:  Chest x-ray from same day. FINDINGS: Cardiovascular: Mild cardiomegaly. No pericardial effusion. Dense calcification of  the aortic and mitral valves. No thoracic aortic aneurysm. Coronary, aortic arch, and branch vessel atherosclerotic vascular disease. Left chest wall pacemaker. Mediastinum/Nodes: No enlarged mediastinal or axillary lymph nodes. Thyroid gland, trachea, and esophagus demonstrate no significant findings. Lungs/Pleura: Mild centrilobular and paraseptal emphysema. Mild bibasilar atelectasis and linear scarring. No focal consolidation, pleural effusion, or pneumothorax. No suspicious pulmonary nodule. Upper Abdomen: No acute abnormality. Chronic left renal atrophy and dystrophic calcification in the lower pole. Musculoskeletal: No acute or significant osseous findings. IMPRESSION: 1. No acute intrathoracic process. 2. Aortic Atherosclerosis (ICD10-I70.0) and Emphysema (ICD10-J43.9). Electronically Signed   By: Obie Dredge M.D.   On: 04/01/2023 10:50   DG Chest Port 1 View  Result Date: 04/01/2023 CLINICAL DATA:  Shortness of breath. Left-sided chest and back pain for 1 week. EXAM: PORTABLE CHEST 1 VIEW COMPARISON:  09/20/2018 FINDINGS: Stable cardiomegaly. Permanent pacemaker remains in appropriate position. Mild bibasilar scarring remains stable. No evidence of acute infiltrate or edema. No evidence of pleural effusion. IMPRESSION: Stable mild cardiomegaly  and bibasilar scarring.  No active disease. Electronically Signed   By: Danae Orleans M.D.   On: 04/01/2023 09:30    Procedures Procedures    Medications Ordered in ED Medications  heparin ADULT infusion 100 units/mL (25000 units/211mL) (1,200 Units/hr Intravenous New Bag/Given 04/01/23 1121)  ondansetron (ZOFRAN) injection 4 mg (has no administration in time range)  HYDROmorphone (DILAUDID) injection 0.5 mg (0.5 mg Intravenous Given 04/01/23 0832)  ondansetron (ZOFRAN) injection 4 mg (4 mg Intravenous Given 04/01/23 0832)  heparin bolus via infusion 4,500 Units (4,500 Units Intravenous Bolus from Bag 04/01/23 1121)  technetium albumin aggregated (MAA) injection solution 4 millicurie (4 millicuries Intravenous Contrast Given 04/01/23 1410)    ED Course/ Medical Decision Making/ A&P  CRITICAL CARE Performed by: Bethann Berkshire Total critical care time: 40 minutes Critical care time was exclusive of separately billable procedures and treating other patients. Critical care was necessary to treat or prevent imminent or life-threatening deterioration. Critical care was time spent personally by me on the following activities: development of treatment plan with patient and/or surrogate as well as nursing, discussions with consultants, evaluation of patient's response to treatment, examination of patient, obtaining history from patient or surrogate, ordering and performing treatments and interventions, ordering and review of laboratory studies, ordering and review of radiographic studies, pulse oximetry and re-evaluation of patient's condition. Patient with negative CT chest without contrast and VQ scan was negative.  Patient has an elevated D-dimer.  She initially was put on heparin for possible PE.                           Medical Decision Making Amount and/or Complexity of Data Reviewed Labs: ordered. Radiology: ordered.  Risk Prescription drug management. Decision regarding  hospitalization.  This patient presents to the ED for concern of shortness of breath, this involves an extensive number of treatment options, and is a complaint that carries with it a high risk of complications and morbidity.  The differential diagnosis includes PE, COPD, pneumonia   Co morbidities that complicate the patient evaluation  COPD   Additional history obtained:  Additional history obtained from patient External records from outside source obtained and reviewed including hospital records   Lab Tests:  I Ordered, and personally interpreted labs.  The pertinent results include: BUN 70 creatinine 1.9   Imaging Studies ordered:  I ordered imaging studies including chest x-ray I independently visualized and interpreted imaging which showed  cardiomegaly I agree with the radiologist interpretation   Cardiac Monitoring: / EKG:  The patient was maintained on a cardiac monitor.  I personally viewed and interpreted the cardiac monitored which showed an underlying rhythm of: Normal sinus rhythm   Consultations Obtained:  I requested consultation with the hospitalist,  and discussed lab and imaging findings as well as pertinent plan - they recommend: Admit   Problem List / ED Course / Critical interventions / Medication management  COPD and chest pain I ordered medication including heparin for possible PE Reevaluation of the patient after these medicines showed that the patient improved I have reviewed the patients home medicines and have made adjustments as needed   Social Determinants of Health:  None   Test / Admission - Considered:  CT chest but patient's GI for stool  Patient with hypoxia and chest pain.  She will be admitted to medicine Possible COPD or PE that not seen on VQ scan       Final Clinical Impression(s) / ED Diagnoses Final diagnoses:  None    Rx / DC Orders ED Discharge Orders     None         Bethann Berkshire, MD 04/03/23  1647

## 2023-04-02 ENCOUNTER — Inpatient Hospital Stay (HOSPITAL_COMMUNITY): Payer: Medicare Other

## 2023-04-02 DIAGNOSIS — N1832 Chronic kidney disease, stage 3b: Secondary | ICD-10-CM

## 2023-04-02 DIAGNOSIS — I5032 Chronic diastolic (congestive) heart failure: Secondary | ICD-10-CM

## 2023-04-02 DIAGNOSIS — R0602 Shortness of breath: Secondary | ICD-10-CM | POA: Diagnosis not present

## 2023-04-02 DIAGNOSIS — N183 Chronic kidney disease, stage 3 unspecified: Secondary | ICD-10-CM

## 2023-04-02 DIAGNOSIS — I5022 Chronic systolic (congestive) heart failure: Secondary | ICD-10-CM | POA: Insufficient documentation

## 2023-04-02 LAB — URINALYSIS, W/ REFLEX TO CULTURE (INFECTION SUSPECTED)
Bilirubin Urine: NEGATIVE
Glucose, UA: NEGATIVE mg/dL
Hgb urine dipstick: NEGATIVE
Ketones, ur: NEGATIVE mg/dL
Nitrite: POSITIVE — AB
Protein, ur: NEGATIVE mg/dL
Specific Gravity, Urine: 1.012 (ref 1.005–1.030)
pH: 5 (ref 5.0–8.0)

## 2023-04-02 LAB — CBC
HCT: 33.6 % — ABNORMAL LOW (ref 36.0–46.0)
Hemoglobin: 10.4 g/dL — ABNORMAL LOW (ref 12.0–15.0)
MCH: 29.6 pg (ref 26.0–34.0)
MCHC: 31 g/dL (ref 30.0–36.0)
MCV: 95.7 fL (ref 80.0–100.0)
Platelets: 208 10*3/uL (ref 150–400)
RBC: 3.51 MIL/uL — ABNORMAL LOW (ref 3.87–5.11)
RDW: 15.1 % (ref 11.5–15.5)
WBC: 8.6 10*3/uL (ref 4.0–10.5)
nRBC: 0 % (ref 0.0–0.2)

## 2023-04-02 LAB — GLUCOSE, CAPILLARY
Glucose-Capillary: 140 mg/dL — ABNORMAL HIGH (ref 70–99)
Glucose-Capillary: 122 mg/dL — ABNORMAL HIGH (ref 70–99)
Glucose-Capillary: 104 mg/dL — ABNORMAL HIGH (ref 70–99)
Glucose-Capillary: 119 mg/dL — ABNORMAL HIGH (ref 70–99)

## 2023-04-02 LAB — BASIC METABOLIC PANEL
Anion gap: 8 (ref 5–15)
BUN: 57 mg/dL — ABNORMAL HIGH (ref 8–23)
CO2: 24 mmol/L (ref 22–32)
Calcium: 8.3 mg/dL — ABNORMAL LOW (ref 8.9–10.3)
Chloride: 107 mmol/L (ref 98–111)
Creatinine, Ser: 1.91 mg/dL — ABNORMAL HIGH (ref 0.44–1.00)
GFR, Estimated: 27 mL/min — ABNORMAL LOW (ref >60)
Glucose, Bld: 129 mg/dL — ABNORMAL HIGH (ref 70–99)
Potassium: 4.6 mmol/L (ref 3.5–5.1)
Sodium: 139 mmol/L (ref 135–145)

## 2023-04-02 LAB — HEPARIN LEVEL (UNFRACTIONATED): Heparin Unfractionated: 0.95 IU/mL — ABNORMAL HIGH (ref 0.30–0.70)

## 2023-04-02 MED ORDER — GUAIFENESIN-DM 100-10 MG/5ML PO SYRP
5.0000 mL | ORAL_SOLUTION | ORAL | Status: DC | PRN
  Administered 2023-04-02 – 2023-04-03 (×3): 5 mL via ORAL
  Filled 2023-04-02 (×3): qty 5

## 2023-04-02 MED ORDER — TRAMADOL HCL 50 MG PO TABS
50.0000 mg | ORAL_TABLET | Freq: Four times a day (QID) | ORAL | Status: DC | PRN
  Administered 2023-04-03 (×2): 50 mg via ORAL
  Filled 2023-04-02 (×2): qty 1

## 2023-04-02 MED ORDER — PANTOPRAZOLE SODIUM 40 MG PO TBEC
40.0000 mg | DELAYED_RELEASE_TABLET | Freq: Every day | ORAL | Status: DC
  Administered 2023-04-02 – 2023-04-04 (×3): 40 mg via ORAL
  Filled 2023-04-02 (×3): qty 1

## 2023-04-02 MED ORDER — ACETAMINOPHEN 500 MG PO TABS
1000.0000 mg | ORAL_TABLET | Freq: Four times a day (QID) | ORAL | Status: DC
  Administered 2023-04-02 – 2023-04-04 (×8): 1000 mg via ORAL
  Filled 2023-04-02 (×8): qty 2

## 2023-04-02 MED ORDER — LORATADINE 10 MG PO TABS
10.0000 mg | ORAL_TABLET | Freq: Every day | ORAL | Status: DC
  Administered 2023-04-02 – 2023-04-04 (×3): 10 mg via ORAL
  Filled 2023-04-02 (×3): qty 1

## 2023-04-02 NOTE — Progress Notes (Addendum)
PROGRESS NOTE  Teresa Mercado WJX:[id number] DOB: 11/07/1945 DOA: 04/01/2023 PCP: Toma Deiters, MD  Brief History:  77 year old female with a history of systolic and diastolic CHF, CKD stage III, diabetes mellitus type 2, hypertension, coronary artery disease, complete heart block status post permanent pacemaker, hyperlipidemia, COPD, tobacco use in remission, chronic respiratory failure on 2 L at nighttime presenting with left-sided chest pain and left flank pain.  The patient states that she has had left-sided lower rib and left flank pain for the better part of a month.  It has worsened in the past week.  She describes it is mostly pleuritic which is worse with inspiration, movement, and coughing.  She has been complaining of a largely nonproductive cough worsening at night.  She denies any hemoptysis.  She states that her breathing is actually about the same as usual.  She denies any worsening shortness of breath.  She has been able to perform her activities of daily living without any increased work of breathing or worsening shortness of breath.  She does not use oxygen at home.  She denies any recent fevers, chills, headache, neck pain.  There is no recent travels.  Denies any recent sick contacts.  Because of her worsening cough and pleuritic pain, she presented for further evaluation and treatment. Notably, the patient quit smoking about 18 years ago after an 80-pack-year history. Notably, the patient had a mechanical fall at the end of June landing on her buttock and left side.  She states that after this fall she has noted increasing cough and pain on her left ribs as well. In the ED, the patient was afebrile and hemodynamically stable with oxygen saturation 93% room air.  She was placed on 2 L with saturation up to 100%.  WBC 8.3, hemoglobin 12.1, platelets 239,000.  Sodium 139, potassium 4.4, bicarb 24, serum creatinine 1.93.  LFTs were unremarkable.  CT of the chest without contrast  showed bibasilar atelectasis.  There was no edema or infiltrates.  There was paraseptal and centrilobular emphysema.  VQ scan was negative for PE.  Because of elevated D-dimer, the patient was started on IV heparin.  Assessment/Plan: Acute on chronic respiratory failure with hypoxia -Suspect this is secondary to the patient's underlying COPD and now hypoventilation secondary to her pleuritic chest pain. -Clinical suspicion for PE is very low despite elevated D-dimer -Discontinue heparin drip -V/Q scan negative for PE -Patient is chronically on 2 L at nighttime -Currently stable on 2 L -CT chest as discussed above -viral respiratory panel  Left flank pain -Obtain UA -CT renal stone protocol  Pleuritic chest pain -Suspect this is primarily musculoskeletal etiology -No visible rash noted on exam -Judicious opioids --Echocardiogram -Troponins negative 10>>9  Chronic HFimpEF -07/21/2017 echo EF 25% -09/21/2018 echo EF 55-60%, +WMA -Clinically euvolemic -Continue home dose furosemide -Update echocardiogram  Elevated D-dimer -VQ scan negative -Ultrasound lower extremities negative for DVT -Discontinue heparin drip -Very low clinical suspicion for PE at this point  CKD stage IIIb -Baseline creatinine 1.5-1.8 -Monitor BMP  Mixed hyperlipidemia -Continue statin  Diabetes mellitus type 2 with hyperglycemia -Update hemoglobin A1c -NovoLog sliding scale -Reduced dose Levemir  Coronary artery disease -Presentation not consistent with ACS -Continue aspirin -Continue statin  Class 2 obesity -BMI 35.66 -lifestyle modification    Family Communication:   no Family at bedside  Consultants:  none  Code Status:  FULL  DVT Prophylaxis: IV heparin>> Scurry Heparin   Procedures:  As Listed in Progress Note Above  Antibiotics: None       Subjective:  Patient complains of left flank pain and left-sided posterior rib pain.  She denies any shortness of breath, nausea,  vomiting or direct abdominal pain.  Denies any fevers or chills.  She has nonproductive cough but there is no hemoptysis. Objective: Vitals:   04/01/23 1712 04/01/23 2025 04/01/23 2351 04/02/23 0442  BP: 120/81 106/61 101/79 (!) 107/57  Pulse: 81 78 65 73  Resp: 19 20 20 20   Temp: 97.8 F (36.6 C) 97.6 F (36.4 C) 98.2 F (36.8 C) 98.2 F (36.8 C)  TempSrc: Oral Oral Oral   SpO2: 100% 100% 99% 98%  Weight: 91.3 kg     Height: 5\' 3"  (1.6 m)      No intake or output data in the 24 hours ending 04/02/23 1121 Weight change:  Exam:  General:  Pt is alert, follows commands appropriately, not in acute distress HEENT: No icterus, No thrush, No neck mass, [place]/AT Cardiovascular: RRR, S1/S2, no rubs, no gallops Respiratory: Diminished breath sounds.  Bibasilar rales but no wheezing. Abdomen: Soft/+BS, non tender, non distended, no guarding; left leg pain.  No visible rashes.  Tender to palpation. Extremities: No edema, No lymphangitis, No petechiae, No rashes, no synovitis   Data Reviewed: I have personally reviewed following labs and imaging studies Basic Metabolic Panel: Recent Labs  Lab 04/01/23 0814 04/02/23 0438  NA 139 139  K 4.4 4.6  CL 104 107  CO2 24 24  GLUCOSE 150* 129*  BUN 70* 57*  CREATININE 1.93* 1.91*  CALCIUM 9.1 8.3*   Liver Function Tests: Recent Labs  Lab 04/01/23 0814  AST 22  ALT 22  ALKPHOS 68  BILITOT 0.6  PROT 7.4  ALBUMIN 3.4*   No results for input(s): "LIPASE", "AMYLASE" in the last 168 hours. No results for input(s): "AMMONIA" in the last 168 hours. Coagulation Profile: No results for input(s): "INR", "PROTIME" in the last 168 hours. CBC: Recent Labs  Lab 04/01/23 0814 04/02/23 0438  WBC 8.3 8.6  NEUTROABS 5.7  --   HGB 12.1 10.4*  HCT 38.7 33.6*  MCV 94.6 95.7  PLT 239 208   Cardiac Enzymes: No results for input(s): "CKTOTAL", "CKMB", "CKMBINDEX", "TROPONINI" in the last 168 hours. BNP: Invalid input(s):  "POCBNP" CBG: Recent Labs  Lab 04/01/23 1742 04/01/23 2102 04/02/23 0744  GLUCAP 220* 175* 140*   HbA1C: No results for input(s): "HGBA1C" in the last 72 hours. Urine analysis: No results found for: "COLORURINE", "APPEARANCEUR", "LABSPEC", "PHURINE", "GLUCOSEU", "HGBUR", "BILIRUBINUR", "KETONESUR", "PROTEINUR", "UROBILINOGEN", "NITRITE", "LEUKOCYTESUR" Sepsis Labs: @LABRCNTIP (procalcitonin:4,lacticidven:4) )No results found for this or any previous visit (from the past 240 hour(s)).   Scheduled Meds:  aspirin EC  81 mg Oral Daily   atorvastatin  80 mg Oral Daily   insulin aspart  0-5 Units Subcutaneous QHS   insulin aspart  0-9 Units Subcutaneous TID WC   insulin aspart  16 Units Subcutaneous BID WC   insulin glargine-yfgn  10 Units Subcutaneous QHS   insulin glargine-yfgn  20 Units Subcutaneous q morning   Continuous Infusions:  sodium chloride 100 mL/hr at 04/01/23 1830   heparin 850 Units/hr (04/02/23 0954)    Procedures/Studies: US Venous Img Lower Bilateral (DVT)  Result Date: 04/02/2023 CLINICAL DATA:  Chest pain and elevated D-dimer. EXAM: BILATERAL LOWER EXTREMITY VENOUS DOPPLER ULTRASOUND TECHNIQUE: Gray-scale sonography with graded compression, as well as color Doppler and duplex ultrasound were performed to evaluate the lower  extremity deep venous systems from the level of the common femoral vein and including the common femoral, femoral, profunda femoral, popliteal and calf veins including the posterior tibial, peroneal and gastrocnemius veins when visible. The superficial great saphenous vein was also interrogated. Spectral Doppler was utilized to evaluate flow at rest and with distal augmentation maneuvers in the common femoral, femoral and popliteal veins. COMPARISON:  None Available. FINDINGS: RIGHT LOWER EXTREMITY Common Femoral Vein: No evidence of thrombus. Normal compressibility, respiratory phasicity and response to augmentation. Saphenofemoral Junction: No  evidence of thrombus. Normal compressibility and flow on color Doppler imaging. Profunda Femoral Vein: No evidence of thrombus. Normal compressibility and flow on color Doppler imaging. Femoral Vein: No evidence of thrombus. Normal compressibility, respiratory phasicity and response to augmentation. Popliteal Vein: No evidence of thrombus. Normal compressibility, respiratory phasicity and response to augmentation. Calf Veins: No evidence of thrombus. Normal compressibility and flow on color Doppler imaging. Superficial Great Saphenous Vein: No evidence of thrombus. Normal compressibility. Venous Reflux:  None. Other Findings: No evidence of superficial thrombophlebitis or abnormal fluid collection. LEFT LOWER EXTREMITY Common Femoral Vein: No evidence of thrombus. Normal compressibility, respiratory phasicity and response to augmentation. Saphenofemoral Junction: No evidence of thrombus. Normal compressibility and flow on color Doppler imaging. Profunda Femoral Vein: No evidence of thrombus. Normal compressibility and flow on color Doppler imaging. Femoral Vein: No evidence of thrombus. Normal compressibility, respiratory phasicity and response to augmentation. Popliteal Vein: No evidence of thrombus. Normal compressibility, respiratory phasicity and response to augmentation. Calf Veins: No evidence of thrombus. Normal compressibility and flow on color Doppler imaging. Superficial Great Saphenous Vein: No evidence of thrombus. Normal compressibility. Venous Reflux:  None. Other Findings: No evidence of superficial thrombophlebitis or abnormal fluid collection. IMPRESSION: No evidence of deep venous thrombosis in either lower extremity. Electronically Signed   By: Irish Lack M.D.   On: 04/02/2023 10:33   NM Pulmonary Perfusion  Result Date: 04/01/2023 CLINICAL DATA:  Shortness of breath left-sided chest pain EXAM: NUCLEAR MEDICINE PERFUSION LUNG SCAN TECHNIQUE: Perfusion images were obtained in multiple  projections after intravenous injection of radiopharmaceutical. Ventilation scans intentionally deferred if perfusion scan and chest x-ray adequate for interpretation during COVID 19 epidemic. RADIOPHARMACEUTICALS:  4.0 mCi Tc-29m MAA IV COMPARISON:  Chest x-ray 04/01/2023 FINDINGS: Nonsegmental round defect in the left mid chest inconsistently visualized. No significant wedge-shaped perfusion defects are visualized. IMPRESSION: No significant wedge-shaped perfusion defects. Pulmonary embolism absent per Pisaped criteria Electronically Signed   By: Jasmine Pang M.D.   On: 04/01/2023 15:35   CT Chest Wo Contrast  Result Date: 04/01/2023 CLINICAL DATA:  Chronic shortness of breath. Left-sided back pain radiating to the ribs for the past week. No injury. EXAM: CT CHEST WITHOUT CONTRAST TECHNIQUE: Multidetector CT imaging of the chest was performed following the standard protocol without IV contrast. RADIATION DOSE REDUCTION: This exam was performed according to the departmental dose-optimization program which includes automated exposure control, adjustment of the mA and/or kV according to patient size and/or use of iterative reconstruction technique. COMPARISON:  Chest x-ray from same day. FINDINGS: Cardiovascular: Mild cardiomegaly. No pericardial effusion. Dense calcification of the aortic and mitral valves. No thoracic aortic aneurysm. Coronary, aortic arch, and branch vessel atherosclerotic vascular disease. Left chest wall pacemaker. Mediastinum/Nodes: No enlarged mediastinal or axillary lymph nodes. Thyroid gland, trachea, and esophagus demonstrate no significant findings. Lungs/Pleura: Mild centrilobular and paraseptal emphysema. Mild bibasilar atelectasis and linear scarring. No focal consolidation, pleural effusion, or pneumothorax. No suspicious pulmonary nodule. Upper Abdomen:  No acute abnormality. Chronic left renal atrophy and dystrophic calcification in the lower pole. Musculoskeletal: No acute or  significant osseous findings. IMPRESSION: 1. No acute intrathoracic process. 2. Aortic Atherosclerosis (ICD10-I70.0) and Emphysema (ICD10-J43.9). Electronically Signed   By: Obie Dredge M.D.   On: 04/01/2023 10:50   DG Chest Port 1 View  Result Date: 04/01/2023 CLINICAL DATA:  Shortness of breath. Left-sided chest and back pain for 1 week. EXAM: PORTABLE CHEST 1 VIEW COMPARISON:  09/20/2018 FINDINGS: Stable cardiomegaly. Permanent pacemaker remains in appropriate position. Mild bibasilar scarring remains stable. No evidence of acute infiltrate or edema. No evidence of pleural effusion. IMPRESSION: Stable mild cardiomegaly and bibasilar scarring.  No active disease. Electronically Signed   By: Danae Orleans M.D.   On: 04/01/2023 09:30    Catarina Hartshorn, DO  [facility]  If 7PM-7AM, please contact night-coverage [email or web address] Password [email or web address] 04/02/2023, 11:21 AM   LOS: 1 day

## 2023-04-02 NOTE — Progress Notes (Signed)
ANTICOAGULATION CONSULT NOTE  Pharmacy Consult for Heparin Indication: pulmonary embolus  Allergies  Allergen Reactions   Quinapril Other (See Comments)    Reaction:  Elevated potassium    Venlafaxine Other (See Comments)    Reaction:  GI upset    Zetia [Ezetimibe] Nausea And Vomiting    Patient Measurements: Height: 5\' 3"  (160 cm) Weight: 91.3 kg (201 lb 4.5 oz) IBW/kg (Calculated) : 52.4 Heparin Dosing Weight: 76 kg  Vital Signs: Temp: 98.2 F (36.8 C) (07/14 0442) Temp Source: Oral (07/13 2351) BP: 107/57 (07/14 0442) Pulse Rate: 73 (07/14 0442)  Labs: Recent Labs    04/01/23 0814 04/01/23 1015 04/01/23 1959 04/02/23 0438  HGB 12.1  --   --  10.4*  HCT 38.7  --   --  33.6*  PLT 239  --   --  208  HEPARINUNFRC  --   --  0.96* 0.95*  CREATININE 1.93*  --   --  1.91*  TROPONINIHS 10 9  --   --     Estimated Creatinine Clearance: 26.1 mL/min (A) (by C-G formula based on SCr of 1.91 mg/dL (H)).  Assessment: 77 yoM hx CAD, DM, HF, CKD admitted with SOB. Pharmacy consulted to initiate systemic heparin therapy for suspected pulmonary embolism. Home meds reviewed, no contraindications noted - cbc within normal limits  Heparin remains supratherapeutic this am - slight bleeding around IV site/bend in arm. Noted Hgb drift  Goal of Therapy:  Heparin level 0.3-0.7 units/ml Monitor platelets by anticoagulation protocol: Yes   Plan:  Decrease heparin gtt to 850 units/hr Heparin level in 8 hours Awaiting results from [place]  Caryl Asp, PharmD Clinical Pharmacist 04/02/2023 9:46 AM

## 2023-04-02 NOTE — Progress Notes (Signed)
   04/02/23 1336  Vitals  BP (!) 97/42  MAP (mmHg) (!) 60  BP Method Automatic  Pulse Rate 72  Pulse Rate Source Monitor  MEWS COLOR  MEWS Score Color Green  Oxygen Therapy  SpO2 100 %  MEWS Score  MEWS Temp 0  MEWS Systolic 1  MEWS Pulse 0  MEWS RR 0  MEWS LOC 0  MEWS Score 1   MD Tat notified.

## 2023-04-02 NOTE — Progress Notes (Signed)
IV bleeding some at site where heparin is going in. Pharmacist Elvira aware.

## 2023-04-03 ENCOUNTER — Inpatient Hospital Stay (HOSPITAL_COMMUNITY): Payer: Medicare Other

## 2023-04-03 ENCOUNTER — Other Ambulatory Visit (HOSPITAL_COMMUNITY): Payer: Self-pay | Admitting: *Deleted

## 2023-04-03 DIAGNOSIS — R079 Chest pain, unspecified: Secondary | ICD-10-CM | POA: Diagnosis not present

## 2023-04-03 DIAGNOSIS — R0602 Shortness of breath: Secondary | ICD-10-CM | POA: Diagnosis not present

## 2023-04-03 DIAGNOSIS — N1832 Chronic kidney disease, stage 3b: Secondary | ICD-10-CM | POA: Diagnosis not present

## 2023-04-03 DIAGNOSIS — I5022 Chronic systolic (congestive) heart failure: Secondary | ICD-10-CM | POA: Diagnosis not present

## 2023-04-03 DIAGNOSIS — I5032 Chronic diastolic (congestive) heart failure: Secondary | ICD-10-CM | POA: Diagnosis not present

## 2023-04-03 LAB — BASIC METABOLIC PANEL
Anion gap: 6 (ref 5–15)
BUN: 50 mg/dL — ABNORMAL HIGH (ref 8–23)
CO2: 22 mmol/L (ref 22–32)
Calcium: 7.9 mg/dL — ABNORMAL LOW (ref 8.9–10.3)
Chloride: 112 mmol/L — ABNORMAL HIGH (ref 98–111)
Creatinine, Ser: 1.78 mg/dL — ABNORMAL HIGH (ref 0.44–1.00)
GFR, Estimated: 29 mL/min — ABNORMAL LOW (ref >60)
Glucose, Bld: 139 mg/dL — ABNORMAL HIGH (ref 70–99)
Potassium: 4.7 mmol/L (ref 3.5–5.1)
Sodium: 140 mmol/L (ref 135–145)

## 2023-04-03 LAB — CBC
HCT: 32.2 % — ABNORMAL LOW (ref 36.0–46.0)
Hemoglobin: 9.7 g/dL — ABNORMAL LOW (ref 12.0–15.0)
MCH: 29.7 pg (ref 26.0–34.0)
MCHC: 30.1 g/dL (ref 30.0–36.0)
MCV: 98.5 fL (ref 80.0–100.0)
Platelets: 165 10*3/uL (ref 150–400)
RBC: 3.27 MIL/uL — ABNORMAL LOW (ref 3.87–5.11)
RDW: 14.9 % (ref 11.5–15.5)
WBC: 7 10*3/uL (ref 4.0–10.5)
nRBC: 0 % (ref 0.0–0.2)

## 2023-04-03 LAB — ECHOCARDIOGRAM COMPLETE
Area-P 1/2: 3.91 cm2
Height: 63 in
S' Lateral: 3.9 cm
Weight: 3220.48 oz

## 2023-04-03 LAB — RESPIRATORY PANEL BY PCR

## 2023-04-03 LAB — MAGNESIUM: Magnesium: 1.8 mg/dL (ref 1.7–2.4)

## 2023-04-03 LAB — HEMOGLOBIN A1C
Hgb A1c MFr Bld: 6.8 % — ABNORMAL HIGH (ref 4.8–5.6)
Mean Plasma Glucose: 148 mg/dL

## 2023-04-03 LAB — GLUCOSE, CAPILLARY
Glucose-Capillary: 129 mg/dL — ABNORMAL HIGH (ref 70–99)
Glucose-Capillary: 199 mg/dL — ABNORMAL HIGH (ref 70–99)
Glucose-Capillary: 108 mg/dL — ABNORMAL HIGH (ref 70–99)
Glucose-Capillary: 129 mg/dL — ABNORMAL HIGH (ref 70–99)

## 2023-04-03 LAB — LACTIC ACID, PLASMA: Lactic Acid, Venous: 0.9 mmol/L (ref 0.5–1.9)

## 2023-04-03 LAB — PROCALCITONIN: Procalcitonin: <0.1 ng/mL

## 2023-04-03 MED ORDER — METOPROLOL TARTRATE 25 MG PO TABS
12.5000 mg | ORAL_TABLET | Freq: Two times a day (BID) | ORAL | Status: DC
  Filled 2023-04-03: qty 1

## 2023-04-03 NOTE — Progress Notes (Signed)
*  PRELIMINARY RESULTS* Echocardiogram 2D Echocardiogram has been performed.  Stacey Drain 04/03/2023, 3:45 PM

## 2023-04-03 NOTE — Plan of Care (Signed)
  Problem: Education: Goal: Knowledge of General Education information will improve Description: Including pain rating scale, medication(s)/side effects and non-pharmacologic comfort measures Outcome: Progressing   Problem: Health Behavior/Discharge Planning: Goal: Ability to manage health-related needs will improve Outcome: Progressing   Problem: Clinical Measurements: Goal: Ability to maintain clinical measurements within normal limits will improve Outcome: Progressing Goal: Will remain free from infection Outcome: Progressing Goal: Diagnostic test results will improve Outcome: Progressing Goal: Respiratory complications will improve Outcome: Progressing Goal: Cardiovascular complication will be avoided Outcome: Progressing   Problem: Activity: Goal: Risk for activity intolerance will decrease Outcome: Progressing   Problem: Nutrition: Goal: Adequate nutrition will be maintained Outcome: Progressing   Problem: Coping: Goal: Level of anxiety will decrease Outcome: Progressing   Problem: Elimination: Goal: Will not experience complications related to urinary retention Outcome: Progressing   Problem: Safety: Goal: Ability to remain free from injury will improve Outcome: Progressing   Problem: Elimination: Goal: Will not experience complications related to bowel motility Outcome: Not Progressing   Problem: Pain Managment: Goal: General experience of comfort will improve Outcome: Not Progressing

## 2023-04-03 NOTE — Plan of Care (Signed)
  Problem: Acute Rehab PT Goals(only PT should resolve) Goal: Pt Will Go Supine/Side To Sit Outcome: Progressing Flowsheets (Taken 04/03/2023 1354) Pt will go Supine/Side to Sit: with supervision Goal: Patient Will Transfer Sit To/From Stand Outcome: Progressing Flowsheets (Taken 04/03/2023 1354) Patient will transfer sit to/from stand: with supervision Goal: Pt Will Transfer Bed To Chair/Chair To Bed Outcome: Progressing Flowsheets (Taken 04/03/2023 1354) Pt will Transfer Bed to Chair/Chair to Bed: with supervision Goal: Pt Will Ambulate Outcome: Progressing Flowsheets (Taken 04/03/2023 1354) Pt will Ambulate:  25 feet  with min guard assist  with minimal assist  with rolling walker   1:55 PM, 04/03/23 Ocie Bob, MPT Physical Therapist with [facility] [phone] office [phone] mobile phone

## 2023-04-03 NOTE — Evaluation (Addendum)
Physical Therapy Evaluation Patient Details Name: Teresa Mercado MRN: [id number] DOB: 1945-10-30 Today's Date: 04/03/2023  History of Present Illness  Teresa Mercado is a 77 y.o. female with medical history significant of combined systolic and diastolic heart failure, stage III chronic kidney disease, poorly controlled diabetes, hypertension, COPD.  Patient seen for left-sided chest pain that has been going on over the past week.  Her pain is worse with coughing and moving.  It improved with rest.  She denies shortness of breath on my interview, however multiple nursing notes and EDP note mentions a complaint of shortness of breath.  She denies dyspnea on exertion, claudication, leg swelling.  Her oxygen saturation while in the emergency department dropped to 82% and she was started on 2 L nasal cannula with good result.  Blood work was obtained, showing a D-dimer of 5.52.  Due to elevated creatinine, a plain CT chest was obtained: No pneumonia or pulmonary process observed.  Heparin drip started.  A VQ scan was also obtained, which did not show an obvious PE.  Due to her presentation and a significantly elevated D-dimer, I was asked to evaluate the patient  Clinical Impression   Orthostatic vital signs performed during session today. Supine: O2 97%, 67 bpm, BP 112/46 -- Seated: O2 92%, 78 bpm, BP 120/52 -- Standing: O2 93%, 72 bpm, BP 112/74. All positioned performed on room air. Pt's activity limted due to fatigue. Patient tolerated sitting up in chair after therapy.  PLAN:  Patient will benefit from continued skilled physical therapy in hospital and recommended venue below to increase strength, balance, endurance for safe ADLs and gait.       Assistance Recommended at Discharge Set up Supervision/Assistance  If plan is discharge home, recommend the following:  Can travel by private vehicle  A lot of help with walking and/or transfers;A little help with bathing/dressing/bathroom        Equipment  Recommendations Rolling walker (2 wheels)  Recommendations for Other Services       Functional Status Assessment Patient has had a recent decline in their functional status and demonstrates the ability to make significant improvements in function in a reasonable and predictable amount of time.     Precautions / Restrictions Precautions Precautions: Fall Restrictions Weight Bearing Restrictions: No      Mobility  Bed Mobility Overal bed mobility: Needs Assistance Bed Mobility: Supine to Sit     Supine to sit: Min assist, Mod assist     General bed mobility comments: Limited due to fatigue    Transfers Overall transfer level: Needs assistance Equipment used: Rolling walker (2 wheels) Transfers: Sit to/from Stand Sit to Stand: Min assist, Mod assist           General transfer comment: Tactile cues required to complete transfer    Ambulation/Gait Ambulation/Gait assistance: Mod assist   Assistive device: Rolling walker (2 wheels) Gait Pattern/deviations: Decreased step length - right, Decreased step length - left, Decreased stride length Gait velocity: slow     General Gait Details: Pt ambulated 5 steps with RW near bedside, labored and slow  Stairs            Wheelchair Mobility     Tilt Bed    Modified Rankin (Stroke Patients Only)       Balance Overall balance assessment: Needs assistance Sitting-balance support: Bilateral upper extremity supported Sitting balance-Leahy Scale: Fair Sitting balance - Comments: Fair seated balance at EOB   Standing balance support: Bilateral upper extremity supported Standing  balance-Leahy Scale: Fair Standing balance comment: Fair standing balance with use of RW                             Pertinent Vitals/Pain Pain Assessment Pain Assessment: No/denies pain    Home Living Family/patient expects to be discharged to:: Private residence Living Arrangements: Spouse/significant other Available  Help at Discharge: Family Type of Home: House Home Access: Stairs to enter;Ramped entrance Entrance Stairs-Rails: None Entrance Stairs-Number of Steps: 1   Home Layout: One level Home Equipment: Firefighter      Prior Function Prior Level of Function : Independent/Modified Independent                     Higher education careers adviser        Extremity/Trunk Assessment                Communication   Communication: No difficulties  Cognition Arousal/Alertness: Awake/alert Behavior During Therapy: WFL for tasks assessed/performed Overall Cognitive Status: Within Functional Limits for tasks assessed                                          General Comments      Exercises     Assessment/Plan    PT Assessment Patient needs continued PT services  PT Problem List Decreased strength;Decreased activity tolerance;Decreased balance       PT Treatment Interventions DME instruction;Gait training;Stair training;Functional mobility training;Therapeutic activities;Therapeutic exercise;Patient/family education;Balance training    PT Goals (Current goals can be found in the Care Plan section)  Acute Rehab PT Goals Patient Stated Goal: Return home with assist of family PT Goal Formulation: With patient Time For Goal Achievement: 04/17/23    Frequency Min 3X/week     Co-evaluation               AM-PAC PT "6 Clicks" Mobility  Outcome Measure Help needed turning from your back to your side while in a flat bed without using bedrails?: A Little Help needed moving from lying on your back to sitting on the side of a flat bed without using bedrails?: A Lot Help needed moving to and from a bed to a chair (including a wheelchair)?: A Lot Help needed standing up from a chair using your arms (e.g., wheelchair or bedside chair)?: A Lot Help needed to walk in hospital room?: A Lot Help needed climbing 3-5 steps with a railing? : Total 6 Click Score: 12    End  of Session   Activity Tolerance: Patient tolerated treatment well;Patient limited by fatigue Patient left: in chair;with call bell/phone within reach Nurse Communication: Mobility status PT Visit Diagnosis: Unsteadiness on feet (R26.81);Muscle weakness (generalized) (M62.81);Difficulty in walking, not elsewhere classified (R26.2)    Time: 1007-1030 PT Time Calculation (min) (ACUTE ONLY): 23 min   Charges:   PT Evaluation $PT Eval Moderate Complexity: 1 Mod PT Treatments $Therapeutic Activity: 23-37 mins PT General Charges $$ ACUTE PT VISIT: 1 Visit        Colleen Pecoraro SPT [place], DPT Program     This licensed practitioner was present in the room guiding the student in service delivery. Therapy student was participating in the provision of services, and the practitioner was not engaged in treating another patient or doing other tasks at the same time.   2:32 PM, 04/03/23 Fayrene Fearing  Corine Shelter, MPT Physical Therapist with [facility] [phone] office [phone] mobile phone

## 2023-04-03 NOTE — TOC Progression Note (Signed)
Transition of Care [facility]) - Progression Note    Patient Details  Name: Teresa Mercado MRN: [id number] Date of Birth: 08/10/1946  [facility] CM/SW Contact  Karn Cassis, [place] Phone Number: 04/03/2023, 1:15 PM  Clinical Narrative: PT recommending home health and rolling walker. Pt states she has walker at home. She is on 2L home O2 ([employer]). Discussed HHPT and pt declines. She is aware to contact TOC if she changes her mind.         Barriers to Discharge: Continued Medical Work up  Expected Discharge Plan and Services                                               Social Determinants of Health (SDOH) Interventions SDOH Screenings   Food Insecurity: No Food Insecurity (04/01/2023)  Housing: Low Risk  (04/01/2023)  Transportation Needs: No Transportation Needs (04/01/2023)  Utilities: Not At Risk (04/01/2023)  Tobacco Use: Medium Risk (04/01/2023)    Readmission Risk Interventions     No data to display

## 2023-04-03 NOTE — Progress Notes (Signed)
PROGRESS NOTE  Ellicia Alix UEA:[id number] DOB: Feb 13, 1946 DOA: 04/01/2023 PCP: Toma Deiters, MD  Brief History:  77 year old female with a history of systolic and diastolic CHF, CKD stage III, diabetes mellitus type 2, hypertension, coronary artery disease, complete heart block status post permanent pacemaker, hyperlipidemia, COPD, tobacco use in remission, chronic respiratory failure on 2 L at nighttime presenting with left-sided chest pain and left flank pain.  The patient states that she has had left-sided lower rib and left flank pain for the better part of a month.  It has worsened in the past week.  She describes it is mostly pleuritic which is worse with inspiration, movement, and coughing.  She has been complaining of a largely nonproductive cough worsening at night.  She denies any hemoptysis.  She states that her breathing is actually about the same as usual.  She denies any worsening shortness of breath.  She has been able to perform her activities of daily living without any increased work of breathing or worsening shortness of breath.  She does not use oxygen at home.  She denies any recent fevers, chills, headache, neck pain.  There is no recent travels.  Denies any recent sick contacts.  Because of her worsening cough and pleuritic pain, she presented for further evaluation and treatment. Notably, the patient quit smoking about 18 years ago after an 80-pack-year history. Notably, the patient had a mechanical fall at the end of June landing on her buttock and left side.  She states that after this fall she has noted increasing cough and pain on her left ribs as well. In the ED, the patient was afebrile and hemodynamically stable with oxygen saturation 93% room air.  She was placed on 2 L with saturation up to 100%.  WBC 8.3, hemoglobin 12.1, platelets 239,000.  Sodium 139, potassium 4.4, bicarb 24, serum creatinine 1.93.  LFTs were unremarkable.  CT of the chest without contrast  showed bibasilar atelectasis.  There was no edema or infiltrates.  There was paraseptal and centrilobular emphysema.  VQ scan was negative for PE.  Because of elevated D-dimer, the patient was started on IV heparin.  Assessment/Plan: Acute on chronic respiratory failure with hypoxia -Suspect this is secondary to the patient's underlying COPD and now hypoventilation secondary to her pleuritic chest pain. -Clinical suspicion for PE is very low despite elevated D-dimer -Discontinue heparin drip -V/Q scan negative for PE -Patient is chronically on 2 L at nighttime -Currently stable on 2 L>>weaned to RA on 7/15 -CT chest as discussed above -viral respiratory panel--neg   Left flank pain -Obtain UA 21-50 WBC -CT renal stone protocol--large left staghorn calculus, AF levels in nondilated SB loops -largely musculoskeletal -prn tramadol  Left Staghorn renal calculus -urology consult appreciated -empiric ceftriaxone pending culture -UA 21-50 WBC -discussed with Dr. Ronne Binning   Pleuritic chest pain -Suspect this is primarily musculoskeletal etiology -No visible rash noted on exam -Judicious opioids--prn tramadol -7/15 Echo -Troponins negative 10>>9   Chronic HFimpEF -07/21/2017 echo EF 25% -09/21/2018 echo EF 55-60%, +WMA -Clinically euvolemic -holding home dose furosemide temporarily -04/03/23 echo--   Elevated D-dimer -VQ scan negative -Ultrasound lower extremities negative for DVT -Discontinue heparin drip -Very low clinical suspicion for PE at this point   CKD stage IIIb -Baseline creatinine 1.5-1.8 -Monitor BMP   Mixed hyperlipidemia -Continue statin   Diabetes mellitus type 2 with hyperglycemia -04/01/23 hemoglobin A1c--6.8 -NovoLog sliding scale -Reduced dose Levemir   Coronary  artery disease -Presentation not consistent with ACS -Continue aspirin -Continue statin   Class 2 obesity -BMI 35.66 -lifestyle modification       Family Communication:   Family at  bedside 7/15   Consultants:  urology   Code Status:  FULL   DVT Prophylaxis: IV heparin>> Braman Heparin     Procedures: As Listed in Progress Note Above   Antibiotics: Ceftriaxone 7/15>>         Subjective: Pt states left flank pain is a little better but still hurts with minimal movement.  Denies f/,c cp, sob, n/v/d, abd pain  Objective: Vitals:   04/02/23 1336 04/02/23 2036 04/03/23 0600 04/03/23 1000  BP: (!) 97/42 (!) 109/50 (!) 93/40 (!) 104/40  Pulse: 72 72 68   Resp:  20 16 16   Temp:  98.9 F (37.2 C) 97.9 F (36.6 C)   TempSrc:   Oral   SpO2: 100% 98% 99%   Weight:      Height:        Intake/Output Summary (Last 24 hours) at 04/03/2023 1603 Last data filed at 04/03/2023 1351 Gross per 24 hour  Intake 480 ml  Output --  Net 480 ml   Weight change:  Exam:  General:  Pt is alert, follows commands appropriately, not in acute distress HEENT: No icterus, No thrush, No neck mass, [place]/AT Cardiovascular: RRR, S1/S2, no rubs, no gallops Respiratory: bibasilar crackles. No wheeze Abdomen: Soft/+BS, non tender, non distended, no guarding Extremities: trace LE nonpitting edema, No lymphangitis, No petechiae, No rashes, no synovitis   Data Reviewed: I have personally reviewed following labs and imaging studies Basic Metabolic Panel: Recent Labs  Lab 04/01/23 0814 04/02/23 0438 04/03/23 0516  NA 139 139 140  K 4.4 4.6 4.7  CL 104 107 112*  CO2 24 24 22   GLUCOSE 150* 129* 139*  BUN 70* 57* 50*  CREATININE 1.93* 1.91* 1.78*  CALCIUM 9.1 8.3* 7.9*  MG  --   --  1.8   Liver Function Tests: Recent Labs  Lab 04/01/23 0814  AST 22  ALT 22  ALKPHOS 68  BILITOT 0.6  PROT 7.4  ALBUMIN 3.4*   No results for input(s): "LIPASE", "AMYLASE" in the last 168 hours. No results for input(s): "AMMONIA" in the last 168 hours. Coagulation Profile: No results for input(s): "INR", "PROTIME" in the last 168 hours. CBC: Recent Labs  Lab 04/01/23 0814  04/02/23 0438 04/03/23 0516  WBC 8.3 8.6 7.0  NEUTROABS 5.7  --   --   HGB 12.1 10.4* 9.7*  HCT 38.7 33.6* 32.2*  MCV 94.6 95.7 98.5  PLT 239 208 165   Cardiac Enzymes: No results for input(s): "CKTOTAL", "CKMB", "CKMBINDEX", "TROPONINI" in the last 168 hours. BNP: Invalid input(s): "POCBNP" CBG: Recent Labs  Lab 04/02/23 1126 04/02/23 1556 04/02/23 2036 04/03/23 0721 04/03/23 1127  GLUCAP 122* 104* 119* 129* 199*   HbA1C: Recent Labs    04/01/23 1959  HGBA1C 6.8*   Urine analysis:    Component Value Date/Time   COLORURINE YELLOW 04/02/2023 1203   APPEARANCEUR HAZY (A) 04/02/2023 1203   LABSPEC 1.012 04/02/2023 1203   PHURINE 5.0 04/02/2023 1203   GLUCOSEU NEGATIVE 04/02/2023 1203   HGBUR NEGATIVE 04/02/2023 1203   BILIRUBINUR NEGATIVE 04/02/2023 1203   KETONESUR NEGATIVE 04/02/2023 1203   PROTEINUR NEGATIVE 04/02/2023 1203   NITRITE POSITIVE (A) 04/02/2023 1203   LEUKOCYTESUR MODERATE (A) 04/02/2023 1203   Sepsis Labs: @LABRCNTIP (procalcitonin:4,lacticidven:4) ) Recent Results (from the past 240 hour(s))  Respiratory (~20 pathogens) panel by PCR     Status: None   Collection Time: 04/02/23 12:03 PM   Specimen: Nasopharyngeal Swab; Respiratory  Result Value Ref Range Status   Adenovirus NOT DETECTED NOT DETECTED Final   Coronavirus 229E NOT DETECTED NOT DETECTED Final    Comment: (NOTE) The Coronavirus on the Respiratory Panel, DOES NOT test for the novel  Coronavirus (2019 nCoV)    Coronavirus HKU1 NOT DETECTED NOT DETECTED Final   Coronavirus NL63 NOT DETECTED NOT DETECTED Final   Coronavirus OC43 NOT DETECTED NOT DETECTED Final   Metapneumovirus NOT DETECTED NOT DETECTED Final   Rhinovirus / Enterovirus NOT DETECTED NOT DETECTED Final   Influenza A NOT DETECTED NOT DETECTED Final   Influenza B NOT DETECTED NOT DETECTED Final   Parainfluenza Virus 1 NOT DETECTED NOT DETECTED Final   Parainfluenza Virus 2 NOT DETECTED NOT DETECTED Final    Parainfluenza Virus 3 NOT DETECTED NOT DETECTED Final   Parainfluenza Virus 4 NOT DETECTED NOT DETECTED Final   Respiratory Syncytial Virus NOT DETECTED NOT DETECTED Final   Bordetella pertussis NOT DETECTED NOT DETECTED Final   Bordetella Parapertussis NOT DETECTED NOT DETECTED Final   Chlamydophila pneumoniae NOT DETECTED NOT DETECTED Final   Mycoplasma pneumoniae NOT DETECTED NOT DETECTED Final    Comment: Performed at [facility], [address] [place], [place] [address]  Culture, blood (Routine X 2) w Reflex to ID Panel     Status: None (Preliminary result)   Collection Time: 04/03/23  7:06 AM   Specimen: BLOOD  Result Value Ref Range Status   Specimen Description BLOOD BLOOD LEFT ARM  Final   Special Requests   Final    BOTTLES DRAWN AEROBIC AND ANAEROBIC Blood Culture adequate volume   Culture   Final    NO GROWTH < 12 HOURS Performed at [facility], [address] [place] [address] [place], [place] [address]    Report Status PENDING  Incomplete     Scheduled Meds:  acetaminophen  1,000 mg Oral Q6H   aspirin EC  81 mg Oral Daily   atorvastatin  80 mg Oral Daily   insulin aspart  0-5 Units Subcutaneous QHS   insulin aspart  0-9 Units Subcutaneous TID WC   insulin glargine-yfgn  10 Units Subcutaneous QHS   insulin glargine-yfgn  20 Units Subcutaneous q morning   loratadine  10 mg Oral Daily   pantoprazole  40 mg Oral Daily   Continuous Infusions:  sodium chloride 100 mL/hr at 04/03/23 0300    Procedures/Studies: CT RENAL STONE STUDY  Result Date: 04/02/2023 CLINICAL DATA:  Abdominal and flank pain on the left EXAM: CT ABDOMEN AND PELVIS WITHOUT CONTRAST TECHNIQUE: Multidetector CT imaging of the abdomen and pelvis was performed following the standard protocol without IV contrast. RADIATION DOSE REDUCTION: This exam was performed according to the departmental dose-optimization program which includes automated exposure control, adjustment of the mA and/or kV according to patient  size and/or use of iterative reconstruction technique. COMPARISON:  11/25/2015 FINDINGS: Lower chest: Left anterior descending and right coronary artery atherosclerosis. Facial leads noted in the right atrium and ventricle. Mitral valve calcification. Descending thoracic aortic atherosclerotic vascular calcification. Mild cardiomegaly. Small type 1 hiatal hernia. Mild scarring in both lower lobes along with peripheral atelectasis in both lower lobes. Mild lingular scarring. Hepatobiliary: No significant findings. Pancreas: Unremarkable Spleen: Unremarkable Adrenals/Urinary Tract: Both adrenal glands appear normal. Cortical thinning of both kidneys compatible with atrophy. 1.8 by 1.9 cm lesion, likely a  staghorn calculus, along the left kidney lower pole. No hydronephrosis or hydroureter. Stomach/Bowel: Descending and sigmoid colon diverticulosis. There some air-fluid levels in nondilated loops of proximal small bowel, possibly incidental but low-grade enteritis or mild proximal ileus could have a similar appearance. Borderline prominence of stool in the colon. Vascular/Lymphatic: Atherosclerosis is present, including aortoiliac atherosclerotic disease. No pathologic adenopathy. Substantial atheromatous plaque at the origin of the SMA. Reproductive: Left fundal calcified fibroid. Other: No supplemental non-categorized findings. Musculoskeletal: Chronic compression fracture at L4 with interval vertebral augmentation. Interval (since lumbar spine CT 01/17/2019) 2superior endplate compression fracture L3 with 40% loss of vertebral height. Schmorl's node along the superior endplate of L2. IMPRESSION: 1. Air-fluid levels in nondilated loops of proximal small bowel, possibly incidental but low-grade enteritis or mild proximal ileus could have a similar appearance. 2. Descending and sigmoid colon diverticulosis. 3. Interval (since lumbar spine CT 01/17/2019) but likely chronic superior endplate compression fracture at L3  with 40% loss of vertebral height. Old L4 compression fracture with vertebral augmentation. 4. Aortic and coronary atherosclerosis. Mitral valve calcification and mild cardiomegaly. 5. Small type 1 hiatal hernia. 6. Bilateral renal atrophy. Large staghorn calculus of the left kidney lower pole with adjacent scarring. 7. Left fundal calcified fibroid. Aortic Atherosclerosis (ICD10-I70.0). Electronically Signed   By: Gaylyn Rong M.D.   On: 04/02/2023 17:28   US Venous Img Lower Bilateral (DVT)  Result Date: 04/02/2023 CLINICAL DATA:  Chest pain and elevated D-dimer. EXAM: BILATERAL LOWER EXTREMITY VENOUS DOPPLER ULTRASOUND TECHNIQUE: Gray-scale sonography with graded compression, as well as color Doppler and duplex ultrasound were performed to evaluate the lower extremity deep venous systems from the level of the common femoral vein and including the common femoral, femoral, profunda femoral, popliteal and calf veins including the posterior tibial, peroneal and gastrocnemius veins when visible. The superficial great saphenous vein was also interrogated. Spectral Doppler was utilized to evaluate flow at rest and with distal augmentation maneuvers in the common femoral, femoral and popliteal veins. COMPARISON:  None Available. FINDINGS: RIGHT LOWER EXTREMITY Common Femoral Vein: No evidence of thrombus. Normal compressibility, respiratory phasicity and response to augmentation. Saphenofemoral Junction: No evidence of thrombus. Normal compressibility and flow on color Doppler imaging. Profunda Femoral Vein: No evidence of thrombus. Normal compressibility and flow on color Doppler imaging. Femoral Vein: No evidence of thrombus. Normal compressibility, respiratory phasicity and response to augmentation. Popliteal Vein: No evidence of thrombus. Normal compressibility, respiratory phasicity and response to augmentation. Calf Veins: No evidence of thrombus. Normal compressibility and flow on color Doppler imaging.  Superficial Great Saphenous Vein: No evidence of thrombus. Normal compressibility. Venous Reflux:  None. Other Findings: No evidence of superficial thrombophlebitis or abnormal fluid collection. LEFT LOWER EXTREMITY Common Femoral Vein: No evidence of thrombus. Normal compressibility, respiratory phasicity and response to augmentation. Saphenofemoral Junction: No evidence of thrombus. Normal compressibility and flow on color Doppler imaging. Profunda Femoral Vein: No evidence of thrombus. Normal compressibility and flow on color Doppler imaging. Femoral Vein: No evidence of thrombus. Normal compressibility, respiratory phasicity and response to augmentation. Popliteal Vein: No evidence of thrombus. Normal compressibility, respiratory phasicity and response to augmentation. Calf Veins: No evidence of thrombus. Normal compressibility and flow on color Doppler imaging. Superficial Great Saphenous Vein: No evidence of thrombus. Normal compressibility. Venous Reflux:  None. Other Findings: No evidence of superficial thrombophlebitis or abnormal fluid collection. IMPRESSION: No evidence of deep venous thrombosis in either lower extremity. Electronically Signed   By: Irish Lack M.D.   On: 04/02/2023  10:33   NM Pulmonary Perfusion  Result Date: 04/01/2023 CLINICAL DATA:  Shortness of breath left-sided chest pain EXAM: NUCLEAR MEDICINE PERFUSION LUNG SCAN TECHNIQUE: Perfusion images were obtained in multiple projections after intravenous injection of radiopharmaceutical. Ventilation scans intentionally deferred if perfusion scan and chest x-ray adequate for interpretation during COVID 19 epidemic. RADIOPHARMACEUTICALS:  4.0 mCi Tc-27m MAA IV COMPARISON:  Chest x-ray 04/01/2023 FINDINGS: Nonsegmental round defect in the left mid chest inconsistently visualized. No significant wedge-shaped perfusion defects are visualized. IMPRESSION: No significant wedge-shaped perfusion defects. Pulmonary embolism absent per Pisaped  criteria Electronically Signed   By: Jasmine Pang M.D.   On: 04/01/2023 15:35   CT Chest Wo Contrast  Result Date: 04/01/2023 CLINICAL DATA:  Chronic shortness of breath. Left-sided back pain radiating to the ribs for the past week. No injury. EXAM: CT CHEST WITHOUT CONTRAST TECHNIQUE: Multidetector CT imaging of the chest was performed following the standard protocol without IV contrast. RADIATION DOSE REDUCTION: This exam was performed according to the departmental dose-optimization program which includes automated exposure control, adjustment of the mA and/or kV according to patient size and/or use of iterative reconstruction technique. COMPARISON:  Chest x-ray from same day. FINDINGS: Cardiovascular: Mild cardiomegaly. No pericardial effusion. Dense calcification of the aortic and mitral valves. No thoracic aortic aneurysm. Coronary, aortic arch, and branch vessel atherosclerotic vascular disease. Left chest wall pacemaker. Mediastinum/Nodes: No enlarged mediastinal or axillary lymph nodes. Thyroid gland, trachea, and esophagus demonstrate no significant findings. Lungs/Pleura: Mild centrilobular and paraseptal emphysema. Mild bibasilar atelectasis and linear scarring. No focal consolidation, pleural effusion, or pneumothorax. No suspicious pulmonary nodule. Upper Abdomen: No acute abnormality. Chronic left renal atrophy and dystrophic calcification in the lower pole. Musculoskeletal: No acute or significant osseous findings. IMPRESSION: 1. No acute intrathoracic process. 2. Aortic Atherosclerosis (ICD10-I70.0) and Emphysema (ICD10-J43.9). Electronically Signed   By: Obie Dredge M.D.   On: 04/01/2023 10:50   DG Chest Port 1 View  Result Date: 04/01/2023 CLINICAL DATA:  Shortness of breath. Left-sided chest and back pain for 1 week. EXAM: PORTABLE CHEST 1 VIEW COMPARISON:  09/20/2018 FINDINGS: Stable cardiomegaly. Permanent pacemaker remains in appropriate position. Mild bibasilar scarring remains  stable. No evidence of acute infiltrate or edema. No evidence of pleural effusion. IMPRESSION: Stable mild cardiomegaly and bibasilar scarring.  No active disease. Electronically Signed   By: Danae Orleans M.D.   On: 04/01/2023 09:30    Catarina Hartshorn, DO  [facility]  If 7PM-7AM, please contact night-coverage [email or web address] Password [email or web address] 04/03/2023, 4:03 PM   LOS: 2 days

## 2023-04-04 ENCOUNTER — Inpatient Hospital Stay (HOSPITAL_COMMUNITY): Payer: Medicare Other

## 2023-04-04 ENCOUNTER — Encounter (HOSPITAL_COMMUNITY): Payer: Self-pay | Admitting: Radiology

## 2023-04-04 DIAGNOSIS — N1832 Chronic kidney disease, stage 3b: Secondary | ICD-10-CM | POA: Diagnosis not present

## 2023-04-04 DIAGNOSIS — N200 Calculus of kidney: Secondary | ICD-10-CM

## 2023-04-04 DIAGNOSIS — R0602 Shortness of breath: Secondary | ICD-10-CM | POA: Diagnosis not present

## 2023-04-04 DIAGNOSIS — N179 Acute kidney failure, unspecified: Secondary | ICD-10-CM | POA: Diagnosis not present

## 2023-04-04 DIAGNOSIS — I10 Essential (primary) hypertension: Secondary | ICD-10-CM

## 2023-04-04 DIAGNOSIS — I5022 Chronic systolic (congestive) heart failure: Secondary | ICD-10-CM | POA: Diagnosis not present

## 2023-04-04 LAB — BASIC METABOLIC PANEL
Anion gap: 6 (ref 5–15)
BUN: 44 mg/dL — ABNORMAL HIGH (ref 8–23)
CO2: 21 mmol/L — ABNORMAL LOW (ref 22–32)
Calcium: 8.2 mg/dL — ABNORMAL LOW (ref 8.9–10.3)
Chloride: 115 mmol/L — ABNORMAL HIGH (ref 98–111)
Creatinine, Ser: 1.44 mg/dL — ABNORMAL HIGH (ref 0.44–1.00)
GFR, Estimated: 37 mL/min — ABNORMAL LOW (ref >60)
Glucose, Bld: 110 mg/dL — ABNORMAL HIGH (ref 70–99)
Potassium: 4.7 mmol/L (ref 3.5–5.1)
Sodium: 142 mmol/L (ref 135–145)

## 2023-04-04 LAB — GLUCOSE, CAPILLARY
Glucose-Capillary: 99 mg/dL (ref 70–99)
Glucose-Capillary: 112 mg/dL — ABNORMAL HIGH (ref 70–99)
Glucose-Capillary: 82 mg/dL (ref 70–99)

## 2023-04-04 LAB — CBC
HCT: 34.4 % — ABNORMAL LOW (ref 36.0–46.0)
Hemoglobin: 10.2 g/dL — ABNORMAL LOW (ref 12.0–15.0)
MCH: 29.5 pg (ref 26.0–34.0)
MCHC: 29.7 g/dL — ABNORMAL LOW (ref 30.0–36.0)
MCV: 99.4 fL (ref 80.0–100.0)
Platelets: 169 10*3/uL (ref 150–400)
RBC: 3.46 MIL/uL — ABNORMAL LOW (ref 3.87–5.11)
RDW: 14.8 % (ref 11.5–15.5)
WBC: 8.6 10*3/uL (ref 4.0–10.5)
nRBC: 0 % (ref 0.0–0.2)

## 2023-04-04 LAB — URINE CULTURE

## 2023-04-04 LAB — MAGNESIUM: Magnesium: 2.1 mg/dL (ref 1.7–2.4)

## 2023-04-04 MED ORDER — CEFDINIR 300 MG PO CAPS: 300.0000 mg | ORAL_CAPSULE | Freq: Two times a day (BID) | ORAL | 0 refills | Status: AC

## 2023-04-04 MED ORDER — TRAMADOL HCL 50 MG PO TABS: 50.0000 mg | ORAL_TABLET | Freq: Four times a day (QID) | ORAL | 0 refills | Status: AC | PRN

## 2023-04-04 MED ORDER — IOHEXOL 350 MG/ML SOLN
60.0000 mL | Freq: Once | INTRAVENOUS | Status: AC | PRN
  Administered 2023-04-04: 60 mL via INTRAVENOUS

## 2023-04-04 MED ORDER — FUROSEMIDE 40 MG PO TABS: 60.0000 mg | ORAL_TABLET | Freq: Every day | ORAL | Status: AC

## 2023-04-04 MED ORDER — LORAZEPAM 2 MG/ML IJ SOLN
1.0000 mg | Freq: Once | INTRAMUSCULAR | Status: AC
  Administered 2023-04-04: 1 mg via INTRAVENOUS
  Filled 2023-04-04: qty 1

## 2023-04-04 MED ORDER — CYCLOBENZAPRINE HCL 10 MG PO TABS: 5.0000 mg | ORAL_TABLET | Freq: Three times a day (TID) | ORAL | Status: DC | PRN

## 2023-04-04 MED ORDER — CYCLOBENZAPRINE HCL 5 MG PO TABS: 5.0000 mg | ORAL_TABLET | Freq: Three times a day (TID) | ORAL | 0 refills | Status: AC | PRN

## 2023-04-04 MED ORDER — CEFDINIR 300 MG PO CAPS: 300.0000 mg | ORAL_CAPSULE | Freq: Two times a day (BID) | ORAL | Status: DC

## 2023-04-04 NOTE — Consult Note (Signed)
Urology Consult  Referring physician: Dr. Arbutus Leas Reason for referral: left staghorn calculus  Chief Complaint: left flank pain  History of Present Illness: Teresa Mercado is a 77yo with a history of CHF, CKD, poorly controlled DMII, and nephrolithiasis who was admitted with left sided chest pain. Ct chest was normal. Ct stone study was obtained which showed a left staghorn calculus and no left hydronephrosis. She has bilateral atrophic kidneys. He pain is sharp, intermittent moderate to severe and nonraditing. She denies any significant LUTS. No hematuria or dysuria. She has a hx of an open left pyelolithotomy in 1975. Urine culture is pending. No fevers. Urine culture shows multiple species  Past Medical History:  Diagnosis Date   Chronic combined systolic (congestive) and diastolic (congestive) heart failure (HCC)    a. 09/2015 Echo: normal EF 55%; b. 07/2017 Echo: EF 25%, Gr2 DD, mild lvh, mid ant/inf/infsept/apical AK. Triv MR. mildly to mod dil LA.   CKD (chronic kidney disease), stage III (HCC)    COPD (chronic obstructive pulmonary disease) (HCC)    Not on home o2   Diabetes mellitus without complication (HCC)    On Levemir   Non-ischemic cardiomyopathy (HCC)    a. 07/2017 Echo: EF 25%, Gr2 DD - ? takotsubo.   Non-obstructive CAD (coronary artery disease)    a. 07/2017 Cath: LM nl, LAD 40ost, D1 50, D2 40, LCX 39m, OM1/2 nl, RCA large, nl.   Syncope    a.  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   Past Surgical History:  Procedure Laterality Date   APPENDECTOMY     ESOPHAGOGASTRODUODENOSCOPY N/A 11/27/2015   Procedure: ESOPHAGOGASTRODUODENOSCOPY (EGD);  Surgeon: Christena Deem, MD;  Location: [facility] ENDOSCOPY;  Service: Endoscopy;  Laterality: N/A;   KYPHOPLASTY N/A 02/21/2019   Procedure: KYPHOPLASTY L4 - DIABETIC;  Surgeon: Kennedy Bucker, MD;  Location: [facility] ORS;  Service: Orthopedics;  Laterality: N/A;   LEFT HEART CATH AND CORONARY ANGIOGRAPHY N/A 07/21/2017    Procedure: LEFT HEART CATH AND CORONARY ANGIOGRAPHY;  Surgeon: Yvonne Kendall, MD;  Location: [facility] INVASIVE CV LAB;  Service: Cardiovascular;  Laterality: N/A;   PACEMAKER INSERTION     TONSILLECTOMY     TUBAL LIGATION      Medications: I have reviewed the patient's current medications. Allergies:  Allergies  Allergen Reactions   Quinapril Other (See Comments)    Reaction:  Elevated potassium    Venlafaxine Other (See Comments)    Reaction:  GI upset    Zetia [Ezetimibe] Nausea And Vomiting    Family History  Problem Relation Age of Onset   Kidney cancer Father    Esophageal cancer Brother    Social History:  reports that she quit smoking about 17 years ago. Her smoking use included cigarettes. She has never used smokeless tobacco. She reports that she does not drink alcohol and does not use drugs.  Review of Systems  Genitourinary:  Positive for flank pain.  All other systems reviewed and are negative.   Physical Exam:  Vital signs in last 24 hours: Temp:  [97.7 F (36.5 C)-98.1 F (36.7 C)] 98.1 F (36.7 C) (07/16 0458) Pulse Rate:  [65-79] 79 (07/16 0458) Resp:  [16-20] 18 (07/16 0458) BP: (104-115)/(40-53) 115/53 (07/16 0458) SpO2:  [95 %-96 %] 96 % (07/16 0458) Physical Exam Vitals reviewed.  Constitutional:      Appearance: She is well-developed.  HENT:     Head: Normocephalic and atraumatic.  Eyes:     Extraocular  Movements: Extraocular movements intact.     Pupils: Pupils are equal, round, and reactive to light.  Cardiovascular:     Rate and Rhythm: Normal rate and regular rhythm.  Pulmonary:     Effort: Pulmonary effort is normal. No tachypnea.  Musculoskeletal:        General: Normal range of motion.     Cervical back: Normal range of motion and neck supple.  Skin:    General: Skin is warm and dry.  Neurological:     General: No focal deficit present.     Mental Status: She is alert and oriented to person, place, and time.  Psychiatric:         Mood and Affect: Mood normal.        Behavior: Behavior normal.     Laboratory Data:  Results for orders placed or performed during the hospital encounter of 04/01/23 (from the past 72 hour(s))  D-dimer, quantitative     Status: Abnormal   Collection Time: 04/01/23  9:50 AM  Result Value Ref Range   D-Dimer, Quant 5.52 (H) 0.00 - 0.50 ug/mL-FEU    Comment: (NOTE) At the manufacturer cut-off value of 0.5 g/mL FEU, this assay has a negative predictive value of 95-100%.This assay is intended for use in conjunction with a clinical pretest probability (PTP) assessment model to exclude pulmonary embolism (PE) and deep venous thrombosis (DVT) in outpatients suspected of PE or DVT. Results should be correlated with clinical presentation. Performed at [facility], [address], [place], [place] [address]   Troponin I (High Sensitivity)     Status: None   Collection Time: 04/01/23 10:15 AM  Result Value Ref Range   Troponin I (High Sensitivity) 9 <18 ng/L    Comment: (NOTE) Elevated high sensitivity troponin I (hsTnI) values and significant  changes across serial measurements may suggest ACS but many other  chronic and acute conditions are known to elevate hsTnI results.  Refer to the "Links" section for chest pain algorithms and additional  guidance. Performed at [facility], [address] [place], [place] [address]   Glucose, capillary     Status: Abnormal   Collection Time: 04/01/23  5:42 PM  Result Value Ref Range   Glucose-Capillary 220 (H) 70 - 99 mg/dL    Comment: Glucose reference range applies only to samples taken after fasting for at least 8 hours.  Heparin level (unfractionated)     Status: Abnormal   Collection Time: 04/01/23  7:59 PM  Result Value Ref Range   Heparin Unfractionated 0.96 (H) 0.30 - 0.70 IU/mL    Comment: (NOTE) The clinical reportable range upper limit is being lowered to >1.10 to align with the FDA approved guidance for the current  laboratory assay.  If heparin results are below expected values, and patient dosage has  been confirmed, suggest follow up testing of antithrombin III levels. Performed at [facility], [address], [place], [place] [address]   Hemoglobin A1c     Status: Abnormal   Collection Time: 04/01/23  7:59 PM  Result Value Ref Range   Hgb A1c MFr Bld 6.8 (H) 4.8 - 5.6 %    Comment: (NOTE)         Prediabetes: 5.7 - 6.4         Diabetes: >6.4         Glycemic control for adults with diabetes: <7.0    Mean Plasma Glucose 148 mg/dL    Comment: (NOTE) Performed At: [facility] [address] [place], [place] [id number] [name] MD WG:[id number]   Glucose, capillary     Status: Abnormal   Collection Time: 04/01/23  9:02 PM  Result Value Ref Range   Glucose-Capillary 175 (H) 70 - 99 mg/dL    Comment: Glucose reference range applies only to samples taken after fasting for at least 8 hours.  Heparin level (unfractionated)     Status: Abnormal   Collection Time: 04/02/23  4:38 AM  Result Value Ref Range   Heparin Unfractionated 0.95 (H) 0.30 - 0.70 IU/mL    Comment: (NOTE) The clinical reportable range upper limit is being lowered to >1.10 to align with the FDA approved guidance for the current laboratory assay.  If heparin results are below expected values, and patient dosage has  been confirmed, suggest follow up testing of antithrombin III levels. Performed at [facility], [address], [place], [place] [address]   CBC     Status: Abnormal   Collection Time: 04/02/23  4:38 AM  Result Value Ref Range   WBC 8.6 4.0 - 10.5 K/uL   RBC 3.51 (L) 3.87 - 5.11 MIL/uL   Hemoglobin 10.4 (L) 12.0 - 15.0 g/dL   HCT 62.9 (L) 52.8 - 41.3 %   MCV 95.7 80.0 - 100.0 fL   MCH 29.6 26.0 - 34.0 pg   MCHC 31.0 30.0 - 36.0 g/dL   RDW 24.4 01.0 - 27.2 %   Platelets 208 150 - 400 K/uL   nRBC 0.0 0.0 - 0.2 %    Comment: Performed at [facility], [address] [place], [place]  [address]  Basic metabolic panel     Status: Abnormal   Collection Time: 04/02/23  4:38 AM  Result Value Ref Range   Sodium 139 135 - 145 mmol/L   Potassium 4.6 3.5 - 5.1 mmol/L   Chloride 107 98 - 111 mmol/L   CO2 24 22 - 32 mmol/L   Glucose, Bld 129 (H) 70 - 99 mg/dL    Comment: Glucose reference range applies only to samples taken after fasting for at least 8 hours.   BUN 57 (H) 8 - 23 mg/dL   Creatinine, Ser 4.03 (H) 0.44 - 1.00 mg/dL   Calcium 8.3 (L) 8.9 - 10.3 mg/dL   GFR, Estimated 27 (L) >60 mL/min    Comment: (NOTE) Calculated using the CKD-EPI Creatinine Equation (2021)    Anion gap 8 5 - 15    Comment: Performed at [facility], [address], [place], [place] [address]  Glucose, capillary     Status: Abnormal   Collection Time: 04/02/23  7:44 AM  Result Value Ref Range   Glucose-Capillary 140 (H) 70 - 99 mg/dL    Comment: Glucose reference range applies only to samples taken after fasting for at least 8 hours.  Glucose, capillary     Status: Abnormal   Collection Time: 04/02/23 11:26 AM  Result Value Ref Range   Glucose-Capillary 122 (H) 70 - 99 mg/dL    Comment: Glucose reference range applies only to samples taken after fasting for at least 8 hours.  Urinalysis, w/ Reflex to Culture (Infection Suspected) -Urine, Clean Catch     Status: Abnormal   Collection Time: 04/02/23 12:03 PM  Result Value Ref Range   Specimen Source URINE, CLEAN CATCH    Color, Urine YELLOW YELLOW   APPearance HAZY (A) CLEAR   Specific Gravity, Urine 1.012 1.005 - 1.030   pH 5.0 5.0 - 8.0   Glucose, UA  NEGATIVE NEGATIVE mg/dL   Hgb urine dipstick NEGATIVE NEGATIVE   Bilirubin Urine NEGATIVE NEGATIVE   Ketones, ur NEGATIVE NEGATIVE mg/dL   Protein, ur NEGATIVE NEGATIVE mg/dL   Nitrite POSITIVE (A) NEGATIVE   Leukocytes,Ua MODERATE (A) NEGATIVE   RBC / HPF 0-5 0 - 5 RBC/hpf   WBC, UA 21-50 0 - 5 WBC/hpf    Comment:        Reflex urine culture not performed if WBC <=10, OR if Squamous  epithelial cells >5. If Squamous epithelial cells >5 suggest recollection.    Bacteria, UA MANY (A) NONE SEEN   Squamous Epithelial / HPF 6-10 0 - 5 /HPF   WBC Clumps PRESENT     Comment: Performed at [facility], [address], [place], [place] [address]  Respiratory (~20 pathogens) panel by PCR     Status: None   Collection Time: 04/02/23 12:03 PM   Specimen: Nasopharyngeal Swab; Respiratory  Result Value Ref Range   Adenovirus NOT DETECTED NOT DETECTED   Coronavirus 229E NOT DETECTED NOT DETECTED    Comment: (NOTE) The Coronavirus on the Respiratory Panel, DOES NOT test for the novel  Coronavirus (2019 nCoV)    Coronavirus HKU1 NOT DETECTED NOT DETECTED   Coronavirus NL63 NOT DETECTED NOT DETECTED   Coronavirus OC43 NOT DETECTED NOT DETECTED   Metapneumovirus NOT DETECTED NOT DETECTED   Rhinovirus / Enterovirus NOT DETECTED NOT DETECTED   Influenza A NOT DETECTED NOT DETECTED   Influenza B NOT DETECTED NOT DETECTED   Parainfluenza Virus 1 NOT DETECTED NOT DETECTED   Parainfluenza Virus 2 NOT DETECTED NOT DETECTED   Parainfluenza Virus 3 NOT DETECTED NOT DETECTED   Parainfluenza Virus 4 NOT DETECTED NOT DETECTED   Respiratory Syncytial Virus NOT DETECTED NOT DETECTED   Bordetella pertussis NOT DETECTED NOT DETECTED   Bordetella Parapertussis NOT DETECTED NOT DETECTED   Chlamydophila pneumoniae NOT DETECTED NOT DETECTED   Mycoplasma pneumoniae NOT DETECTED NOT DETECTED    Comment: Performed at [facility], [address] [place], [place] [address]  Urine Culture (for pregnant, neutropenic or urologic patients or patients with an indwelling urinary catheter)     Status: Abnormal   Collection Time: 04/02/23 12:03 PM   Specimen: Urine, Clean Catch  Result Value Ref Range   Specimen Description      URINE, CLEAN CATCH Performed at [facility], [address] [place] [address] [place], [place] [address]    Special Requests      NONE Performed at [facility], [address],  [place], [place] [address]    Culture MULTIPLE SPECIES PRESENT, SUGGEST RECOLLECTION (A)    Report Status 04/04/2023 FINAL   Glucose, capillary     Status: Abnormal   Collection Time: 04/02/23  3:56 PM  Result Value Ref Range   Glucose-Capillary 104 (H) 70 - 99 mg/dL    Comment: Glucose reference range applies only to samples taken after fasting for at least 8 hours.  Glucose, capillary     Status: Abnormal   Collection Time: 04/02/23  8:36 PM  Result Value Ref Range   Glucose-Capillary 119 (H) 70 - 99 mg/dL    Comment: Glucose reference range applies only to samples taken after fasting for at least 8 hours.  CBC     Status: Abnormal   Collection Time: 04/03/23  5:16 AM  Result Value Ref Range   WBC 7.0 4.0 - 10.5 K/uL   RBC 3.27 (L) 3.87 - 5.11 MIL/uL   Hemoglobin 9.7 (L)  12.0 - 15.0 g/dL   HCT 16.1 (L) 09.6 - 04.5 %   MCV 98.5 80.0 - 100.0 fL   MCH 29.7 26.0 - 34.0 pg   MCHC 30.1 30.0 - 36.0 g/dL   RDW 40.9 81.1 - 91.4 %   Platelets 165 150 - 400 K/uL   nRBC 0.0 0.0 - 0.2 %    Comment: Performed at [facility], [address] [place], [place] [address]  Basic metabolic panel     Status: Abnormal   Collection Time: 04/03/23  5:16 AM  Result Value Ref Range   Sodium 140 135 - 145 mmol/L   Potassium 4.7 3.5 - 5.1 mmol/L   Chloride 112 (H) 98 - 111 mmol/L   CO2 22 22 - 32 mmol/L   Glucose, Bld 139 (H) 70 - 99 mg/dL    Comment: Glucose reference range applies only to samples taken after fasting for at least 8 hours.   BUN 50 (H) 8 - 23 mg/dL   Creatinine, Ser 6.21 (H) 0.44 - 1.00 mg/dL   Calcium 7.9 (L) 8.9 - 10.3 mg/dL   GFR, Estimated 29 (L) >60 mL/min    Comment: (NOTE) Calculated using the CKD-EPI Creatinine Equation (2021)    Anion gap 6 5 - 15    Comment: Performed at [facility], [address], [place], [place] [address]  Magnesium     Status: None   Collection Time: 04/03/23  5:16 AM  Result Value Ref Range   Magnesium 1.8 1.7 - 2.4 mg/dL    Comment: Performed at  [facility], [address], [place], [place] [address]  Procalcitonin     Status: None   Collection Time: 04/03/23  7:04 AM  Result Value Ref Range   Procalcitonin <0.10 ng/mL    Comment:        Interpretation: PCT (Procalcitonin) <= 0.5 ng/mL: Systemic infection (sepsis) is not likely. Local bacterial infection is possible. (NOTE)       Sepsis PCT Algorithm           Lower Respiratory Tract                                      Infection PCT Algorithm    ----------------------------     ----------------------------         PCT < 0.25 ng/mL                PCT < 0.10 ng/mL          Strongly encourage             Strongly discourage   discontinuation of antibiotics    initiation of antibiotics    ----------------------------     -----------------------------       PCT 0.25 - 0.50 ng/mL            PCT 0.10 - 0.25 ng/mL               OR       >80% decrease in PCT            Discourage initiation of                                            antibiotics      Encourage discontinuation  of antibiotics    ----------------------------     -----------------------------         PCT >= 0.50 ng/mL              PCT 0.26 - 0.50 ng/mL               AND        <80% decrease in PCT             Encourage initiation of                                             antibiotics       Encourage continuation           of antibiotics    ----------------------------     -----------------------------        PCT >= 0.50 ng/mL                  PCT > 0.50 ng/mL               AND         increase in PCT                  Strongly encourage                                      initiation of antibiotics    Strongly encourage escalation           of antibiotics                                     -----------------------------                                           PCT <= 0.25 ng/mL                                                 OR                                        > 80% decrease in PCT                                       Discontinue / Do not initiate                                             antibiotics  Performed at [facility], [address] [place], [place] [address]   Culture, blood (Routine X 2) w Reflex to ID Panel     Status: None (Preliminary result)   Collection Time: 04/03/23  7:06 AM   Specimen: BLOOD  Result Value Ref Range   Specimen Description BLOOD BLOOD LEFT ARM    Special Requests      BOTTLES DRAWN AEROBIC AND ANAEROBIC Blood Culture adequate volume   Culture      NO GROWTH 1 DAY Performed at [facility], [address], [place], [place] [address]    Report Status PENDING   Lactic acid, plasma     Status: None   Collection Time: 04/03/23  7:06 AM  Result Value Ref Range   Lactic Acid, Venous 0.9 0.5 - 1.9 mmol/L    Comment: Performed at [facility], [address], [place], [place] [address]  Glucose, capillary     Status: Abnormal   Collection Time: 04/03/23  7:21 AM  Result Value Ref Range   Glucose-Capillary 129 (H) 70 - 99 mg/dL    Comment: Glucose reference range applies only to samples taken after fasting for at least 8 hours.  Glucose, capillary     Status: Abnormal   Collection Time: 04/03/23 11:27 AM  Result Value Ref Range   Glucose-Capillary 199 (H) 70 - 99 mg/dL    Comment: Glucose reference range applies only to samples taken after fasting for at least 8 hours.  Glucose, capillary     Status: Abnormal   Collection Time: 04/03/23  4:47 PM  Result Value Ref Range   Glucose-Capillary 108 (H) 70 - 99 mg/dL    Comment: Glucose reference range applies only to samples taken after fasting for at least 8 hours.  Glucose, capillary     Status: Abnormal   Collection Time: 04/03/23  8:28 PM  Result Value Ref Range   Glucose-Capillary 129 (H) 70 - 99 mg/dL    Comment: Glucose reference range applies only to samples taken after fasting for at least 8 hours.   Comment 1 Notify RN    Comment 2 Document in Chart   CBC     Status: Abnormal    Collection Time: 04/04/23  4:50 AM  Result Value Ref Range   WBC 8.6 4.0 - 10.5 K/uL   RBC 3.46 (L) 3.87 - 5.11 MIL/uL   Hemoglobin 10.2 (L) 12.0 - 15.0 g/dL   HCT 29.5 (L) 62.1 - 30.8 %   MCV 99.4 80.0 - 100.0 fL   MCH 29.5 26.0 - 34.0 pg   MCHC 29.7 (L) 30.0 - 36.0 g/dL   RDW 65.7 84.6 - 96.2 %   Platelets 169 150 - 400 K/uL   nRBC 0.0 0.0 - 0.2 %    Comment: Performed at [facility], [address] [place], [place] [address]  Basic metabolic panel     Status: Abnormal   Collection Time: 04/04/23  4:50 AM  Result Value Ref Range   Sodium 142 135 - 145 mmol/L   Potassium 4.7 3.5 - 5.1 mmol/L   Chloride 115 (H) 98 - 111 mmol/L   CO2 21 (L) 22 - 32 mmol/L   Glucose, Bld 110 (H) 70 - 99 mg/dL    Comment: Glucose reference range applies only to samples taken after fasting for at least 8 hours.   BUN 44 (H) 8 - 23 mg/dL   Creatinine, Ser 1.32 (H) 0.44 - 1.00 mg/dL   Calcium 8.2 (L) 8.9 - 10.3 mg/dL   GFR, Estimated 37 (L) >60 mL/min    Comment: (NOTE) Calculated using the CKD-EPI Creatinine Equation (2021)    Anion gap 6 5 - 15    Comment: Performed at [facility], [address], [place],  [place] [address]  Magnesium     Status: None   Collection Time: 04/04/23  4:50 AM  Result Value Ref Range   Magnesium 2.1 1.7 - 2.4 mg/dL    Comment: Performed at [facility], [address] [place], [place] [address]  Glucose, capillary     Status: None   Collection Time: 04/04/23  7:30 AM  Result Value Ref Range   Glucose-Capillary 99 70 - 99 mg/dL    Comment: Glucose reference range applies only to samples taken after fasting for at least 8 hours.   Comment 1 Notify RN    Comment 2 Document in Chart    Recent Results (from the past 240 hour(s))  Respiratory (~20 pathogens) panel by PCR     Status: None   Collection Time: 04/02/23 12:03 PM   Specimen: Nasopharyngeal Swab; Respiratory  Result Value Ref Range Status   Adenovirus NOT DETECTED NOT DETECTED Final   Coronavirus 229E  NOT DETECTED NOT DETECTED Final    Comment: (NOTE) The Coronavirus on the Respiratory Panel, DOES NOT test for the novel  Coronavirus (2019 nCoV)    Coronavirus HKU1 NOT DETECTED NOT DETECTED Final   Coronavirus NL63 NOT DETECTED NOT DETECTED Final   Coronavirus OC43 NOT DETECTED NOT DETECTED Final   Metapneumovirus NOT DETECTED NOT DETECTED Final   Rhinovirus / Enterovirus NOT DETECTED NOT DETECTED Final   Influenza A NOT DETECTED NOT DETECTED Final   Influenza B NOT DETECTED NOT DETECTED Final   Parainfluenza Virus 1 NOT DETECTED NOT DETECTED Final   Parainfluenza Virus 2 NOT DETECTED NOT DETECTED Final   Parainfluenza Virus 3 NOT DETECTED NOT DETECTED Final   Parainfluenza Virus 4 NOT DETECTED NOT DETECTED Final   Respiratory Syncytial Virus NOT DETECTED NOT DETECTED Final   Bordetella pertussis NOT DETECTED NOT DETECTED Final   Bordetella Parapertussis NOT DETECTED NOT DETECTED Final   Chlamydophila pneumoniae NOT DETECTED NOT DETECTED Final   Mycoplasma pneumoniae NOT DETECTED NOT DETECTED Final    Comment: Performed at [facility], [address], [place], [place] [address]  Urine Culture (for pregnant, neutropenic or urologic patients or patients with an indwelling urinary catheter)     Status: Abnormal   Collection Time: 04/02/23 12:03 PM   Specimen: Urine, Clean Catch  Result Value Ref Range Status   Specimen Description   Final    URINE, CLEAN CATCH Performed at [facility], [address], [place], [place] [address]    Special Requests   Final    NONE Performed at [facility], [address] [place], [place] [address]    Culture MULTIPLE SPECIES PRESENT, SUGGEST RECOLLECTION (A)  Final   Report Status 04/04/2023 FINAL  Final  Culture, blood (Routine X 2) w Reflex to ID Panel     Status: None (Preliminary result)   Collection Time: 04/03/23  7:06 AM   Specimen: BLOOD  Result Value Ref Range Status   Specimen Description BLOOD BLOOD LEFT ARM  Final   Special  Requests   Final    BOTTLES DRAWN AEROBIC AND ANAEROBIC Blood Culture adequate volume   Culture   Final    NO GROWTH 1 DAY Performed at [facility], [address] [place], [place] [address]    Report Status PENDING  Incomplete   Creatinine: Recent Labs    04/01/23 0814 04/02/23 0438 04/03/23 0516 04/04/23 0450  CREATININE 1.93* 1.91* 1.78* 1.44*   Baseline Creatinine: 1.4  Impression/Assessment:  77yo with left staghorn calculus  Plan:  -We discussed the management of kidney stones. These options include observation, ureteroscopy, shockwave lithotripsy (ESWL) and percutaneous nephrolithotomy (PCNL). We discussed which options are relevant to the patient's stone(s). We discussed the natural history of kidney stones as well as the complications of untreated stones and the impact on quality of life without treatment as well as with each of the above listed treatments. We also discussed the efficacy of each treatment in its ability to clear the stone burden. With any of these management options I discussed the signs and symptoms of infection and the need for emergent treatment should these be experienced. For each option we discussed the ability of each procedure to clear the patient of their stone burden.   For observation I described the risks which include but are not limited to silent renal damage, life-threatening infection, need for emergent surgery, failure to pass stone and pain.   For ureteroscopy I described the risks which include bleeding, infection, damage to contiguous structures, positioning injury, ureteral stricture, ureteral avulsion, ureteral injury, need for prolonged ureteral stent, inability to perform ureteroscopy, need for an interval procedure, inability to clear stone burden, stent discomfort/pain, heart attack, stroke, pulmonary embolus and the inherent risks with general anesthesia.   For shockwave lithotripsy I described the risks which include arrhythmia,  kidney contusion, kidney hemorrhage, need for transfusion, pain, inability to adequately break up stone, inability to pass stone fragments, Steinstrasse, infection associated with obstructing stones, need for alternate surgical procedure, need for repeat shockwave lithotripsy, MI, CVA, PE and the inherent risks with anesthesia/conscious sedation.   For PCNL I described the risks including positioning injury, pneumothorax, hydrothorax, need for chest tube, inability to clear stone burden, renal laceration, arterial venous fistula or malformation, need for embolization of kidney, loss of kidney or renal function, need for repeat procedure, need for prolonged nephrostomy tube, ureteral avulsion, MI, CVA, PE and the inherent risks of general anesthesia.   - The patient would like to proceed with observation since the stone is nonobstructing. Her pain is unlikely related to the calculus. Please repeat urine culture.  Wilkie Aye 04/04/2023, 9:16 AM

## 2023-04-04 NOTE — Discharge Summary (Signed)
Physician Discharge Summary   Patient: Teresa Mercado MRN: [id number] DOB: 05-10-1946  Admit date:     04/01/2023  Discharge date: 04/04/23  Discharge Physician: Onalee Hua Kingjames Coury   PCP: Toma Deiters, MD   Recommendations at discharge:   Please follow up with primary care provider within 1-2 weeks  Please repeat BMP and CBC in one week    Hospital Course: No notes on file  Assessment and Plan: Acute on chronic respiratory failure with hypoxia -Suspect this is secondary to the patient's underlying COPD and  hypoventilation secondary to her pleuritic chest pain. -Clinical suspicion for PE is very low despite elevated D-dimer -Discontinue heparin drip -V/Q scan negative for PE -Patient is chronically on 2 L at nighttime -Currently stable on 2 L>>weaned to RA on 7/15 -CTA chest--no PE as discussed above -viral respiratory panel--neg   Left flank pain -Obtain UA 21-50 WBC -CT renal stone protocol--large left staghorn calculus, AF levels in nondilated SB loops -CTA chest--no PE; small bilateral pleural effusions; no masses -largely musculoskeletal -prn tramadol   Left Staghorn renal calculus -urology consult appreciated -UA 21-50 WBC -discussed with Dr. Ronne Binning -urine culture = polymicrobial -d/c home with cefdinir -continue observation for now   Pleuritic chest pain -Suspect this is primarily musculoskeletal etiology -No visible rash noted on exam -Judicious opioids--prn tramadol -prn flexeril -04/03/23 echo--EF 60-65%, no WMA, G3DD, normal RVF -Troponins negative 10>>9   Chronic HFimpEF -07/21/2017 echo EF 25% -09/21/2018 echo EF 55-60%, +WMA -Clinically euvolemic -holding home dose furosemide temporarily -04/03/23 echo--EF 60-65%, no WMA, G3DD, normal RVF -held lasix -restart lasix after d/c   Elevated D-dimer -VQ scan negative -7/16 CTA chest--no PE -Ultrasound lower extremities negative for DVT -Discontinue heparin drip   CKD stage IIIb -Baseline creatinine  1.5-1.8 -restart lasix after d/c -Monitor BMP   Mixed hyperlipidemia -Continue statin   Diabetes mellitus type 2 with hyperglycemia -04/01/23 hemoglobin A1c--6.8 -NovoLog sliding scale -Reduced dose Levemir during hospitalization   Coronary artery disease -Presentation not consistent with ACS -Continue aspirin -Continue statin   Class 2 obesity -BMI 35.66 -lifestyle modification       Pain control - [place] Controlled Substance Reporting System database was reviewed. and patient was instructed, not to drive, operate heavy machinery, perform activities at heights, swimming or participation in water activities or provide baby-sitting services while on Pain, Sleep and Anxiety Medications; until their outpatient Physician has advised to do so again. Also recommended to not to take more than prescribed Pain, Sleep and Anxiety Medications.  Consultants: urology Procedures performed: none  Disposition: Home Diet recommendation:  Cardiac diet DISCHARGE MEDICATION: Allergies as of 04/04/2023       Reactions   Quinapril Other (See Comments)   Reaction:  Elevated potassium    Venlafaxine Other (See Comments)   Reaction:  GI upset    Zetia [ezetimibe] Nausea And Vomiting        Medication List     STOP taking these medications    insulin detemir 100 UNIT/ML injection Commonly known as: LEVEMIR   lisinopril 20 MG tablet Commonly known as: ZESTRIL       TAKE these medications    albuterol 108 (90 Base) MCG/ACT inhaler Commonly known as: VENTOLIN HFA Inhale 1-2 puffs into the lungs every 6 (six) hours as needed for wheezing or shortness of breath.   aspirin EC 81 MG tablet Take 81 mg by mouth daily. Swallow whole.   atorvastatin 80 MG tablet Commonly known as: LIPITOR Take 1 tablet (80 mg  total) by mouth daily.   cefdinir 300 MG capsule Commonly known as: OMNICEF Take 1 capsule (300 mg total) by mouth every 12 (twelve) hours.   cyanocobalamin 1000 MCG  tablet Commonly known as: VITAMIN B12 Take 1,000 mcg by mouth daily.   cyclobenzaprine 5 MG tablet Commonly known as: FLEXERIL Take 1 tablet (5 mg total) by mouth 3 (three) times daily as needed for muscle spasms.   ferrous sulfate 324 MG Tbec Take 324 mg by mouth daily with breakfast.   Fish Oil 1200 MG Caps Take 1 capsule by mouth 2 (two) times daily.   furosemide 40 MG tablet Commonly known as: LASIX Take 1.5 tablets (60 mg total) by mouth daily. Restart 04/07/23 What changed: additional instructions   insulin aspart 100 UNIT/ML FlexPen Commonly known as: NOVOLOG Inject 16 Units into the skin 2 (two) times daily.   Lantus SoloStar 100 UNIT/ML Solostar Pen Generic drug: insulin glargine Inject 10-20 Units into the skin 2 (two) times daily. Inject 20 units in the morning and 10 units at bedtime   traMADol 50 MG tablet Commonly known as: ULTRAM Take 1 tablet (50 mg total) by mouth every 6 (six) hours as needed for moderate pain.   VITAMIN D-3 PO Take 1 capsule by mouth daily.        Discharge Exam: Filed Weights   04/01/23 0811 04/01/23 1712  Weight: 99.8 kg 91.3 kg   HEENT:  [place]/AT, No thrush, no icterus CV:  RRR, no rub, no S3, no S4 Lung:  bibasilar crackles. No wheeze Abd:  soft/+BS, NT Ext:  No edema, no lymphangitis, no synovitis, no rash   Condition at discharge: stable  The results of significant diagnostics from this hospitalization (including imaging, microbiology, ancillary and laboratory) are listed below for reference.   Imaging Studies: CT Angio Chest Pulmonary Embolism (PE) W or WO Contrast  Result Date: 04/04/2023 CLINICAL DATA:  Chest pain, elevated D-dimer level. EXAM: CT ANGIOGRAPHY CHEST WITH CONTRAST TECHNIQUE: Multidetector CT imaging of the chest was performed using the standard protocol during bolus administration of intravenous contrast. Multiplanar CT image reconstructions and MIPs were obtained to evaluate the vascular anatomy.  RADIATION DOSE REDUCTION: This exam was performed according to the departmental dose-optimization program which includes automated exposure control, adjustment of the mA and/or kV according to patient size and/or use of iterative reconstruction technique. CONTRAST:  60mL OMNIPAQUE IOHEXOL 350 MG/ML SOLN COMPARISON:  April 01, 2023. FINDINGS: Cardiovascular: Satisfactory opacification of the pulmonary arteries to the segmental level. No evidence of pulmonary embolism. Mild cardiomegaly. No pericardial effusion. Coronary artery calcifications are noted. Mediastinum/Nodes: No enlarged mediastinal, hilar, or axillary lymph nodes. Thyroid gland, trachea, and esophagus demonstrate no significant findings. Lungs/Pleura: No pneumothorax is noted. Emphysematous disease is noted bilaterally. Small bilateral pleural effusions are noted with minimal adjacent subsegmental atelectasis. Upper Abdomen: No acute abnormality. Musculoskeletal: No chest wall abnormality. No acute or significant osseous findings. Review of the MIP images confirms the above findings. IMPRESSION: No definite evidence of pulmonary embolus. Small bilateral pleural effusions are noted with minimal adjacent subsegmental atelectasis. Coronary artery calcifications are noted suggesting coronary artery disease. Aortic Atherosclerosis (ICD10-I70.0) and Emphysema (ICD10-J43.9). Electronically Signed   By: Lupita Raider M.D.   On: 04/04/2023 14:53   ECHOCARDIOGRAM COMPLETE  Result Date: 04/03/2023    ECHOCARDIOGRAM REPORT   Patient Name:   EMY ANGEVINE Date of Exam: 04/03/2023 Medical Rec #:  [id number]   Height:       63.0 in Accession #:  [id number]  Weight:       201.3 lb Date of Birth:  05/22/1946   BSA:          1.939 m Patient Age:    77 years    BP:           93/40 mmHg Patient Gender: F           HR:           68 bpm. [facility] Procedure: 2D Echo, Cardiac Doppler and Color Doppler Indications:    Chest Pain R07.9  History:         Patient has prior history of Echocardiogram examinations, most                 recent 09/22/2018. CHF and Cardiomyopathy, Pacemaker; Risk                 Factors:Hypertension and Diabetes.  Sonographer:    Celesta Gentile RCS Referring Phys: [phone] Tiare Rohlman IMPRESSIONS  1. Left ventricular ejection fraction, by estimation, is 60 to 65%. The left ventricle has normal function. The left ventricle has no regional wall motion abnormalities. Left ventricular diastolic parameters are consistent with Grade III diastolic dysfunction (restrictive).  2. Right ventricular systolic function is normal. The right ventricular size is normal. Tricuspid regurgitation signal is inadequate for assessing PA pressure.  3. Left atrial size was moderately dilated.  4. The mitral valve is abnormal. No evidence of mitral valve regurgitation. No evidence of mitral stenosis. Severe mitral annular calcification.  5. The aortic valve is tricuspid. Aortic valve regurgitation is not visualized. No aortic stenosis is present.  6. The inferior vena cava is dilated in size with >50% respiratory variability, suggesting right atrial pressure of 8 mmHg. FINDINGS  Left Ventricle: Left ventricular ejection fraction, by estimation, is 60 to 65%. The left ventricle has normal function. The left ventricle has no regional wall motion abnormalities. The left ventricular internal cavity size was normal in size. There is  no left ventricular hypertrophy. Left ventricular diastolic parameters are consistent with Grade III diastolic dysfunction (restrictive). Right Ventricle: The right ventricular size is normal. No increase in right ventricular wall thickness. Right ventricular systolic function is normal. Tricuspid regurgitation signal is inadequate for assessing PA pressure. Left Atrium: Left atrial size was moderately dilated. Right Atrium: Right atrial size was normal in size. Pericardium: There is no evidence of pericardial effusion. Mitral Valve: The mitral  valve is abnormal. Severe mitral annular calcification. No evidence of mitral valve regurgitation. No evidence of mitral valve stenosis. Tricuspid Valve: The tricuspid valve is grossly normal. Tricuspid valve regurgitation is not demonstrated. No evidence of tricuspid stenosis. Aortic Valve: The aortic valve is tricuspid. Aortic valve regurgitation is not visualized. No aortic stenosis is present. Pulmonic Valve: The pulmonic valve was not well visualized. Pulmonic valve regurgitation is not visualized. No evidence of pulmonic stenosis. Aorta: The aortic root is normal in size and structure. Venous: The inferior vena cava is dilated in size with greater than 50% respiratory variability, suggesting right atrial pressure of 8 mmHg. IAS/Shunts: No atrial level shunt detected by color flow Doppler. Additional Comments: A device lead is visualized.  LEFT VENTRICLE PLAX 2D LVIDd:         5.70 cm   Diastology LVIDs:         3.90 cm   LV e' medial:    4.35 cm/s LV PW:         1.20 cm  LV E/e' medial:  35.4 LV IVS:        1.10 cm   LV e' lateral:   5.98 cm/s LVOT diam:     1.65 cm   LV E/e' lateral: 25.8 LV SV:         46 LV SV Index:   24 LVOT Area:     2.14 cm  RIGHT VENTRICLE RV S prime:     8.49 cm/s TAPSE (M-mode): 1.9 cm LEFT ATRIUM             Index        RIGHT ATRIUM           Index LA diam:        4.90 cm 2.53 cm/m   RA Area:     18.40 cm LA Vol (A2C):   80.3 ml 41.42 ml/m  RA Volume:   50.00 ml  25.79 ml/m LA Vol (A4C):   73.4 ml 37.86 ml/m LA Biplane Vol: 82.2 ml 42.40 ml/m  AORTIC VALVE LVOT Vmax:   87.50 cm/s LVOT Vmean:  53.600 cm/s LVOT VTI:    0.215 m  AORTA Ao Root diam: 3.30 cm MITRAL VALVE MV Area (PHT): 3.91 cm     SHUNTS MV Decel Time: 194 msec     Systemic VTI:  0.22 m MV E velocity: 154.00 cm/s  Systemic Diam: 1.65 cm MV A velocity: 74.70 cm/s MV E/A ratio:  2.06 Vishnu Priya Mallipeddi Electronically signed by Winfield Rast Mallipeddi Signature Date/Time: 04/03/2023/5:16:11 PM    Final    CT  RENAL STONE STUDY  Result Date: 04/02/2023 CLINICAL DATA:  Abdominal and flank pain on the left EXAM: CT ABDOMEN AND PELVIS WITHOUT CONTRAST TECHNIQUE: Multidetector CT imaging of the abdomen and pelvis was performed following the standard protocol without IV contrast. RADIATION DOSE REDUCTION: This exam was performed according to the departmental dose-optimization program which includes automated exposure control, adjustment of the mA and/or kV according to patient size and/or use of iterative reconstruction technique. COMPARISON:  11/25/2015 FINDINGS: Lower chest: Left anterior descending and right coronary artery atherosclerosis. Facial leads noted in the right atrium and ventricle. Mitral valve calcification. Descending thoracic aortic atherosclerotic vascular calcification. Mild cardiomegaly. Small type 1 hiatal hernia. Mild scarring in both lower lobes along with peripheral atelectasis in both lower lobes. Mild lingular scarring. Hepatobiliary: No significant findings. Pancreas: Unremarkable Spleen: Unremarkable Adrenals/Urinary Tract: Both adrenal glands appear normal. Cortical thinning of both kidneys compatible with atrophy. 1.8 by 1.9 cm lesion, likely a staghorn calculus, along the left kidney lower pole. No hydronephrosis or hydroureter. Stomach/Bowel: Descending and sigmoid colon diverticulosis. There some air-fluid levels in nondilated loops of proximal small bowel, possibly incidental but low-grade enteritis or mild proximal ileus could have a similar appearance. Borderline prominence of stool in the colon. Vascular/Lymphatic: Atherosclerosis is present, including aortoiliac atherosclerotic disease. No pathologic adenopathy. Substantial atheromatous plaque at the origin of the SMA. Reproductive: Left fundal calcified fibroid. Other: No supplemental non-categorized findings. Musculoskeletal: Chronic compression fracture at L4 with interval vertebral augmentation. Interval (since lumbar spine CT  01/17/2019) 2superior endplate compression fracture L3 with 40% loss of vertebral height. Schmorl's node along the superior endplate of L2. IMPRESSION: 1. Air-fluid levels in nondilated loops of proximal small bowel, possibly incidental but low-grade enteritis or mild proximal ileus could have a similar appearance. 2. Descending and sigmoid colon diverticulosis. 3. Interval (since lumbar spine CT 01/17/2019) but likely chronic superior endplate compression fracture at L3 with 40% loss of vertebral height.  Old L4 compression fracture with vertebral augmentation. 4. Aortic and coronary atherosclerosis. Mitral valve calcification and mild cardiomegaly. 5. Small type 1 hiatal hernia. 6. Bilateral renal atrophy. Large staghorn calculus of the left kidney lower pole with adjacent scarring. 7. Left fundal calcified fibroid. Aortic Atherosclerosis (ICD10-I70.0). Electronically Signed   By: Gaylyn Rong M.D.   On: 04/02/2023 17:28   US Venous Img Lower Bilateral (DVT)  Result Date: 04/02/2023 CLINICAL DATA:  Chest pain and elevated D-dimer. EXAM: BILATERAL LOWER EXTREMITY VENOUS DOPPLER ULTRASOUND TECHNIQUE: Gray-scale sonography with graded compression, as well as color Doppler and duplex ultrasound were performed to evaluate the lower extremity deep venous systems from the level of the common femoral vein and including the common femoral, femoral, profunda femoral, popliteal and calf veins including the posterior tibial, peroneal and gastrocnemius veins when visible. The superficial great saphenous vein was also interrogated. Spectral Doppler was utilized to evaluate flow at rest and with distal augmentation maneuvers in the common femoral, femoral and popliteal veins. COMPARISON:  None Available. FINDINGS: RIGHT LOWER EXTREMITY Common Femoral Vein: No evidence of thrombus. Normal compressibility, respiratory phasicity and response to augmentation. Saphenofemoral Junction: No evidence of thrombus. Normal  compressibility and flow on color Doppler imaging. Profunda Femoral Vein: No evidence of thrombus. Normal compressibility and flow on color Doppler imaging. Femoral Vein: No evidence of thrombus. Normal compressibility, respiratory phasicity and response to augmentation. Popliteal Vein: No evidence of thrombus. Normal compressibility, respiratory phasicity and response to augmentation. Calf Veins: No evidence of thrombus. Normal compressibility and flow on color Doppler imaging. Superficial Great Saphenous Vein: No evidence of thrombus. Normal compressibility. Venous Reflux:  None. Other Findings: No evidence of superficial thrombophlebitis or abnormal fluid collection. LEFT LOWER EXTREMITY Common Femoral Vein: No evidence of thrombus. Normal compressibility, respiratory phasicity and response to augmentation. Saphenofemoral Junction: No evidence of thrombus. Normal compressibility and flow on color Doppler imaging. Profunda Femoral Vein: No evidence of thrombus. Normal compressibility and flow on color Doppler imaging. Femoral Vein: No evidence of thrombus. Normal compressibility, respiratory phasicity and response to augmentation. Popliteal Vein: No evidence of thrombus. Normal compressibility, respiratory phasicity and response to augmentation. Calf Veins: No evidence of thrombus. Normal compressibility and flow on color Doppler imaging. Superficial Great Saphenous Vein: No evidence of thrombus. Normal compressibility. Venous Reflux:  None. Other Findings: No evidence of superficial thrombophlebitis or abnormal fluid collection. IMPRESSION: No evidence of deep venous thrombosis in either lower extremity. Electronically Signed   By: Irish Lack M.D.   On: 04/02/2023 10:33   NM Pulmonary Perfusion  Result Date: 04/01/2023 CLINICAL DATA:  Shortness of breath left-sided chest pain EXAM: NUCLEAR MEDICINE PERFUSION LUNG SCAN TECHNIQUE: Perfusion images were obtained in multiple projections after intravenous  injection of radiopharmaceutical. Ventilation scans intentionally deferred if perfusion scan and chest x-ray adequate for interpretation during COVID 19 epidemic. RADIOPHARMACEUTICALS:  4.0 mCi Tc-79m MAA IV COMPARISON:  Chest x-ray 04/01/2023 FINDINGS: Nonsegmental round defect in the left mid chest inconsistently visualized. No significant wedge-shaped perfusion defects are visualized. IMPRESSION: No significant wedge-shaped perfusion defects. Pulmonary embolism absent per Pisaped criteria Electronically Signed   By: Jasmine Pang M.D.   On: 04/01/2023 15:35   CT Chest Wo Contrast  Result Date: 04/01/2023 CLINICAL DATA:  Chronic shortness of breath. Left-sided back pain radiating to the ribs for the past week. No injury. EXAM: CT CHEST WITHOUT CONTRAST TECHNIQUE: Multidetector CT imaging of the chest was performed following the standard protocol without IV contrast. RADIATION DOSE REDUCTION: This exam was  performed according to the departmental dose-optimization program which includes automated exposure control, adjustment of the mA and/or kV according to patient size and/or use of iterative reconstruction technique. COMPARISON:  Chest x-ray from same day. FINDINGS: Cardiovascular: Mild cardiomegaly. No pericardial effusion. Dense calcification of the aortic and mitral valves. No thoracic aortic aneurysm. Coronary, aortic arch, and branch vessel atherosclerotic vascular disease. Left chest wall pacemaker. Mediastinum/Nodes: No enlarged mediastinal or axillary lymph nodes. Thyroid gland, trachea, and esophagus demonstrate no significant findings. Lungs/Pleura: Mild centrilobular and paraseptal emphysema. Mild bibasilar atelectasis and linear scarring. No focal consolidation, pleural effusion, or pneumothorax. No suspicious pulmonary nodule. Upper Abdomen: No acute abnormality. Chronic left renal atrophy and dystrophic calcification in the lower pole. Musculoskeletal: No acute or significant osseous findings.  IMPRESSION: 1. No acute intrathoracic process. 2. Aortic Atherosclerosis (ICD10-I70.0) and Emphysema (ICD10-J43.9). Electronically Signed   By: Obie Dredge M.D.   On: 04/01/2023 10:50   DG Chest Port 1 View  Result Date: 04/01/2023 CLINICAL DATA:  Shortness of breath. Left-sided chest and back pain for 1 week. EXAM: PORTABLE CHEST 1 VIEW COMPARISON:  09/20/2018 FINDINGS: Stable cardiomegaly. Permanent pacemaker remains in appropriate position. Mild bibasilar scarring remains stable. No evidence of acute infiltrate or edema. No evidence of pleural effusion. IMPRESSION: Stable mild cardiomegaly and bibasilar scarring.  No active disease. Electronically Signed   By: Danae Orleans M.D.   On: 04/01/2023 09:30    Microbiology: Results for orders placed or performed during the hospital encounter of 04/01/23  Culture, blood (Routine X 2) w Reflex to ID Panel     Status: None (Preliminary result)   Collection Time: 03/23/23  7:04 AM   Specimen: BLOOD  Result Value Ref Range Status   Specimen Description BLOOD BLOOD RIGHT HAND  Final   Special Requests   Final    BOTTLES DRAWN AEROBIC AND ANAEROBIC Blood Culture adequate volume   Culture   Final    NO GROWTH 1 DAY Performed at [facility], [address], [place], [place] [address]    Report Status PENDING  Incomplete  Respiratory (~20 pathogens) panel by PCR     Status: None   Collection Time: 04/02/23 12:03 PM   Specimen: Nasopharyngeal Swab; Respiratory  Result Value Ref Range Status   Adenovirus NOT DETECTED NOT DETECTED Final   Coronavirus 229E NOT DETECTED NOT DETECTED Final    Comment: (NOTE) The Coronavirus on the Respiratory Panel, DOES NOT test for the novel  Coronavirus (2019 nCoV)    Coronavirus HKU1 NOT DETECTED NOT DETECTED Final   Coronavirus NL63 NOT DETECTED NOT DETECTED Final   Coronavirus OC43 NOT DETECTED NOT DETECTED Final   Metapneumovirus NOT DETECTED NOT DETECTED Final   Rhinovirus / Enterovirus NOT DETECTED NOT  DETECTED Final   Influenza A NOT DETECTED NOT DETECTED Final   Influenza B NOT DETECTED NOT DETECTED Final   Parainfluenza Virus 1 NOT DETECTED NOT DETECTED Final   Parainfluenza Virus 2 NOT DETECTED NOT DETECTED Final   Parainfluenza Virus 3 NOT DETECTED NOT DETECTED Final   Parainfluenza Virus 4 NOT DETECTED NOT DETECTED Final   Respiratory Syncytial Virus NOT DETECTED NOT DETECTED Final   Bordetella pertussis NOT DETECTED NOT DETECTED Final   Bordetella Parapertussis NOT DETECTED NOT DETECTED Final   Chlamydophila pneumoniae NOT DETECTED NOT DETECTED Final   Mycoplasma pneumoniae NOT DETECTED NOT DETECTED Final    Comment: Performed at [facility], [address], [place], [place] [address]  Urine Culture (for pregnant, neutropenic  or urologic patients or patients with an indwelling urinary catheter)     Status: Abnormal   Collection Time: 04/02/23 12:03 PM   Specimen: Urine, Clean Catch  Result Value Ref Range Status   Specimen Description   Final    URINE, CLEAN CATCH Performed at [facility], [address], [place], [place] [address]    Special Requests   Final    NONE Performed at [facility], [address], [place], [place] [address]    Culture MULTIPLE SPECIES PRESENT, SUGGEST RECOLLECTION (A)  Final   Report Status 04/04/2023 FINAL  Final  Culture, blood (Routine X 2) w Reflex to ID Panel     Status: None (Preliminary result)   Collection Time: 04/03/23  7:06 AM   Specimen: BLOOD  Result Value Ref Range Status   Specimen Description BLOOD BLOOD LEFT ARM  Final   Special Requests   Final    BOTTLES DRAWN AEROBIC AND ANAEROBIC Blood Culture adequate volume   Culture   Final    NO GROWTH 1 DAY Performed at [facility], [address], [place], [place] [address]    Report Status PENDING  Incomplete    Labs: CBC: Recent Labs  Lab 04/01/23 0814 04/02/23 0438 04/03/23 0516 04/04/23 0450  WBC 8.3 8.6 7.0 8.6  NEUTROABS 5.7  --   --   --   HGB 12.1 10.4*  9.7* 10.2*  HCT 38.7 33.6* 32.2* 34.4*  MCV 94.6 95.7 98.5 99.4  PLT 239 208 165 169   Basic Metabolic Panel: Recent Labs  Lab 04/01/23 0814 04/02/23 0438 04/03/23 0516 04/04/23 0450  NA 139 139 140 142  K 4.4 4.6 4.7 4.7  CL 104 107 112* 115*  CO2 24 24 22  21*  GLUCOSE 150* 129* 139* 110*  BUN 70* 57* 50* 44*  CREATININE 1.93* 1.91* 1.78* 1.44*  CALCIUM 9.1 8.3* 7.9* 8.2*  MG  --   --  1.8 2.1   Liver Function Tests: Recent Labs  Lab 04/01/23 0814  AST 22  ALT 22  ALKPHOS 68  BILITOT 0.6  PROT 7.4  ALBUMIN 3.4*   CBG: Recent Labs  Lab 04/03/23 1127 04/03/23 1647 04/03/23 2028 04/04/23 0730 04/04/23 1118  GLUCAP 199* 108* 129* 99 112*    Discharge time spent: greater than 30 minutes.  Signed: Catarina Hartshorn, MD [facility] 04/04/2023

## 2023-04-07 LAB — CULTURE, BLOOD (ROUTINE X 2): Special Requests: ADEQUATE

## 2023-04-08 LAB — CULTURE, BLOOD (ROUTINE X 2)
Culture: NO GROWTH
Culture: NO GROWTH
Special Requests: ADEQUATE

## 2023-04-11 NOTE — Progress Notes (Unsigned)
Name: Teresa Mercado DOB: 05-27-1946 MRN: [id number]  History of Present Illness: Teresa Mercado is a 77 y.o. female who presents today as a new patient at [facility] [place]. All available relevant medical records have been reviewed. She is accompanied by her husband [name] and daughter [name]. - GU history: 1. Kidney stones. - Underwent an open left pyelolithotomy in 1975.  Recent history: Admitted 04/01/2023 - 04/04/2023 for acute on chronic respiratory failure with hypoxia. Had left flank pain and chest pain. CT stone study on 04/02/2023 showed a 1.8 cm x 1.9 cm left staghorn calculus with no hydronephrosis. Dr. Ronne Binning was consulted and spoke with patient in hospital on 04/04/2023. Per note: "The patient would like to proceed with observation since the stone is nonobstructing. Her pain is unlikely related to the calculus." Negative urine culture (appeared contaminated).  Today: She reports left flank pain which is described as mild and consistent. Denies abdominal pain. She reports that over the past few weeks she has had new onset absence of sensation to void and urinary leakage without sensory awareness, including overnight. She reports voiding 3x/day. She wakes up around 3-4 am nightly due to dry mouth and urinates at that time since she's already up. Denies dysuria, gross hematuria, hesitancy, straining to void, or sensations of incomplete emptying. Denies fevers; reports chills. Denies nausea/vomiting.   Fall Screening: Do you usually have a device to assist in your mobility? No   Medications: Current Outpatient Medications  Medication Sig Dispense Refill   albuterol (PROVENTIL HFA;VENTOLIN HFA) 108 (90 Base) MCG/ACT inhaler Inhale 1-2 puffs into the lungs every 6 (six) hours as needed for wheezing or shortness of breath.     aspirin 81 MG EC tablet Take 81 mg by mouth daily. Swallow whole.     atorvastatin (LIPITOR) 80 MG tablet Take 1 tablet (80 mg total) by mouth daily. 90  tablet 4   cefdinir (OMNICEF) 300 MG capsule Take 1 capsule (300 mg total) by mouth every 12 (twelve) hours. 10 capsule 0   Cholecalciferol (VITAMIN D-3 PO) Take 1 capsule by mouth daily.     cyclobenzaprine (FLEXERIL) 5 MG tablet Take 1 tablet (5 mg total) by mouth 3 (three) times daily as needed for muscle spasms. 21 tablet 0   ferrous sulfate 324 MG TBEC Take 324 mg by mouth daily with breakfast.     furosemide (LASIX) 40 MG tablet Take 1.5 tablets (60 mg total) by mouth daily. Restart 04/07/23     insulin aspart (NOVOLOG) 100 UNIT/ML FlexPen Inject 16 Units into the skin 2 (two) times daily.      insulin glargine (LANTUS SOLOSTAR) 100 UNIT/ML Solostar Pen Inject 10-20 Units into the skin 2 (two) times daily. Inject 20 units in the morning and 10 units at bedtime     lisinopril (ZESTRIL) 30 MG tablet Take 30 mg by mouth daily.     Omega-3 Fatty Acids (FISH OIL) 1200 MG CAPS Take 1 capsule by mouth 2 (two) times daily.     traMADol (ULTRAM) 50 MG tablet Take 1 tablet (50 mg total) by mouth every 6 (six) hours as needed for moderate pain. 20 tablet 0   vitamin B-12 (CYANOCOBALAMIN) 1000 MCG tablet Take 1,000 mcg by mouth daily.     No current facility-administered medications for this visit.    Allergies: Allergies  Allergen Reactions   Quinapril Other (See Comments)    Reaction:  Elevated potassium    Venlafaxine Other (See Comments)    Reaction:  GI upset    Zetia [Ezetimibe] Nausea And Vomiting    Past Medical History:  Diagnosis Date   Chronic combined systolic (congestive) and diastolic (congestive) heart failure (HCC)    a. 09/2015 Echo: normal EF 55%; b. 07/2017 Echo: EF 25%, Gr2 DD, mild lvh, mid ant/inf/infsept/apical AK. Triv MR. mildly to mod dil LA.   CKD (chronic kidney disease), stage III (HCC)    COPD (chronic obstructive pulmonary disease) (HCC)    Not on home o2   Diabetes mellitus without complication (HCC)    On Levemir   Non-ischemic cardiomyopathy (HCC)    a.  07/2017 Echo: EF 25%, Gr2 DD - ? takotsubo.   Non-obstructive CAD (coronary artery disease)    a. 07/2017 Cath: LM nl, LAD 40ost, D1 50, D2 40, LCX 68m, OM1/2 nl, RCA large, nl.   Syncope    a.  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   Past Surgical History:  Procedure Laterality Date   APPENDECTOMY     ESOPHAGOGASTRODUODENOSCOPY N/A 11/27/2015   Procedure: ESOPHAGOGASTRODUODENOSCOPY (EGD);  Surgeon: Christena Deem, MD;  Location: [facility] ENDOSCOPY;  Service: Endoscopy;  Laterality: N/A;   KYPHOPLASTY N/A 02/21/2019   Procedure: KYPHOPLASTY L4 - DIABETIC;  Surgeon: Kennedy Bucker, MD;  Location: [facility] ORS;  Service: Orthopedics;  Laterality: N/A;   LEFT HEART CATH AND CORONARY ANGIOGRAPHY N/A 07/21/2017   Procedure: LEFT HEART CATH AND CORONARY ANGIOGRAPHY;  Surgeon: Yvonne Kendall, MD;  Location: [facility] INVASIVE CV LAB;  Service: Cardiovascular;  Laterality: N/A;   PACEMAKER INSERTION     TONSILLECTOMY     TUBAL LIGATION     Family History  Problem Relation Age of Onset   Kidney cancer Father    Esophageal cancer Brother    Social History   Socioeconomic History   Marital status: Married    Spouse name: Not on file   Number of children: Not on file   Years of education: Not on file   Highest education level: Not on file  Occupational History   Not on file  Tobacco Use   Smoking status: Former    Current packs/day: 0.00    Types: Cigarettes    Quit date: 11/24/2005    Years since quitting: 17.3   Smokeless tobacco: Never   Tobacco comments:    Quit 10 years ago, but has 45 years smoking prior to that  Vaping Use   Vaping status: Never Used  Substance and Sexual Activity   Alcohol use: No    Alcohol/week: 0.0 standard drinks of alcohol   Drug use: No   Sexual activity: Not Currently  Other Topics Concern   Not on file  Social History Narrative   Lives at home with husband.   Social Determinants of Health   Financial Resource Strain: Not on file   Food Insecurity: No Food Insecurity (04/01/2023)   Hunger Vital Sign    Worried About Running Out of Food in the Last Year: Never true    Ran Out of Food in the Last Year: Never true  Transportation Needs: No Transportation Needs (04/01/2023)   PRAPARE - Administrator, Civil Service (Medical): No    Lack of Transportation (Non-Medical): No  Physical Activity: Not on file  Stress: Not on file  Social Connections: Not on file  Intimate Partner Violence: Not At Risk (04/01/2023)   Humiliation, Afraid, Rape, and Kick questionnaire    Fear of Current or Ex-Partner: No  Emotionally Abused: No    Physically Abused: No    Sexually Abused: No    SUBJECTIVE  Review of Systems Constitutional: Patient denies any unintentional weight loss or change in strength lntegumentary: Patient denies any rashes or pruritus ENT: Patient reports dry mouth Cardiovascular: Patient denies chest pain or syncope Respiratory: Patient denies shortness of breath Gastrointestinal: Patient denies nausea, vomiting, constipation, or diarrhea Musculoskeletal: Patient denies muscle cramps or weakness Neurologic: Patient denies convulsions or seizures Psychiatric: Patient denies memory problems Allergic/Immunologic: Patient denies recent allergic reaction(s) Hematologic/Lymphatic: Patient denies bleeding tendencies Endocrine: Patient denies heat/cold intolerance  GU: As per HPI.  OBJECTIVE Vitals:   04/12/23 1128  BP: (!) 141/77  Pulse: 72  Temp: (!) 97.5 F (36.4 C)   There is no height or weight on file to calculate BMI.  Physical Examination  Constitutional: No obvious distress; patient is non-toxic appearing  Cardiovascular: No visible lower extremity edema.  Respiratory: The patient does not have audible wheezing/stridor; respirations do not appear labored  Gastrointestinal: Abdomen non-distended Musculoskeletal: Normal ROM of UEs  Skin: No obvious rashes/open sores  Neurologic: CN  2-12 grossly intact Psychiatric: Answered questions appropriately with normal affect  Hematologic/Lymphatic/Immunologic: No obvious bruises or sites of spontaneous bleeding  UA: negative PVR: 0 ml  ASSESSMENT Staghorn renal calculus  Left flank pain  Kidney stones  Hospital discharge follow-up  We reviewed recent history and imaging results. We discussed various stone management options including: extracorporeal shock wave lithotripsy (ESWL), ureteroscopic stone manipulation (URS), and percutaneous nephrolithotomy (PCNL). We discussed possible risks and benefits of each option including but not limited to: including pain, infection, sepsis, UTI, ureter perforation, need for stenting, post-op ureteral stricture, hematuria, anesthesia complications.   Will consult with Dr. Ronne Binning and will plan to notify patient of his recommendations.  Pt verbalized understanding and agreement. All questions were answered.   PLAN Advised the following: 1. Follow up to be determined as per Dr. Dimas Millin recommendations.   No orders of the defined types were placed in this encounter.  Total time spent caring for the patient today was over 45 minutes. This includes time spent on the date of the visit reviewing the patient's chart before the visit, time spent during the visit, and time spent after the visit on documentation. Over 50% of that time was spent in face-to-face time with this patient for direct counseling. E&M based on time and complexity of medical decision making.  It has been explained that the patient is to follow regularly with their PCP in addition to all other providers involved in their care and to follow instructions provided by these respective offices. Patient advised to contact urology clinic if any urologic-pertaining questions, concerns, new symptoms or problems arise in the interim period.  There are no Patient Instructions on file for this visit.  Electronically signed  by: Donnita Falls, MSN, FNP-C, CUNP 04/12/2023 1:07 PM

## 2023-04-12 ENCOUNTER — Encounter: Payer: Self-pay | Admitting: Urology

## 2023-04-12 ENCOUNTER — Ambulatory Visit (INDEPENDENT_AMBULATORY_CARE_PROVIDER_SITE_OTHER): Payer: Medicare Other | Admitting: Urology

## 2023-04-12 VITALS — BP 141/77 | HR 72 | Temp 97.5°F

## 2023-04-12 DIAGNOSIS — R109 Unspecified abdominal pain: Secondary | ICD-10-CM

## 2023-04-12 DIAGNOSIS — Z09 Encounter for follow-up examination after completed treatment for conditions other than malignant neoplasm: Secondary | ICD-10-CM

## 2023-04-12 DIAGNOSIS — N200 Calculus of kidney: Secondary | ICD-10-CM | POA: Diagnosis not present

## 2023-04-12 LAB — URINALYSIS, ROUTINE W REFLEX MICROSCOPIC
Bilirubin, UA: NEGATIVE
Glucose, UA: NEGATIVE
Ketones, UA: NEGATIVE
Leukocytes,UA: NEGATIVE
Nitrite, UA: NEGATIVE
Protein,UA: NEGATIVE
RBC, UA: NEGATIVE
Specific Gravity, UA: 1.01 (ref 1.005–1.030)
Urobilinogen, Ur: 0.2 mg/dL (ref 0.2–1.0)
pH, UA: 5 (ref 5.0–7.5)

## 2023-04-12 LAB — BLADDER SCAN AMB NON-IMAGING: Scan Result: 0

## 2023-04-12 NOTE — Addendum Note (Signed)
Addended by: Christoper Fabian R on: 04/12/2023 01:50 PM   Modules accepted: Orders

## 2023-04-14 ENCOUNTER — Encounter: Payer: Self-pay | Admitting: Urology

## 2023-04-14 NOTE — Progress Notes (Signed)
04/14/2023: Spoke with patient via phone to notify her of the following. Consulted with Dr. Ronne Binning today about patient's desire for removal of her 1.8 cm x 1.9 cm left staghorn calculus due to persistent left flank pain. He advised referral to tertiary care medical facility such as [facility] or [facility] due to her significant medical comorbidities, which make her a high risk surgical candidate. Pt verbalized understanding; she plans to discuss it with her daughter / family and will then call our office with her decision so that referral can be placed. All questions were answered.  Evette Georges, MSN, FNP-C, [facility] Nurse Practitioner [facility] [place]

## 2023-04-18 ENCOUNTER — Other Ambulatory Visit: Payer: Self-pay | Admitting: Urology

## 2023-04-18 DIAGNOSIS — N200 Calculus of kidney: Secondary | ICD-10-CM

## 2023-04-18 DIAGNOSIS — R109 Unspecified abdominal pain: Secondary | ICD-10-CM

## 2023-04-18 NOTE — Progress Notes (Signed)
Referral placed to [facility] for 1.8 cm x 1.9 cm left staghorn kidney stone management due to medical comorbidities which make patient a high risk surgical candidate (as directed by Dr. Wilkie Aye).  Evette Georges, MSN, FNP-C, [facility] Nurse Practitioner [facility] [place]

## 2023-07-11 ENCOUNTER — Other Ambulatory Visit (HOSPITAL_COMMUNITY): Payer: Self-pay | Admitting: Internal Medicine

## 2023-07-11 ENCOUNTER — Ambulatory Visit (HOSPITAL_COMMUNITY)
Admission: RE | Admit: 2023-07-11 | Discharge: 2023-07-11 | Disposition: A | Discharge status: Home / Self Care | Payer: Medicare Other | Source: Ambulatory Visit | Attending: Internal Medicine | Admitting: Internal Medicine

## 2023-07-11 DIAGNOSIS — M79671 Pain in right foot: Secondary | ICD-10-CM
